# Patient Record
Sex: Female | Born: 1975
Health system: Southern US, Community
[De-identification: ages and names within clinical notes are randomized; demographics above are authoritative.]

## PROBLEM LIST (undated history)

## (undated) ENCOUNTER — Inpatient Hospital Stay: Admission: EM | Payer: Self-pay | Source: Home / Self Care

## (undated) DIAGNOSIS — E039 Hypothyroidism, unspecified: Secondary | ICD-10-CM

## (undated) DIAGNOSIS — E669 Obesity, unspecified: Secondary | ICD-10-CM

## (undated) DIAGNOSIS — M545 Low back pain, unspecified: Secondary | ICD-10-CM

## (undated) DIAGNOSIS — D649 Anemia, unspecified: Secondary | ICD-10-CM

## (undated) DIAGNOSIS — I669 Occlusion and stenosis of unspecified cerebral artery: Secondary | ICD-10-CM

## (undated) HISTORY — DX: Obesity, unspecified: E66.9

## (undated) HISTORY — PX: BREAST BIOPSY: SHX20

## (undated) HISTORY — DX: Low back pain: M54.5

## (undated) HISTORY — DX: Low back pain, unspecified: M54.50

## (undated) HISTORY — DX: Hypothyroidism, unspecified: E03.9

## (undated) HISTORY — DX: Anemia, unspecified: D64.9

## (undated) HISTORY — DX: Occlusion and stenosis of unspecified cerebral artery: I66.9

## (undated) HISTORY — PX: COLONOSCOPY: SHX174

---

## 2000-07-19 HISTORY — PX: OTHER SURGICAL HISTORY: SHX169

## 2002-07-19 DIAGNOSIS — I669 Occlusion and stenosis of unspecified cerebral artery: Secondary | ICD-10-CM

## 2002-07-19 HISTORY — PX: IVC FILTER PLACEMENT (ARMC HX): HXRAD1551

## 2002-07-19 HISTORY — DX: Occlusion and stenosis of unspecified cerebral artery: I66.9

## 2006-07-19 HISTORY — PX: OTHER SURGICAL HISTORY: SHX169

## 2016-03-15 ENCOUNTER — Encounter: Payer: Self-pay | Admitting: Hematology

## 2016-03-15 ENCOUNTER — Telehealth: Payer: Self-pay | Admitting: Hematology

## 2016-03-15 NOTE — Telephone Encounter (Signed)
Pt confirmed appt, completed in take, mailed new pt letter, faxed referring provider appt date/time

## 2016-04-06 ENCOUNTER — Telehealth: Payer: Self-pay | Admitting: Hematology

## 2016-04-06 NOTE — Telephone Encounter (Signed)
COVERING AP - NEW PT APPT MOVED FROM 9/20 TO 10/2. SPOKE WITH PATIENT SHE IS AWARE. CONFIRMED W/NEW PT NAVIGATOR OK TO MOVE.

## 2016-04-07 ENCOUNTER — Ambulatory Visit: Payer: Medicaid Other | Admitting: Hematology

## 2016-04-19 ENCOUNTER — Telehealth: Payer: Self-pay | Admitting: Hematology

## 2016-04-19 ENCOUNTER — Encounter: Payer: Self-pay | Admitting: Hematology

## 2016-04-19 ENCOUNTER — Ambulatory Visit (HOSPITAL_BASED_OUTPATIENT_CLINIC_OR_DEPARTMENT_OTHER): Payer: Medicaid Other | Admitting: Hematology

## 2016-04-19 ENCOUNTER — Ambulatory Visit (HOSPITAL_BASED_OUTPATIENT_CLINIC_OR_DEPARTMENT_OTHER): Payer: Medicaid Other

## 2016-04-19 VITALS — BP 105/61 | HR 84 | Temp 98.2°F | Resp 18 | Wt 295.0 lb

## 2016-04-19 DIAGNOSIS — D509 Iron deficiency anemia, unspecified: Secondary | ICD-10-CM

## 2016-04-19 DIAGNOSIS — F411 Generalized anxiety disorder: Secondary | ICD-10-CM

## 2016-04-19 DIAGNOSIS — D693 Immune thrombocytopenic purpura: Secondary | ICD-10-CM

## 2016-04-19 DIAGNOSIS — Z86711 Personal history of pulmonary embolism: Secondary | ICD-10-CM

## 2016-04-19 DIAGNOSIS — E538 Deficiency of other specified B group vitamins: Secondary | ICD-10-CM

## 2016-04-19 DIAGNOSIS — D696 Thrombocytopenia, unspecified: Secondary | ICD-10-CM | POA: Insufficient documentation

## 2016-04-19 DIAGNOSIS — E039 Hypothyroidism, unspecified: Secondary | ICD-10-CM | POA: Insufficient documentation

## 2016-04-19 DIAGNOSIS — M1990 Unspecified osteoarthritis, unspecified site: Secondary | ICD-10-CM | POA: Insufficient documentation

## 2016-04-19 LAB — COMPREHENSIVE METABOLIC PANEL
ALBUMIN: 3.5 g/dL (ref 3.5–5.0)
ALT: 21 U/L (ref 0–55)
AST: 18 U/L (ref 5–34)
Alkaline Phosphatase: 103 U/L (ref 40–150)
Anion Gap: 10 mEq/L (ref 3–11)
BUN: 10.6 mg/dL (ref 7.0–26.0)
CALCIUM: 9.2 mg/dL (ref 8.4–10.4)
CHLORIDE: 106 meq/L (ref 98–109)
CO2: 24 mEq/L (ref 22–29)
CREATININE: 0.7 mg/dL (ref 0.6–1.1)
EGFR: >90 mL/min/{1.73_m2} (ref >90)
GLUCOSE: 75 mg/dL (ref 70–140)
POTASSIUM: 3.9 meq/L (ref 3.5–5.1)
SODIUM: 140 meq/L (ref 136–145)
Total Bilirubin: 0.44 mg/dL (ref 0.20–1.20)
Total Protein: 7.9 g/dL (ref 6.4–8.3)

## 2016-04-19 LAB — CBC & DIFF AND RETIC
BASO%: 0.2 % (ref 0.0–2.0)
BASOS ABS: 0 10*3/uL (ref 0.0–0.1)
EOS%: 1.1 % (ref 0.0–7.0)
Eosinophils Absolute: 0.1 10*3/uL (ref 0.0–0.5)
HCT: 32.9 % — ABNORMAL LOW (ref 34.8–46.6)
HGB: 11.1 g/dL — ABNORMAL LOW (ref 11.6–15.9)
Immature Retic Fract: 7 % (ref 1.60–10.00)
LYMPH#: 1.6 10*3/uL (ref 0.9–3.3)
LYMPH%: 25 % (ref 14.0–49.7)
MCH: 24.4 pg — ABNORMAL LOW (ref 25.1–34.0)
MCHC: 33.7 g/dL (ref 31.5–36.0)
MCV: 72.3 fL — ABNORMAL LOW (ref 79.5–101.0)
MONO#: 0.5 10*3/uL (ref 0.1–0.9)
MONO%: 7.9 % (ref 0.0–14.0)
NEUT%: 65.8 % (ref 38.4–76.8)
NEUTROS ABS: 4.1 10*3/uL (ref 1.5–6.5)
Platelets: 33 10*3/uL — ABNORMAL LOW (ref 145–400)
RBC: 4.55 10*6/uL (ref 3.70–5.45)
RDW: 14 % (ref 11.2–14.5)
RETIC %: 1.35 % (ref 0.70–2.10)
RETIC CT ABS: 61.43 10*3/uL (ref 33.70–90.70)
WBC: 6.2 10*3/uL (ref 3.9–10.3)

## 2016-04-19 LAB — CHCC SMEAR

## 2016-04-19 MED ORDER — ELTROMBOPAG OLAMINE 25 MG PO TABS: 25.0000 mg | ORAL_TABLET | Freq: Every day | ORAL | 1 refills | Status: DC

## 2016-04-19 MED ORDER — LEVOTHYROXINE SODIUM 100 MCG PO TABS: 100.0000 ug | ORAL_TABLET | Freq: Every day | ORAL | 1 refills | Status: DC

## 2016-04-19 NOTE — Telephone Encounter (Signed)
GAVE PATIENT AVS REPORT AND APPOINTMENTS FOR October  °

## 2016-04-19 NOTE — Progress Notes (Signed)
HEMATOLOGY/ONCOLOGY CONSULTATION NOTE  Date of Service: 04/19/2016  PCP: [facility] COMPLAINTS/PURPOSE OF CONSULTATION:  Chronic ITP  HISTORY OF PRESENTING ILLNESS:   Kiara Barrera is a wonderful 40 y.o. 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  Patient was been managed for her chronic ITP by Dr Yvone Neu at [facility] in [place], [place] and recently moved to [place] and wants to establish care here.  As per review of outside records the patient was diagnosed with ITP since 2012 in [place] and has been treated with prednisone on off since then. She apparently had significant weight gain with prednisone and had chronically low platelets despite the prednisone. She was then started on Promacta 50 mg by mouth daily in June 2016 and had a very good response with platelet counts of more than 500k. Her Promacta was discontinued in July 2016. She had relapse of her ITP in September 2016 with platelet count was down to 49k any significant bleeding or bruising. He has been on variable doses of Promacta since then and reports that she is currently taking 25 mg on Monday Wednesday and Friday.  He has had previous iron deficiency due to heavy periods which was treated with oral iron replacement.  MEDICAL HISTORY:  #1 chronic ITP #2 Iron deficiency anemia due to heavy periods #3 hypothyroidism #4 degenerative arthritis #5 anxiety disorder #6 history of pulmonary embolism in 2004 status post IVC filter placement. 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 #7 migraine headaches  SURGICAL HISTORY: Past Surgical History:  Procedure Laterality Date  . C secton  2002  . IVC FILTER PLACEMENT ([place] HX)  2004  . Left axillary  lymph node excision biopsy  2008    SOCIAL HISTORY: Social History   Social History  . Marital status: Single    Spouse name: N/A  . Number of children: N/A  . Years of education: N/A   Occupational History  . Not on file.   Social History Main Topics  . Smoking status: Never Smoker  . Smokeless tobacco: Never Used  . Alcohol use Yes     Comment: occasional: not that often  . Drug use: No  . Sexual activity: Not on file   Other Topics Concern  . Not on file   Social History Narrative  . No narrative on file  Recently moved from [place] , [place] to [place]. Nonsmoker minimal social alcohol use no drug use Currently unemployed Has a 7 year old daughter  FAMILY HISTORY: No known history of blood disorders Mother had a history of cervical cancer  maternal grandmother diabetes type 2 Maternal uncle diabetes type 2  ALLERGIES:  has No Known Allergies.  MEDICATIONS:  Current Outpatient Prescriptions  Medication Sig Dispense Refill  . Biotin 10000 MCG TABS Take by mouth.    . Calcium Carb-Cholecalciferol (CALCIUM + D3) 600-800 MG-UNIT TABS Take by mouth.    . Cholecalciferol (VITAMIN D3) 2000 units TABS Take by mouth.    . eltrombopag (PROMACTA) 25 MG tablet Take 25 mg by mouth daily. Take on an empty stomach, 1 hour before a meal or 2 hours after.    . IRON PO Take 65 mg by mouth.    . Multiple Vitamin (MULTIVITAMIN)  tablet Take 1 tablet by mouth daily.    Marland Kitchen levothyroxine (SYNTHROID, LEVOTHROID) 100 MCG tablet TK 1 T PO QD  0   No current facility-administered medications for this visit.     REVIEW OF SYSTEMS:    10 Point review of Systems was done is negative except as noted above.  PHYSICAL EXAMINATION: ECOG PERFORMANCE STATUS: 1 - Symptomatic but completely ambulatory  . Vitals:   04/19/16 1259  BP: 105/61  Pulse: 84  Resp: 18  Temp: 98.2 F (36.8 C)   Filed Weights   04/19/16 1259  Weight: 295 lb (133.8 kg)   .There is no height or weight  on file to calculate BMI.  GENERAL:alert, in no acute distress and comfortable SKIN: skin color, texture, turgor are normal, no rashes or significant lesions EYES: normal, conjunctiva are pink and non-injected, sclera clear OROPHARYNX:no exudate, no erythema and lips, buccal mucosa, and tongue normal  NECK: supple, no JVD, thyroid normal size, non-tender, without nodularity LYMPH:  no palpable lymphadenopathy in the cervical, axillary or inguinal LUNGS: clear to auscultation with normal respiratory effort HEART: regular rate & rhythm,  no murmurs and no lower extremity edema ABDOMEN: abdomen soft, non-tender, normoactive bowel sounds  Musculoskeletal: no cyanosis of digits and no clubbing  PSYCH: alert & oriented x 3 with fluent speech NEURO: no focal motor/sensory deficits  LABORATORY DATA:  I have reviewed the data as listed  Component     Latest Ref Rng & Units 04/19/2016  WBC     3.9 - 10.3 10e3/uL 6.2  NEUT#     1.5 - 6.5 10e3/uL 4.1  Hemoglobin     11.6 - 15.9 g/dL 11.1 (L)  HCT     34.8 - 46.6 % 32.9 (L)  Platelets     145 - 400 10e3/uL 33 Large platelets present (L)  MCV     79.5 - 101.0 fL 72.3 (L)  MCH     25.1 - 34.0 pg 24.4 (L)  MCHC     31.5 - 36.0 g/dL 33.7  RBC     3.70 - 5.45 10e6/uL 4.55  RDW     11.2 - 14.5 % 14.0  lymph#     0.9 - 3.3 10e3/uL 1.6  MONO#     0.1 - 0.9 10e3/uL 0.5  Eosinophils Absolute     0.0 - 0.5 10e3/uL 0.1  Basophils Absolute     0.0 - 0.1 10e3/uL 0.0  NEUT%     38.4 - 76.8 % 65.8  LYMPH%     14.0 - 49.7 % 25.0  MONO%     0.0 - 14.0 % 7.9  EOS%     0.0 - 7.0 % 1.1  BASO%     0.0 - 2.0 % 0.2  Retic %     0.70 - 2.10 % 1.35  Retic Ct Abs     33.70 - 90.70 10e3/uL 61.43  Immature Retic Fract     1.60 - 10.00 % 7.00  Sodium     136 - 145 mEq/L 140  Potassium     3.5 - 5.1 mEq/L 3.9  Chloride     98 - 109 mEq/L 106  CO2     22 - 29 mEq/L 24  Glucose     70 - 140 mg/dl 75  BUN     7.0 - 26.0 mg/dL 10.6    Creatinine     0.6 - 1.1 mg/dL 0.7  Total Bilirubin     0.20 -  1.20 mg/dL 0.44  Alkaline Phosphatase     40 - 150 U/L 103  AST     5 - 34 U/L 18  ALT     0 - 55 U/L 21  Total Protein     6.4 - 8.3 g/dL 7.9  Albumin     3.5 - 5.0 g/dL 3.5  Calcium     8.4 - 10.4 mg/dL 9.2  Anion gap     3 - 11 mEq/L 10  EGFR     >90 ml/min/1.73 m2 >90  Iron     41 - 142 ug/dL 27 (L)  TIBC     236 - 444 ug/dL 430  UIBC     120 - 384 ug/dL 403 (H)  %SAT     21 - 57 % 6 (L)  Ferritin     9 - 269 ng/ml 30  Vitamin B12     211 - 946 pg/mL 623  Technologist Review          RADIOGRAPHIC STUDIES: I have personally reviewed the radiological images as listed and agreed with the findings in the report. No results found.  ASSESSMENT & PLAN:   40 year old African-American female with   1) Chronic ITP since 2012. Patient has been on and off steroids and has been on promacta since 2016. Her platelets today are down to 33k. Was was apparently on Promacta 98m po MWF. No issues with bleeding at this time. No petechiae B12 wnl Plan -absolutely avoid NSAIDS, ASA and similar products -given prescription for promacta 272mpo daily. - if still low might need to consider other treatments including Rituxan or splenectomy.  2) Iron deficiency Anemia - likely from heavy menstrual losses. Plan -maintain platelet count >50k -continue po Iron replacement. -might need to consider IV iron if worsening anemia.  3) Hypothyroidism - on levothyroxine -given refill on her prescription since she does not yet have a PCP.  4) . Patient Active Problem List   Diagnosis Date Noted  . Chronic ITP (idiopathic thrombocytopenia) (HCC) 05/03/2016  . Thrombocytopenia ([facility]/08/2015  . Iron deficiency anemia 04/19/2016  . Hypothyroidism 04/19/2016  . Degenerative arthritis 04/19/2016  . Generalized anxiety disorder 04/19/2016   - she has been advised to get a PCP ASAP for management of all her other  medical cares.  5) Obesity  -discussed lifestyle modifications including need for appropriate diet and exercise.  6) H/o PE in 2004-- still has IVC filter in situ. Thought to be triggered by immobility. -not on anticoagulation at this time.  RTC with Dr KaIrene Limbon 2weeks with rpt labs   All of the patients questions were answered with apparent satisfaction. The patient knows to call the clinic with any problems, questions or concerns.  I spent 50 minutes counseling the patient face to face. The total time spent in the appointment was 60 minutes and more than 50% was on counseling and direct patient cares.    GaSullivan LoneD MSBlue SkyAHIVMS SCGuadalupe County HospitalTBeacon Orthopaedics Surgery Centerematology/Oncology Physician CoPacific Northwest Urology Surgery Center(Office):       [phone] cell):  [phone]):           [phone]/08/2015 2:40 PM

## 2016-04-20 LAB — IRON AND TIBC
%SAT: 6 % — AB (ref 21–57)
Iron: 27 ug/dL — ABNORMAL LOW (ref 41–142)
TIBC: 430 ug/dL (ref 236–444)
UIBC: 403 ug/dL — AB (ref 120–384)

## 2016-04-20 LAB — VITAMIN B12: VITAMIN B 12: 623 pg/mL (ref 211–946)

## 2016-04-20 LAB — FERRITIN: Ferritin: 30 ng/ml (ref 9–269)

## 2016-05-03 ENCOUNTER — Ambulatory Visit (HOSPITAL_BASED_OUTPATIENT_CLINIC_OR_DEPARTMENT_OTHER): Payer: Medicaid Other | Admitting: Hematology

## 2016-05-03 ENCOUNTER — Other Ambulatory Visit (HOSPITAL_BASED_OUTPATIENT_CLINIC_OR_DEPARTMENT_OTHER): Payer: Medicaid Other

## 2016-05-03 ENCOUNTER — Encounter: Payer: Self-pay | Admitting: Hematology

## 2016-05-03 ENCOUNTER — Other Ambulatory Visit: Payer: Self-pay | Admitting: *Deleted

## 2016-05-03 ENCOUNTER — Ambulatory Visit: Payer: Medicaid Other

## 2016-05-03 VITALS — BP 102/49 | HR 98 | Temp 97.6°F | Resp 18 | Ht 62.0 in | Wt 301.1 lb

## 2016-05-03 DIAGNOSIS — D6959 Other secondary thrombocytopenia: Secondary | ICD-10-CM

## 2016-05-03 DIAGNOSIS — D509 Iron deficiency anemia, unspecified: Secondary | ICD-10-CM

## 2016-05-03 DIAGNOSIS — D693 Immune thrombocytopenic purpura: Secondary | ICD-10-CM

## 2016-05-03 LAB — CBC & DIFF AND RETIC
BASO%: 0.3 % (ref 0.0–2.0)
Basophils Absolute: 0 10*3/uL (ref 0.0–0.1)
EOS%: 0.7 % (ref 0.0–7.0)
Eosinophils Absolute: 0 10*3/uL (ref 0.0–0.5)
HCT: 32 % — ABNORMAL LOW (ref 34.8–46.6)
HGB: 10.1 g/dL — ABNORMAL LOW (ref 11.6–15.9)
IMMATURE RETIC FRACT: 10.2 % — AB (ref 1.60–10.00)
LYMPH#: 1.7 10*3/uL (ref 0.9–3.3)
LYMPH%: 24.8 % (ref 14.0–49.7)
MCH: 23.4 pg — ABNORMAL LOW (ref 25.1–34.0)
MCHC: 31.7 g/dL (ref 31.5–36.0)
MCV: 73.6 fL — AB (ref 79.5–101.0)
MONO#: 0.5 10*3/uL (ref 0.1–0.9)
MONO%: 7.1 % (ref 0.0–14.0)
NEUT%: 67.1 % (ref 38.4–76.8)
NEUTROS ABS: 4.7 10*3/uL (ref 1.5–6.5)
Platelets: 8 10*3/uL — CL (ref 145–400)
RBC: 4.34 10*6/uL (ref 3.70–5.45)
RDW: 15.4 % — ABNORMAL HIGH (ref 11.2–14.5)
RETIC CT ABS: 82.03 10*3/uL (ref 33.70–90.70)
Retic %: 1.89 % (ref 0.70–2.10)
WBC: 7 10*3/uL (ref 3.9–10.3)

## 2016-05-03 LAB — COMPREHENSIVE METABOLIC PANEL
ALT: 25 U/L (ref 0–55)
AST: 24 U/L (ref 5–34)
Albumin: 3.3 g/dL — ABNORMAL LOW (ref 3.5–5.0)
Alkaline Phosphatase: 93 U/L (ref 40–150)
Anion Gap: 10 mEq/L (ref 3–11)
BILIRUBIN TOTAL: 0.31 mg/dL (ref 0.20–1.20)
BUN: 13.4 mg/dL (ref 7.0–26.0)
CHLORIDE: 106 meq/L (ref 98–109)
CO2: 24 meq/L (ref 22–29)
Calcium: 8.8 mg/dL (ref 8.4–10.4)
Creatinine: 0.7 mg/dL (ref 0.6–1.1)
GLUCOSE: 81 mg/dL (ref 70–140)
Potassium: 4 mEq/L (ref 3.5–5.1)
SODIUM: 139 meq/L (ref 136–145)
TOTAL PROTEIN: 7.4 g/dL (ref 6.4–8.3)

## 2016-05-03 LAB — IRON AND TIBC
%SAT: 7 % — ABNORMAL LOW (ref 21–57)
Iron: 30 ug/dL — ABNORMAL LOW (ref 41–142)
TIBC: 433 ug/dL (ref 236–444)
UIBC: 402 ug/dL — AB (ref 120–384)

## 2016-05-03 LAB — TECHNOLOGIST REVIEW

## 2016-05-03 LAB — FERRITIN: FERRITIN: 41 ng/mL (ref 9–269)

## 2016-05-03 MED ORDER — DEXAMETHASONE 4 MG PO TABS: 40.0000 mg | ORAL_TABLET | Freq: Every day | ORAL | 0 refills | Status: AC

## 2016-05-03 MED ORDER — ROMIPLOSTIM INJECTION 500 MCG
2.0000 ug/kg | Freq: Once | SUBCUTANEOUS | Status: AC
Start: 2016-05-03 — End: 2016-05-03
  Administered 2016-05-03: 275 ug via SUBCUTANEOUS
  Filled 2016-05-03: qty 0.55

## 2016-05-03 NOTE — Progress Notes (Signed)
Spoke with Otho Perl (authorization specialist). Luann informed RN that Nplate does not need to be authorized with Medicaid.

## 2016-05-03 NOTE — Progress Notes (Signed)
HEMATOLOGY/ONCOLOGY CLINIC NOTE  Date of Service: 05/05/2016  PCP: [facility] COMPLAINTS/PURPOSE OF CONSULTATION:  Chronic ITP now with relapse  HISTORY OF PRESENTING ILLNESS:   Alliannah Moskovitz is a wonderful 40 y.o. female who has been referred to [place] by [facility] for evaluation and management of Chronic ITP.  Patient was been managed for her chronic ITP by Dr Yvone Neu at [facility] in [place], [place] and recently moved to [place] and wants to establish care here.  As per review of outside records the patient was diagnosed with ITP since 2012 in [place] and has been treated with prednisone on off since then. She apparently had significant weight gain with prednisone and had chronically low platelets despite the prednisone. She was then started on Promacta 50 mg by mouth daily in June 2016 and had a very good response with platelet counts of more than 500k. Her Promacta was discontinued in July 2016. She had relapse of her ITP in September 2016 with platelet count was down to 49k any significant bleeding or bruising. He has been on variable doses of Promacta since then and reports that she is currently taking 25 mg on Monday Wednesday and Friday.  He has had previous iron deficiency due to heavy periods which was treated with oral iron replacement.  INTERVAL HISTORY  Ms Minervini is here for followup of her ITP. She notes no issues with bleeding. She reports that due to insurance issues she was unable to get the promacta refilled yet and was using only 25mg  po MWF as per previous orders and not 25mg  po daily as we ordered. Her platelet counts are down to 8K today. No overt bleeding. No headaches. Notes that she is just starting to have her periods. We discussed treatment options for her severe relapse and she was offered hospitalization for acute rx of severe ITP  relapse with IVIG. She refused admission since her minor 27yr daughter is home alone.  MEDICAL HISTORY:  #1 chronic ITP #2 Iron deficiency anemia due to heavy periods #3 hypothyroidism #4 degenerative arthritis #5 anxiety disorder #6 history of pulmonary embolism in 2004 status post IVC filter placement. 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 #7 migraine headaches  SURGICAL HISTORY: Past Surgical History:  Procedure Laterality Date  . C secton  2002  . IVC FILTER PLACEMENT ([place] HX)  2004  . Left axillary lymph node excision biopsy  2008    SOCIAL HISTORY: Social History   Social History  . Marital status: Single    Spouse name: N/A  . Number of children: N/A  . Years of education: N/A   Occupational History  . Not on file.   Social History Main Topics  . Smoking status: Never Smoker  . Smokeless tobacco: Never Used  . Alcohol use Yes     Comment: occasional: not that often  . Drug use: No  . Sexual activity: Not on file   Other Topics Concern  . Not on file   Social History Narrative  . No narrative on file  Recently moved from [place] , [place] to [place]. Nonsmoker minimal social alcohol use no drug use Currently unemployed Has a 4 year old daughter  FAMILY HISTORY: No known history of blood disorders Mother had a history of cervical cancer  maternal grandmother diabetes type 2 Maternal uncle diabetes type 2  ALLERGIES:  has No Known Allergies.  MEDICATIONS:  Current Outpatient Prescriptions  Medication Sig Dispense Refill  . dexamethasone (DECADRON) 4 MG tablet Take 10 tablets (40 mg total) by mouth daily. Take with breakfast not on empty stomach 40 tablet 0  . eltrombopag (PROMACTA) 25 MG tablet Take 1 tablet (25 mg total) by mouth daily. Take on an empty stomach, 1 hour before a meal or 2 hours after. 30 tablet 1  . IRON PO Take 65 mg by  mouth.    . levothyroxine (SYNTHROID, LEVOTHROID) 100 MCG tablet Take 1 tablet (100 mcg total) by mouth daily before breakfast. 30 tablet 1  . Multiple Vitamin (MULTIVITAMIN) tablet Take 1 tablet by mouth daily.     No current facility-administered medications for this visit.     REVIEW OF SYSTEMS:    10 Point review of Systems was done is negative except as noted above.  PHYSICAL EXAMINATION: ECOG PERFORMANCE STATUS: 1 - Symptomatic but completely ambulatory  . Vitals:   05/03/16 0952  BP: (!) 102/49  Pulse: 98  Resp: 18  Temp: 97.6 F (36.4 C)   Filed Weights   05/03/16 0952  Weight: (!) 301 lb 1.6 oz (136.6 kg)   .Body mass index is 55.07 kg/m.  GENERAL:alert, in no acute distress and comfortable SKIN: skin color, texture, turgor are normal, no rashes or significant lesions EYES: normal, conjunctiva are pink and non-injected, sclera clear OROPHARYNX:no exudate, no erythema and lips, buccal mucosa, and tongue normal  NECK: supple, no JVD, thyroid normal size, non-tender, without nodularity LYMPH:  no palpable lymphadenopathy in the cervical, axillary or inguinal LUNGS: clear to auscultation with normal respiratory effort HEART: regular rate & rhythm,  no murmurs and no lower extremity edema ABDOMEN: abdomen soft, non-tender, normoactive bowel sounds  Musculoskeletal: no cyanosis of digits and no clubbing  PSYCH: alert & oriented x 3 with fluent speech NEURO: no focal motor/sensory deficits  LABORATORY DATA:  I have reviewed the data as listed  . CBC Latest Ref Rng & Units 05/03/2016 04/19/2016  WBC 3.9 - 10.3 10e3/uL 7.0 6.2  Hemoglobin 11.6 - 15.9 g/dL 10.1(L) 11.1(L)  Hematocrit 34.8 - 46.6 % 32.0(L) 32.9(L)  Platelets 145 - 400 10e3/uL 8 Large platelets present(LL) 33 Large platelets present(L)   . CMP Latest Ref Rng & Units 05/03/2016 04/19/2016  Glucose 70 - 140 mg/dl 81 75  BUN 7.0 - 26.0 mg/dL 13.4 10.6  Creatinine 0.6 - 1.1 mg/dL 0.7 0.7  Sodium 136 -  145 mEq/L 139 140  Potassium 3.5 - 5.1 mEq/L 4.0 3.9  CO2 22 - 29 mEq/L 24 24  Calcium 8.4 - 10.4 mg/dL 8.8 9.2  Total Protein 6.4 - 8.3 g/dL 7.4 7.9  Total Bilirubin 0.20 - 1.20 mg/dL 0.31 0.44  Alkaline Phos 40 - 150 U/L 93 103  AST 5 - 34 U/L 24 18  ALT 0 - 55 U/L 25 21    RADIOGRAPHIC STUDIES: I have personally reviewed the radiological images as listed and agreed with the findings in the report. No results found.  ASSESSMENT & PLAN:   40 year old African-American female with   1) Chronic ITP since 2012 now with acute relapse of her ITP with platelet counts down to 8k. No overt bleeding at this time. Patient has been on and off steroids and has been on promacta since 2016. Was was apparently on Promacta 25mg  po MWF. No issues with bleeding at this  time. No petechiae B12 wnl Plan -patient was recommended hospitalization for acute treatment of her severe ITP. She declined this options since her minor 39yo is home alone. -we was started on Dexamethasone 40mg  po daily x 4 days with breakfast. -we was setup and received Nplate 74mcg/kg today and setup for weekly doses. -she has been setup for outpatient IVIG on 05/11/216. Will cancel is platelets >30k. -absolutely avoid NSAIDS, ASA and similar products -hold off on promacta at this time might consider transition back to this if platelets stabilize. -patient recommended to seek immediate attention for any concerns of active bleeding since she might need platelet transfusion and other treatment. -we shall repeat cbc on Friday 05/07/2016. - if still low might need to consider other treatments including Rituxan or splenectomy. -[place] abd to check for liver pathology and spleen size.  2) Iron deficiency Anemia - likely from heavy menstrual losses. Plan -continue po Iron replacement. -might need to consider IV iron if worsening anemia.  3) Hypothyroidism - on levothyroxine  4) . Patient Active Problem List   Diagnosis Date Noted    . Chronic ITP (idiopathic thrombocytopenia) (HCC) 05/03/2016  . Thrombocytopenia ([facility]) 04/19/2016  . Iron deficiency anemia 04/19/2016  . Hypothyroidism 04/19/2016  . Degenerative arthritis 04/19/2016  . Generalized anxiety disorder 04/19/2016   - she has been advised to get a PCP ASAP for management of all her other medical cares.  5) Obesity  -discussed lifestyle modifications including need for appropriate diet and exercise.  6) H/o PE in 2004-- still has IVC filter in situ. Thought to be triggered by immobility. -not on anticoagulation at this time.  RTC with Dr Irene Limbo in 1week with rpt labs and labs only on 05/07/2016   All of the patients questions were answered with apparent satisfaction. The patient knows to call the clinic with any problems, questions or concerns.  I spent 30 minutes counseling the patient face to face. The total time spent in the appointment was 40 minutes and more than 50% was on counseling and direct patient cares.    Sullivan Lone MD Peter AAHIVMS [facility] Hematology/Oncology Physician [facility]  (Office):       [phone] (Work cell):  [phone] (Fax):           [phone]

## 2016-05-06 ENCOUNTER — Other Ambulatory Visit: Payer: Self-pay | Admitting: *Deleted

## 2016-05-06 DIAGNOSIS — D693 Immune thrombocytopenic purpura: Secondary | ICD-10-CM

## 2016-05-06 MED ORDER — ELTROMBOPAG OLAMINE 25 MG PO TABS: 25.0000 mg | ORAL_TABLET | Freq: Every day | ORAL | 1 refills | Status: DC

## 2016-05-07 ENCOUNTER — Other Ambulatory Visit (HOSPITAL_BASED_OUTPATIENT_CLINIC_OR_DEPARTMENT_OTHER): Payer: Medicaid Other

## 2016-05-07 DIAGNOSIS — D693 Immune thrombocytopenic purpura: Secondary | ICD-10-CM | POA: Diagnosis not present

## 2016-05-07 LAB — CBC & DIFF AND RETIC
BASO%: 0 % (ref 0.0–2.0)
BASOS ABS: 0 10*3/uL (ref 0.0–0.1)
EOS ABS: 0 10*3/uL (ref 0.0–0.5)
EOS%: 0 % (ref 0.0–7.0)
HCT: 30.4 % — ABNORMAL LOW (ref 34.8–46.6)
HEMOGLOBIN: 10.2 g/dL — AB (ref 11.6–15.9)
Immature Retic Fract: 14.9 % — ABNORMAL HIGH (ref 1.60–10.00)
LYMPH#: 1.6 10*3/uL (ref 0.9–3.3)
LYMPH%: 6.7 % — ABNORMAL LOW (ref 14.0–49.7)
MCH: 24.3 pg — AB (ref 25.1–34.0)
MCHC: 33.6 g/dL (ref 31.5–36.0)
MCV: 72.4 fL — ABNORMAL LOW (ref 79.5–101.0)
MONO#: 1.5 10*3/uL — AB (ref 0.1–0.9)
MONO%: 6 % (ref 0.0–14.0)
NEUT#: 21.3 10*3/uL — ABNORMAL HIGH (ref 1.5–6.5)
NEUT%: 87.3 % — ABNORMAL HIGH (ref 38.4–76.8)
NRBC: 0 % (ref 0–0)
Platelets: 285 10*3/uL (ref 145–400)
RBC: 4.2 10*6/uL (ref 3.70–5.45)
RDW: 15.9 % — AB (ref 11.2–14.5)
RETIC %: 3.42 % — AB (ref 0.70–2.10)
RETIC CT ABS: 143.64 10*3/uL — AB (ref 33.70–90.70)
WBC: 24.4 10*3/uL — ABNORMAL HIGH (ref 3.9–10.3)

## 2016-05-10 ENCOUNTER — Telehealth: Payer: Self-pay | Admitting: Hematology

## 2016-05-10 ENCOUNTER — Ambulatory Visit: Payer: Medicaid Other

## 2016-05-10 ENCOUNTER — Encounter: Payer: Self-pay | Admitting: *Deleted

## 2016-05-10 ENCOUNTER — Ambulatory Visit: Payer: Medicaid Other | Admitting: Family

## 2016-05-10 NOTE — Telephone Encounter (Signed)
Late documentation.  Patient given avs report and appointments for October and November prior to leaving 10/16.

## 2016-05-10 NOTE — Progress Notes (Signed)
RN informed patient of recent platelet count. Cancelled infusion for tomorrow. Informed patient that we will give NPlate if plt level is below 100 per MD Irene Limbo.

## 2016-05-11 ENCOUNTER — Ambulatory Visit: Payer: Medicaid Other

## 2016-05-11 ENCOUNTER — Other Ambulatory Visit: Payer: Medicaid Other

## 2016-05-17 ENCOUNTER — Ambulatory Visit: Payer: Medicaid Other | Admitting: Hematology

## 2016-05-17 ENCOUNTER — Ambulatory Visit (HOSPITAL_BASED_OUTPATIENT_CLINIC_OR_DEPARTMENT_OTHER): Payer: Medicaid Other

## 2016-05-17 ENCOUNTER — Other Ambulatory Visit (HOSPITAL_BASED_OUTPATIENT_CLINIC_OR_DEPARTMENT_OTHER): Payer: Medicaid Other

## 2016-05-17 ENCOUNTER — Other Ambulatory Visit: Payer: Self-pay | Admitting: *Deleted

## 2016-05-17 VITALS — BP 129/79 | HR 68 | Temp 97.6°F | Resp 20

## 2016-05-17 DIAGNOSIS — D693 Immune thrombocytopenic purpura: Secondary | ICD-10-CM | POA: Diagnosis present

## 2016-05-17 LAB — CBC & DIFF AND RETIC
BASO%: 0.1 % (ref 0.0–2.0)
Basophils Absolute: 0 10*3/uL (ref 0.0–0.1)
EOS ABS: 0.1 10*3/uL (ref 0.0–0.5)
EOS%: 0.4 % (ref 0.0–7.0)
HCT: 32.4 % — ABNORMAL LOW (ref 34.8–46.6)
HGB: 10.9 g/dL — ABNORMAL LOW (ref 11.6–15.9)
IMMATURE RETIC FRACT: 17.9 % — AB (ref 1.60–10.00)
LYMPH#: 2.4 10*3/uL (ref 0.9–3.3)
LYMPH%: 17.9 % (ref 14.0–49.7)
MCH: 24.2 pg — ABNORMAL LOW (ref 25.1–34.0)
MCHC: 33.6 g/dL (ref 31.5–36.0)
MCV: 72 fL — AB (ref 79.5–101.0)
MONO#: 0.9 10*3/uL (ref 0.1–0.9)
MONO%: 6.7 % (ref 0.0–14.0)
NEUT%: 74.9 % (ref 38.4–76.8)
NEUTROS ABS: 9.9 10*3/uL — AB (ref 1.5–6.5)
NRBC: 0 % (ref 0–0)
Platelets: 13 10*3/uL — ABNORMAL LOW (ref 145–400)
RBC: 4.5 10*6/uL (ref 3.70–5.45)
RDW: 15.8 % — AB (ref 11.2–14.5)
RETIC %: 1.88 % (ref 0.70–2.10)
RETIC CT ABS: 84.6 10*3/uL (ref 33.70–90.70)
WBC: 13.2 10*3/uL — AB (ref 3.9–10.3)

## 2016-05-17 MED ORDER — ROMIPLOSTIM INJECTION 500 MCG
410.0000 ug | Freq: Once | SUBCUTANEOUS | Status: AC
  Administered 2016-05-17: 410 ug via SUBCUTANEOUS
  Filled 2016-05-17: qty 0.82

## 2016-05-17 NOTE — Patient Instructions (Signed)
Romiplostim injection What is this medicine? ROMIPLOSTIM (roe mi PLOE stim) helps your body make more platelets. This medicine is used to treat low platelets caused by chronic idiopathic thrombocytopenic purpura (ITP). This medicine may be used for other purposes; ask your health care provider or pharmacist if you have questions. What should I tell my health care provider before I take this medicine? They need to know if you have any of these conditions: -cancer or myelodysplastic syndrome -low blood counts, like low white cell, platelet, or red cell counts -take medicines that treat or prevent blood clots -an unusual or allergic reaction to romiplostim, mannitol, other medicines, foods, dyes, or preservatives -pregnant or trying to get pregnant -breast-feeding How should I use this medicine? This medicine is for injection under the skin. It is given by a health care professional in a hospital or clinic setting. A special MedGuide will be given to you before your injection. Read this information carefully each time. Talk to your pediatrician regarding the use of this medicine in children. Special care may be needed. Overdosage: If you think you have taken too much of this medicine contact a poison control center or emergency room at once. NOTE: This medicine is only for you. Do not share this medicine with others. What if I miss a dose? It is important not to miss your dose. Call your doctor or health care professional if you are unable to keep an appointment. What may interact with this medicine? Interactions are not expected. This list may not describe all possible interactions. Give your health care provider a list of all the medicines, herbs, non-prescription drugs, or dietary supplements you use. Also tell them if you smoke, drink alcohol, or use illegal drugs. Some items may interact with your medicine. What should I watch for while using this medicine? Your condition will be monitored  carefully while you are receiving this medicine. Visit your prescriber or health care professional for regular checks on your progress and for the needed blood tests. It is important to keep all appointments. What side effects may I notice from receiving this medicine? Side effects that you should report to your doctor or health care professional as soon as possible: -allergic reactions like skin rash, itching or hives, swelling of the face, lips, or tongue -shortness of breath, chest pain, swelling in a leg -unusual bleeding or bruising Side effects that usually do not require medical attention (report to your doctor or health care professional if they continue or are bothersome): -dizziness -headache -muscle aches -pain in arms and legs -stomach pain -trouble sleeping This list may not describe all possible side effects. Call your doctor for medical advice about side effects. You may report side effects to FDA at 1-800-FDA-1088. Where should I keep my medicine? This drug is given in a hospital or clinic and will not be stored at home. NOTE: This sheet is a summary. It may not cover all possible information. If you have questions about this medicine, talk to your doctor, pharmacist, or health care provider.    2016, Elsevier/Gold Standard. (2008-03-04 15:13:04)  

## 2016-05-18 ENCOUNTER — Ambulatory Visit: Payer: Medicaid Other | Admitting: Hematology

## 2016-05-18 ENCOUNTER — Other Ambulatory Visit: Payer: Medicaid Other

## 2016-05-18 ENCOUNTER — Ambulatory Visit: Payer: Medicaid Other

## 2016-05-21 ENCOUNTER — Other Ambulatory Visit: Payer: Self-pay | Admitting: *Deleted

## 2016-05-21 DIAGNOSIS — D509 Iron deficiency anemia, unspecified: Secondary | ICD-10-CM

## 2016-05-24 ENCOUNTER — Ambulatory Visit (HOSPITAL_BASED_OUTPATIENT_CLINIC_OR_DEPARTMENT_OTHER): Payer: Medicaid Other | Admitting: Hematology

## 2016-05-24 ENCOUNTER — Other Ambulatory Visit: Payer: Self-pay | Admitting: *Deleted

## 2016-05-24 ENCOUNTER — Other Ambulatory Visit (HOSPITAL_BASED_OUTPATIENT_CLINIC_OR_DEPARTMENT_OTHER): Payer: Medicaid Other

## 2016-05-24 ENCOUNTER — Telehealth: Payer: Self-pay | Admitting: Hematology

## 2016-05-24 ENCOUNTER — Ambulatory Visit: Payer: Medicaid Other

## 2016-05-24 VITALS — BP 106/55 | HR 79 | Temp 97.7°F | Resp 18 | Ht 62.0 in | Wt 303.3 lb

## 2016-05-24 DIAGNOSIS — Z86711 Personal history of pulmonary embolism: Secondary | ICD-10-CM

## 2016-05-24 DIAGNOSIS — D693 Immune thrombocytopenic purpura: Secondary | ICD-10-CM

## 2016-05-24 DIAGNOSIS — D509 Iron deficiency anemia, unspecified: Secondary | ICD-10-CM

## 2016-05-24 LAB — COMPREHENSIVE METABOLIC PANEL
ALBUMIN: 3.1 g/dL — AB (ref 3.5–5.0)
ALK PHOS: 95 U/L (ref 40–150)
ALT: 18 U/L (ref 0–55)
AST: 18 U/L (ref 5–34)
Anion Gap: 9 mEq/L (ref 3–11)
BUN: 10.5 mg/dL (ref 7.0–26.0)
CALCIUM: 9 mg/dL (ref 8.4–10.4)
CHLORIDE: 109 meq/L (ref 98–109)
CO2: 22 mEq/L (ref 22–29)
CREATININE: 0.7 mg/dL (ref 0.6–1.1)
EGFR: >90 mL/min/{1.73_m2} (ref >90)
GLUCOSE: 86 mg/dL (ref 70–140)
Potassium: 4.1 mEq/L (ref 3.5–5.1)
Sodium: 140 mEq/L (ref 136–145)
Total Bilirubin: 0.29 mg/dL (ref 0.20–1.20)
Total Protein: 7.6 g/dL (ref 6.4–8.3)

## 2016-05-24 LAB — CBC WITH DIFFERENTIAL/PLATELET
BASO%: 0.4 % (ref 0.0–2.0)
BASOS ABS: 0 10*3/uL (ref 0.0–0.1)
EOS ABS: 0.1 10*3/uL (ref 0.0–0.5)
EOS%: 0.8 % (ref 0.0–7.0)
HCT: 33.8 % — ABNORMAL LOW (ref 34.8–46.6)
HEMOGLOBIN: 10.8 g/dL — AB (ref 11.6–15.9)
LYMPH%: 22.4 % (ref 14.0–49.7)
MCH: 23.4 pg — AB (ref 25.1–34.0)
MCHC: 31.9 g/dL (ref 31.5–36.0)
MCV: 73.4 fL — AB (ref 79.5–101.0)
MONO#: 0.4 10*3/uL (ref 0.1–0.9)
MONO%: 5.7 % (ref 0.0–14.0)
NEUT%: 70.7 % (ref 38.4–76.8)
NEUTROS ABS: 4.9 10*3/uL (ref 1.5–6.5)
PLATELETS: 510 10*3/uL — AB (ref 145–400)
RBC: 4.6 10*6/uL (ref 3.70–5.45)
RDW: 16.2 % — ABNORMAL HIGH (ref 11.2–14.5)
WBC: 7 10*3/uL (ref 3.9–10.3)
lymph#: 1.6 10*3/uL (ref 0.9–3.3)

## 2016-05-24 LAB — IRON AND TIBC
%SAT: 6 % — ABNORMAL LOW (ref 21–57)
IRON: 27 ug/dL — AB (ref 41–142)
TIBC: 453 ug/dL — ABNORMAL HIGH (ref 236–444)
UIBC: 426 ug/dL — ABNORMAL HIGH (ref 120–384)

## 2016-05-24 LAB — FERRITIN: FERRITIN: 30 ng/mL (ref 9–269)

## 2016-05-24 MED ORDER — ROMIPLOSTIM 250 MCG ~~LOC~~ SOLR: 2.0000 ug/kg | Freq: Once | SUBCUTANEOUS | Status: DC

## 2016-05-24 NOTE — Progress Notes (Signed)
HEMATOLOGY/ONCOLOGY CLINIC NOTE  Date of Service: 05/24/2016  PCP: [facility] COMPLAINTS/PURPOSE OF CONSULTATION:  Chronic ITP now with relapse  HISTORY OF PRESENTING ILLNESS:   Kiara Barrera is a wonderful 40 y.o. female who has been referred to [place] by [facility] for evaluation and management of Chronic ITP.  Patient was been managed for her chronic ITP by Dr Yvone Neu at [facility] in [place], [place] and recently moved to [place] and wants to establish care here.  As per review of outside records the patient was diagnosed with ITP since 2012 in [place] and has been treated with prednisone on off since then. She apparently had significant weight gain with prednisone and had chronically low platelets despite the prednisone. She was then started on Promacta 50 mg by mouth daily in June 2016 and had a very good response with platelet counts of more than 500k. Her Promacta was discontinued in July 2016. She had relapse of her ITP in September 2016 with platelet count was down to 49k any significant bleeding or bruising. He has been on variable doses of Promacta since then and reports that she is currently taking 25 mg on Monday Wednesday and Friday.  He has had previous iron deficiency due to heavy periods which was treated with oral iron replacement.  INTERVAL HISTORY  Kiara Barrera is here for followup of her ITP. She notes no issues with bleeding. She has been taking her Promacta at 25 mg by mouth daily.  Received Nplate last week for platelet count of 13k. Her platelet counts have improved to 510k today. Noted to have some microcytic anemia likely due to iron deficiency. No other acute new symptoms. Is in good spirits period.   MEDICAL HISTORY:  #1 chronic ITP #2 Iron deficiency anemia due to heavy periods #3 hypothyroidism #4 degenerative arthritis #5 anxiety  disorder #6 history of pulmonary embolism in 2004 status post IVC filter placement. 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 #7 migraine headaches  SURGICAL HISTORY: Past Surgical History:  Procedure Laterality Date  . C secton  2002  . IVC FILTER PLACEMENT ([place] HX)  2004  . Left axillary lymph node excision biopsy  2008    SOCIAL HISTORY: Social History   Social History  . Marital status: Single    Spouse name: N/A  . Number of children: N/A  . Years of education: N/A   Occupational History  . Not on file.   Social History Main Topics  . Smoking status: Never Smoker  . Smokeless tobacco: Never Used  . Alcohol use Yes     Comment: occasional: not that often  . Drug use: No  . Sexual activity: Not on file   Other Topics Concern  . Not on file   Social History Narrative  . No narrative on file  Recently moved from [place] , [place] to [place]. Nonsmoker minimal social alcohol use no drug use Currently unemployed Has a 34 year old daughter  FAMILY HISTORY: No known history of blood disorders Mother had a history of cervical cancer  maternal grandmother diabetes type 2 Maternal uncle diabetes type 2  ALLERGIES:  has No Known Allergies.  MEDICATIONS:  Current Outpatient Prescriptions  Medication Sig Dispense Refill  . eltrombopag (PROMACTA) 25 MG tablet Take 1 tablet (  25 mg total) by mouth daily. Take on an empty stomach, 1 hour before a meal or 2 hours after. 30 tablet 1  . IRON PO Take 65 mg by mouth.    . levothyroxine (SYNTHROID, LEVOTHROID) 100 MCG tablet Take 1 tablet (100 mcg total) by mouth daily before breakfast. 30 tablet 1  . Multiple Vitamin (MULTIVITAMIN) tablet Take 1 tablet by mouth daily.     No current facility-administered medications for this visit.     REVIEW OF SYSTEMS:    10 Point review of Systems was done is negative except  as noted above.  PHYSICAL EXAMINATION: ECOG PERFORMANCE STATUS: 1 - Symptomatic but completely ambulatory  . Vitals:   05/24/16 0953  BP: (!) 106/55  Pulse: 79  Resp: 18  Temp: 97.7 F (36.5 C)   Filed Weights   05/24/16 0953  Weight: (!) 303 lb 4.8 oz (137.6 kg)   .Body mass index is 55.47 kg/m.  GENERAL:alert, in no acute distress and comfortable SKIN: skin color, texture, turgor are normal, no rashes or significant lesions EYES: normal, conjunctiva are pink and non-injected, sclera clear OROPHARYNX:no exudate, no erythema and lips, buccal mucosa, and tongue normal  NECK: supple, no JVD, thyroid normal size, non-tender, without nodularity LYMPH:  no palpable lymphadenopathy in the cervical, axillary or inguinal LUNGS: clear to auscultation with normal respiratory effort HEART: regular rate & rhythm,  no murmurs and no lower extremity edema ABDOMEN: abdomen soft, non-tender, normoactive bowel sounds  Musculoskeletal: no cyanosis of digits and no clubbing  PSYCH: alert & oriented x 3 with fluent speech NEURO: no focal motor/sensory deficits  LABORATORY DATA:  I have reviewed the data as listed  . CBC Latest Ref Rng & Units 05/24/2016 05/17/2016 05/07/2016  WBC 3.9 - 10.3 10e3/uL 7.0 13.2(H) 24.4(H)  Hemoglobin 11.6 - 15.9 g/dL 10.8(L) 10.9(L) 10.2(L)  Hematocrit 34.8 - 46.6 % 33.8(L) 32.4(L) 30.4(L)  Platelets 145 - 400 10e3/uL 510(H) 13 Large platelets present(L) 285   . CMP Latest Ref Rng & Units 05/24/2016 05/03/2016 04/19/2016  Glucose 70 - 140 mg/dl 86 81 75  BUN 7.0 - 26.0 mg/dL 10.5 13.4 10.6  Creatinine 0.6 - 1.1 mg/dL 0.7 0.7 0.7  Sodium 136 - 145 mEq/L 140 139 140  Potassium 3.5 - 5.1 mEq/L 4.1 4.0 3.9  CO2 22 - 29 mEq/L 22 24 24   Calcium 8.4 - 10.4 mg/dL 9.0 8.8 9.2  Total Protein 6.4 - 8.3 g/dL 7.6 7.4 7.9  Total Bilirubin 0.20 - 1.20 mg/dL 0.29 0.31 0.44  Alkaline Phos 40 - 150 U/L 95 93 103  AST 5 - 34 U/L 18 24 18   ALT 0 - 55 U/L 18 25 21      RADIOGRAPHIC STUDIES: I have personally reviewed the radiological images as listed and agreed with the findings in the report. No results found.  ASSESSMENT & PLAN:   40 year old African-American female with   1) Chronic ITP since 2012 now with acute relapse of her ITP with very labile platelet counts No overt bleeding at this time. Patient has been on and off steroids and has been on promacta since 2016. Was apparently on Promacta 25mg  po MWF. No issues with bleeding at this time. No petechiae B12 wnl Plan -No indication for Nplate today given bump in platelet counts to 510k -Continue Promacta 25 mg by mouth daily with weekly CBC and every 2 weekly CMP. -Will give Nplate 31mcg/kg in addition only if platelet counts less than 30,000. -Alternatively if the platelet counts  drop will adjust the dose of Promacta. -absolutely avoid NSAIDS, ASA and similar products - if still remains very labile with promacta despite appropriate dose adjustment will need to consider other treatments including Rituxan or splenectomy. -[place] abd to check for liver pathology and spleen size-pending  2) Iron deficiency Anemia - likely from heavy menstrual losses. Plan -will increase oral iron replacement to iron polysaccharide 150 mg by mouth twice a day  3) Hypothyroidism - on levothyroxine  4) . Patient Active Problem List   Diagnosis Date Noted  . Chronic ITP (idiopathic thrombocytopenia) (HCC) 05/03/2016  . Thrombocytopenia ([place]) 04/19/2016  . Iron deficiency anemia 04/19/2016  . Hypothyroidism 04/19/2016  . Degenerative arthritis 04/19/2016  . Generalized anxiety disorder 04/19/2016   -continue f/u with PCP  5) Obesity  -discussed lifestyle modifications including need for appropriate diet and exercise.  6) H/o PE in 2004-- still has IVC filter in situ. Thought to be triggered by immobility. -not on anticoagulation at this time.  RTC with Dr Irene Limbo in 2week with rpt labs. Continue weekly CBC  and when necessary Nplate for platelets [id number]-GRM   All of the patients questions were answered with apparent satisfaction. The patient knows to call the clinic with any problems, questions or concerns.  I spent 25 minutes counseling the patient face to face. The total time spent in the appointment was 30 minutes and more than 50% was on counseling and direct patient cares.    Sullivan Lone MD [place] AAHIVMS [facility] Hematology/Oncology Physician [facility]  (Office):       [phone] (Work cell):  [phone] (Fax):           [phone]

## 2016-05-24 NOTE — Patient Instructions (Signed)
Switch Iron replacement to -Iron polysaccharide 150 mg by mouth twice a day for iron deficiency anemia. -We shall continue weekly labs at this time due to labile platelet counts, -Nplate only if platelet counts less than 30,000 -We shall adjust your Promacta dose to maintain platelet counts in the 50-100k range. -No over-the-counter NSAIDs except acetaminophen. -No other blood thinners -Marker for excessive bleeding

## 2016-05-24 NOTE — Progress Notes (Signed)
Hold N-Plate today, platelets at 510, per pharmacy.

## 2016-05-24 NOTE — Telephone Encounter (Signed)
Appointments scheduled per 05/24/16 los. AVS report and appointment schedule given to patient, per 05/24/16 los. °

## 2016-05-24 NOTE — Progress Notes (Signed)
[place] order placed 05/06/16.  Pt in office today for MD visit and stated she had not been contacted to set up apt yet.  This RN contacted central scheduling, scheduler Felicia  advised she could see the order and she would make sure pt would be contacted to set up apt.

## 2016-05-25 ENCOUNTER — Ambulatory Visit: Payer: Medicaid Other

## 2016-05-25 ENCOUNTER — Other Ambulatory Visit: Payer: Medicaid Other

## 2016-05-27 ENCOUNTER — Ambulatory Visit: Payer: Medicaid Other | Admitting: Obstetrics & Gynecology

## 2016-05-31 ENCOUNTER — Other Ambulatory Visit (HOSPITAL_BASED_OUTPATIENT_CLINIC_OR_DEPARTMENT_OTHER): Payer: Medicaid Other

## 2016-05-31 ENCOUNTER — Ambulatory Visit: Payer: Medicaid Other

## 2016-05-31 DIAGNOSIS — D693 Immune thrombocytopenic purpura: Secondary | ICD-10-CM

## 2016-05-31 LAB — CBC & DIFF AND RETIC
BASO%: 0.2 % (ref 0.0–2.0)
BASOS ABS: 0 10*3/uL (ref 0.0–0.1)
EOS ABS: 0 10*3/uL (ref 0.0–0.5)
EOS%: 0.6 % (ref 0.0–7.0)
HEMATOCRIT: 32.5 % — AB (ref 34.8–46.6)
HEMOGLOBIN: 10.5 g/dL — AB (ref 11.6–15.9)
Immature Retic Fract: 8.9 % (ref 1.60–10.00)
LYMPH%: 33.6 % (ref 14.0–49.7)
MCH: 23.4 pg — AB (ref 25.1–34.0)
MCHC: 32.3 g/dL (ref 31.5–36.0)
MCV: 72.4 fL — AB (ref 79.5–101.0)
MONO#: 0.5 10*3/uL (ref 0.1–0.9)
MONO%: 10.7 % (ref 0.0–14.0)
NEUT%: 54.9 % (ref 38.4–76.8)
NEUTROS ABS: 2.7 10*3/uL (ref 1.5–6.5)
Platelets: 572 10*3/uL — ABNORMAL HIGH (ref 145–400)
RBC: 4.49 10*6/uL (ref 3.70–5.45)
RDW: 15.2 % — ABNORMAL HIGH (ref 11.2–14.5)
Retic %: 1.62 % (ref 0.70–2.10)
Retic Ct Abs: 72.74 10*3/uL (ref 33.70–90.70)
WBC: 4.9 10*3/uL (ref 3.9–10.3)
lymph#: 1.6 10*3/uL (ref 0.9–3.3)

## 2016-05-31 NOTE — Patient Instructions (Signed)
Romiplostim injection What is this medicine? ROMIPLOSTIM (roe mi PLOE stim) helps your body make more platelets. This medicine is used to treat low platelets caused by chronic idiopathic thrombocytopenic purpura (ITP). This medicine may be used for other purposes; ask your health care provider or pharmacist if you have questions. What should I tell my health care provider before I take this medicine? They need to know if you have any of these conditions: -cancer or myelodysplastic syndrome -low blood counts, like low white cell, platelet, or red cell counts -take medicines that treat or prevent blood clots -an unusual or allergic reaction to romiplostim, mannitol, other medicines, foods, dyes, or preservatives -pregnant or trying to get pregnant -breast-feeding How should I use this medicine? This medicine is for injection under the skin. It is given by a health care professional in a hospital or clinic setting. A special MedGuide will be given to you before your injection. Read this information carefully each time. Talk to your pediatrician regarding the use of this medicine in children. Special care may be needed. Overdosage: If you think you have taken too much of this medicine contact a poison control center or emergency room at once. NOTE: This medicine is only for you. Do not share this medicine with others. What if I miss a dose? It is important not to miss your dose. Call your doctor or health care professional if you are unable to keep an appointment. What may interact with this medicine? Interactions are not expected. This list may not describe all possible interactions. Give your health care provider a list of all the medicines, herbs, non-prescription drugs, or dietary supplements you use. Also tell them if you smoke, drink alcohol, or use illegal drugs. Some items may interact with your medicine. What should I watch for while using this medicine? Your condition will be monitored  carefully while you are receiving this medicine. Visit your prescriber or health care professional for regular checks on your progress and for the needed blood tests. It is important to keep all appointments. What side effects may I notice from receiving this medicine? Side effects that you should report to your doctor or health care professional as soon as possible: -allergic reactions like skin rash, itching or hives, swelling of the face, lips, or tongue -shortness of breath, chest pain, swelling in a leg -unusual bleeding or bruising Side effects that usually do not require medical attention (report to your doctor or health care professional if they continue or are bothersome): -dizziness -headache -muscle aches -pain in arms and legs -stomach pain -trouble sleeping This list may not describe all possible side effects. Call your doctor for medical advice about side effects. You may report side effects to FDA at 1-800-FDA-1088. Where should I keep my medicine? This drug is given in a hospital or clinic and will not be stored at home. NOTE: This sheet is a summary. It may not cover all possible information. If you have questions about this medicine, talk to your doctor, pharmacist, or health care provider.    2016, Elsevier/Gold Standard. (2008-03-04 15:13:04)  

## 2016-05-31 NOTE — Progress Notes (Signed)
Pt platelets noted at 572, no injection of n-plate needed at this time. Pt given copy of labs and current schedule. Instructed to keep upcoming appointments and call office if issues occur. Pt verbalized understanding of instructions.

## 2016-06-01 ENCOUNTER — Ambulatory Visit: Payer: Medicaid Other

## 2016-06-01 ENCOUNTER — Other Ambulatory Visit: Payer: Medicaid Other

## 2016-06-02 ENCOUNTER — Ambulatory Visit (HOSPITAL_COMMUNITY)
Admission: RE | Admit: 2016-06-02 | Discharge: 2016-06-02 | Disposition: A | Discharge status: Home / Self Care | Payer: Medicaid Other | Source: Ambulatory Visit | Attending: Hematology | Admitting: Hematology

## 2016-06-02 DIAGNOSIS — Z452 Encounter for adjustment and management of vascular access device: Secondary | ICD-10-CM | POA: Diagnosis not present

## 2016-06-02 DIAGNOSIS — R161 Splenomegaly, not elsewhere classified: Secondary | ICD-10-CM | POA: Insufficient documentation

## 2016-06-02 DIAGNOSIS — D693 Immune thrombocytopenic purpura: Secondary | ICD-10-CM | POA: Insufficient documentation

## 2016-06-07 ENCOUNTER — Ambulatory Visit (HOSPITAL_BASED_OUTPATIENT_CLINIC_OR_DEPARTMENT_OTHER): Payer: Medicaid Other | Admitting: Hematology

## 2016-06-07 ENCOUNTER — Other Ambulatory Visit (HOSPITAL_BASED_OUTPATIENT_CLINIC_OR_DEPARTMENT_OTHER): Payer: Medicaid Other

## 2016-06-07 ENCOUNTER — Other Ambulatory Visit: Payer: Medicaid Other

## 2016-06-07 ENCOUNTER — Encounter: Payer: Self-pay | Admitting: Hematology

## 2016-06-07 ENCOUNTER — Ambulatory Visit: Payer: Medicaid Other

## 2016-06-07 ENCOUNTER — Telehealth: Payer: Self-pay | Admitting: Hematology

## 2016-06-07 VITALS — BP 155/87 | HR 71 | Temp 97.9°F | Resp 18 | Ht 62.0 in | Wt 301.5 lb

## 2016-06-07 DIAGNOSIS — D693 Immune thrombocytopenic purpura: Secondary | ICD-10-CM

## 2016-06-07 LAB — COMPREHENSIVE METABOLIC PANEL
ALT: 16 U/L (ref 0–55)
ANION GAP: 10 meq/L (ref 3–11)
AST: 20 U/L (ref 5–34)
Albumin: 3.5 g/dL (ref 3.5–5.0)
Alkaline Phosphatase: 109 U/L (ref 40–150)
BILIRUBIN TOTAL: 0.36 mg/dL (ref 0.20–1.20)
BUN: 11.9 mg/dL (ref 7.0–26.0)
CHLORIDE: 107 meq/L (ref 98–109)
CO2: 23 meq/L (ref 22–29)
Calcium: 9.4 mg/dL (ref 8.4–10.4)
Creatinine: 0.8 mg/dL (ref 0.6–1.1)
Glucose: 91 mg/dl (ref 70–140)
Potassium: 4.4 mEq/L (ref 3.5–5.1)
Sodium: 140 mEq/L (ref 136–145)
TOTAL PROTEIN: 8 g/dL (ref 6.4–8.3)

## 2016-06-07 LAB — CBC & DIFF AND RETIC
BASO%: 0.2 % (ref 0.0–2.0)
Basophils Absolute: 0 10*3/uL (ref 0.0–0.1)
EOS%: 0.7 % (ref 0.0–7.0)
Eosinophils Absolute: 0 10*3/uL (ref 0.0–0.5)
HCT: 35.2 % (ref 34.8–46.6)
HGB: 11.6 g/dL (ref 11.6–15.9)
Immature Retic Fract: 12.5 % — ABNORMAL HIGH (ref 1.60–10.00)
LYMPH%: 26.9 % (ref 14.0–49.7)
MCH: 23.3 pg — AB (ref 25.1–34.0)
MCHC: 33 g/dL (ref 31.5–36.0)
MCV: 70.8 fL — AB (ref 79.5–101.0)
MONO#: 0.6 10*3/uL (ref 0.1–0.9)
MONO%: 10.6 % (ref 0.0–14.0)
NEUT%: 61.6 % (ref 38.4–76.8)
NEUTROS ABS: 3.7 10*3/uL (ref 1.5–6.5)
PLATELETS: 229 10*3/uL (ref 145–400)
RBC: 4.97 10*6/uL (ref 3.70–5.45)
RDW: 15 % — ABNORMAL HIGH (ref 11.2–14.5)
Retic %: 1.28 % (ref 0.70–2.10)
Retic Ct Abs: 63.62 10*3/uL (ref 33.70–90.70)
WBC: 6.1 10*3/uL (ref 3.9–10.3)
lymph#: 1.6 10*3/uL (ref 0.9–3.3)
nRBC: 0 % (ref 0–0)

## 2016-06-07 MED ORDER — POLYSACCHARIDE IRON COMPLEX 150 MG PO CAPS: 150.0000 mg | ORAL_CAPSULE | Freq: Two times a day (BID) | ORAL | Status: DC

## 2016-06-07 NOTE — Progress Notes (Signed)
HEMATOLOGY/ONCOLOGY CLINIC NOTE  Date of Service: 06/07/2016  PCP: [facility] COMPLAINTS/PURPOSE OF CONSULTATION:  Chronic ITP now with relapse  HISTORY OF PRESENTING ILLNESS:   Kiara Barrera is a wonderful 40 y.o. female who has been referred to [place] by [facility] for evaluation and management of Chronic ITP.  Patient was been managed for her chronic ITP by Dr Yvone Neu at [facility] in [place], [place] and recently moved to [place] and wants to establish care here.  As per review of outside records the patient was diagnosed with ITP since 2012 in [place] and has been treated with prednisone on off since then. She apparently had significant weight gain with prednisone and had chronically low platelets despite the prednisone. She was then started on Promacta 50 mg by mouth daily in June 2016 and had a very good response with platelet counts of more than 500k. Her Promacta was discontinued in July 2016. She had relapse of her ITP in September 2016 with platelet count was down to 49k any significant bleeding or bruising. He has been on variable doses of Promacta since then and reports that she is currently taking 25 mg on Monday Wednesday and Friday.  He has had previous iron deficiency due to heavy periods which was treated with oral iron replacement.  INTERVAL HISTORY  [name] is here for followup of her ITP. She notes no issues with bleeding. She has been taking her Promacta at 25 mg by mouth daily. Notes that she is doing well. No issues with taking her Promacta. Platelet counts are within normal limits at 229k. Hemoglobin has improved from 10.5 to 11.6 with oral iron supplementation. No other acute new concerns.   MEDICAL HISTORY:  #1 chronic ITP #2 Iron deficiency anemia due to heavy periods #3 hypothyroidism #4 degenerative arthritis #5 anxiety  disorder #6 history of pulmonary embolism in 2004 status post IVC filter placement. 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 #7 migraine headaches  SURGICAL HISTORY: Past Surgical History:  Procedure Laterality Date  . C secton  2002  . IVC FILTER PLACEMENT ([facility] HX)  2004  . Left axillary lymph node excision biopsy  2008    SOCIAL HISTORY: Social History   Social History  . Marital status: Single    Spouse name: N/A  . Number of children: N/A  . Years of education: N/A   Occupational History  . Not on file.   Social History Main Topics  . Smoking status: Never Smoker  . Smokeless tobacco: Never Used  . Alcohol use Yes     Comment: occasional: not that often  . Drug use: No  . Sexual activity: Not on file   Other Topics Concern  . Not on file   Social History Narrative  . No narrative on file  Recently moved from [place] , [place] to [place]. Nonsmoker minimal social alcohol use no drug use Currently unemployed Has a 65 year old daughter  FAMILY HISTORY: No known history of blood disorders Mother had a history of cervical cancer  maternal grandmother diabetes type 2 Maternal uncle diabetes type 2  ALLERGIES:  has No Known Allergies.  MEDICATIONS:  Current Outpatient Prescriptions  Medication Sig Dispense Refill  . eltrombopag (PROMACTA) 25 MG tablet Take 1 tablet (25 mg total)  by mouth daily. Take on an empty stomach, 1 hour before a meal or 2 hours after. 30 tablet 1  . levothyroxine (SYNTHROID, LEVOTHROID) 100 MCG tablet Take 1 tablet (100 mcg total) by mouth daily before breakfast. 30 tablet 1  . Multiple Vitamin (MULTIVITAMIN) tablet Take 1 tablet by mouth daily.    . iron polysaccharides (NIFEREX) 150 MG capsule Take 1 capsule (150 mg total) by mouth 2 (two) times daily.     No current facility-administered medications for this visit.      REVIEW OF SYSTEMS:    10 Point review of Systems was done is negative except as noted above.  PHYSICAL EXAMINATION: ECOG PERFORMANCE STATUS: 1 - Symptomatic but completely ambulatory  . Vitals:   06/07/16 0844  BP: (!) 155/87  Pulse: 71  Resp: 18  Temp: 97.9 F (36.6 C)   Filed Weights   06/07/16 0844  Weight: (!) 301 lb 8 oz (136.8 kg)   .Body mass index is 55.15 kg/m.  GENERAL:alert, in no acute distress and comfortable SKIN: skin color, texture, turgor are normal, no rashes or significant lesions EYES: normal, conjunctiva are pink and non-injected, sclera clear OROPHARYNX:no exudate, no erythema and lips, buccal mucosa, and tongue normal  NECK: supple, no JVD, thyroid normal size, non-tender, without nodularity LYMPH:  no palpable lymphadenopathy in the cervical, axillary or inguinal LUNGS: clear to auscultation with normal respiratory effort HEART: regular rate & rhythm,  no murmurs and no lower extremity edema ABDOMEN: abdomen soft, non-tender, normoactive bowel sounds  Musculoskeletal: no cyanosis of digits and no clubbing  PSYCH: alert & oriented x 3 with fluent speech NEURO: no focal motor/sensory deficits  LABORATORY DATA:  I have reviewed the data as listed  . CBC Latest Ref Rng & Units 06/07/2016 05/31/2016 05/24/2016  WBC 3.9 - 10.3 10e3/uL 6.1 4.9 7.0  Hemoglobin 11.6 - 15.9 g/dL 11.6 10.5(L) 10.8(L)  Hematocrit 34.8 - 46.6 % 35.2 32.5(L) 33.8(L)  Platelets 145 - 400 10e3/uL 229 572(H) 510(H)   . CMP Latest Ref Rng & Units 06/07/2016 05/24/2016 05/03/2016  Glucose 70 - 140 mg/dl 91 86 81  BUN 7.0 - 26.0 mg/dL 11.9 10.5 13.4  Creatinine 0.6 - 1.1 mg/dL 0.8 0.7 0.7  Sodium 136 - 145 mEq/L 140 140 139  Potassium 3.5 - 5.1 mEq/L 4.4 4.1 4.0  CO2 22 - 29 mEq/L 23 22 24   Calcium 8.4 - 10.4 mg/dL 9.4 9.0 8.8  Total Protein 6.4 - 8.3 g/dL 8.0 7.6 7.4  Total Bilirubin 0.20 - 1.20 mg/dL 0.36 0.29 0.31  Alkaline Phos 40 - 150 U/L 109 95 93  AST 5 - 34  U/L 20 18 24   ALT 0 - 55 U/L 16 18 25     RADIOGRAPHIC STUDIES: I have personally reviewed the radiological images as listed and agreed with the findings in the report. US Abdomen Complete  Result Date: 06/02/2016 CLINICAL DATA:  Severe thrombocytopenia. Patient has undergone IVC filter placement. EXAM: ABDOMEN ULTRASOUND COMPLETE COMPARISON:  None in PACs FINDINGS: Gallbladder: No gallstones or wall thickening visualized. No sonographic Murphy sign noted by sonographer. Common bile duct: Diameter: 3.1 mm Liver: The hepatic echotexture is normal. There is no focal mass or ductal dilation. IVC: An IVC filter is visible. Pancreas: Visualized portion unremarkable. Spleen: There is mild splenomegaly. The spleen measures 12.9 x 13.9 x 6.7 cm. The calculated volume is 628 cc Right Kidney: Length: 11.0. Echogenicity within normal limits. No mass or hydronephrosis visualized. Left Kidney: Length: 14.1. Echogenicity within  normal limits. No mass or hydronephrosis visualized. Abdominal aorta: No aneurysm visualized. Other findings: There is no ascites. IMPRESSION: Splenomegaly.  Normal appearance of the liver. No gallstones or sonographic evidence of acute cholecystitis. Normal appearance of the kidneys and abdominal aorta. An IVC filter is present. Electronically Signed   By: David  Martinique M.D.   On: 06/02/2016 09:31    ASSESSMENT & PLAN:   40 year old African-American female with   1) Chronic ITP since 2012 now with acute relapse of her ITP with very labile platelet counts No overt bleeding at this time. Patient has been on and off steroids and has been on promacta since 2016. Was apparently on Promacta 25mg  po MWF. No issues with bleeding at this time. No petechiae B12 wnl Ultrasound abdomen did show some mild splenomegaly which could be an additional factor in her thrombocytopenia. The spleen measures 12.9 x 13.9 x 6.7 cm. The calculated volume is 628 cc. Unclear etiology of splenomegaly. Liver  appeared normal.  Plan -No indication for Nplate today given bump in platelet counts remain stable at 229k -Continue Promacta 25 mg by mouth daily  -Repeat CBC in 2 weeks -Return to clinic with Dr. Irene Limbo in 4 weeks with repeat CBC again. -Will give Nplate 85mcg/kg in addition only if platelet counts less than 30,000. -absolutely avoid NSAIDS, ASA and similar products - if still remains very labile with promacta despite appropriate dose adjustment will need to consider other treatments including Rituxan or splenectomy. -Patient counseled on importance of weight loss and remaining physically active.  2) Iron deficiency Anemia - likely from heavy menstrual losses. Hemoglobin has improved from 10.5 to11.6 Plan -Continue oral iron replacement to iron polysaccharide 150 mg by mouth twice a day  3) Hypothyroidism - on levothyroxine  4) . Patient Active Problem List   Diagnosis Date Noted  . Chronic ITP (idiopathic thrombocytopenia) (HCC) 05/03/2016  . Thrombocytopenia ([place]) 04/19/2016  . Iron deficiency anemia 04/19/2016  . Hypothyroidism 04/19/2016  . Degenerative arthritis 04/19/2016  . Generalized anxiety disorder 04/19/2016   -continue f/u with PCP  5) Obesity  -discussed lifestyle modifications including need for appropriate diet and exercise.  6) H/o PE in 2004-- still has IVC filter in situ. Thought to be triggered by immobility. -not on anticoagulation at this time.  RTC with Dr Irene Limbo in 4 week with rpt labs. Continue q2weekly CBC and when necessary Nplate for platelets [id number]-GRM   All of the patients questions were answered with apparent satisfaction. The patient knows to call the clinic with any problems, questions or concerns.  I spent 25 minutes counseling the patient face to face. The total time spent in the appointment was 30 minutes and more than 50% was on counseling and direct patient cares.    Sullivan Lone MD [place] AAHIVMS [facility] Hematology/Oncology Physician [facility]  (Office):       [phone] (Work cell):  [phone] (Fax):           [phone]

## 2016-06-07 NOTE — Telephone Encounter (Signed)
Appointments scheduled per 06/07/16 los. A copy of the AVS report & appointment schedule was given to patient,per 06/07/16 los. °

## 2016-06-08 ENCOUNTER — Ambulatory Visit: Payer: Medicaid Other

## 2016-06-08 ENCOUNTER — Other Ambulatory Visit: Payer: Medicaid Other

## 2016-06-13 ENCOUNTER — Other Ambulatory Visit: Payer: Self-pay | Admitting: Hematology

## 2016-06-14 ENCOUNTER — Other Ambulatory Visit: Payer: Medicaid Other

## 2016-06-14 ENCOUNTER — Ambulatory Visit: Payer: Medicaid Other

## 2016-06-14 ENCOUNTER — Other Ambulatory Visit: Payer: Self-pay | Admitting: *Deleted

## 2016-06-14 MED ORDER — LEVOTHYROXINE SODIUM 100 MCG PO TABS: 100.0000 ug | ORAL_TABLET | Freq: Every day | ORAL | 1 refills | Status: DC

## 2016-06-15 ENCOUNTER — Ambulatory Visit: Payer: Medicaid Other

## 2016-06-15 ENCOUNTER — Other Ambulatory Visit: Payer: Medicaid Other

## 2016-06-21 ENCOUNTER — Other Ambulatory Visit (HOSPITAL_BASED_OUTPATIENT_CLINIC_OR_DEPARTMENT_OTHER): Payer: Medicaid Other

## 2016-06-21 ENCOUNTER — Ambulatory Visit: Payer: Medicaid Other

## 2016-06-21 DIAGNOSIS — D693 Immune thrombocytopenic purpura: Secondary | ICD-10-CM | POA: Diagnosis present

## 2016-06-21 LAB — CBC & DIFF AND RETIC
BASO%: 0 % (ref 0.0–2.0)
Basophils Absolute: 0 10*3/uL (ref 0.0–0.1)
EOS%: 1.3 % (ref 0.0–7.0)
Eosinophils Absolute: 0.1 10*3/uL (ref 0.0–0.5)
HEMATOCRIT: 34.2 % — AB (ref 34.8–46.6)
HGB: 11.3 g/dL — ABNORMAL LOW (ref 11.6–15.9)
Immature Retic Fract: 13.2 % — ABNORMAL HIGH (ref 1.60–10.00)
LYMPH%: 29.8 % (ref 14.0–49.7)
MCH: 23.4 pg — AB (ref 25.1–34.0)
MCHC: 33 g/dL (ref 31.5–36.0)
MCV: 71 fL — AB (ref 79.5–101.0)
MONO#: 0.4 10*3/uL (ref 0.1–0.9)
MONO%: 8.4 % (ref 0.0–14.0)
NEUT%: 60.5 % (ref 38.4–76.8)
NEUTROS ABS: 3.2 10*3/uL (ref 1.5–6.5)
PLATELETS: 61 10*3/uL — AB (ref 145–400)
RBC: 4.82 10*6/uL (ref 3.70–5.45)
RDW: 15.8 % — ABNORMAL HIGH (ref 11.2–14.5)
Retic %: 1.42 % (ref 0.70–2.10)
Retic Ct Abs: 68.44 10*3/uL (ref 33.70–90.70)
WBC: 5.2 10*3/uL (ref 3.9–10.3)
lymph#: 1.6 10*3/uL (ref 0.9–3.3)
nRBC: 0 % (ref 0–0)

## 2016-06-21 NOTE — Progress Notes (Signed)
Platelets noted at 61 today. No injection needed per Dr. Pollie Meyer noted that stated "Give N-plate if platelets are less than 30.' Current labs and schedule given to patient. Instructed to follow current schedule and call office at once if bleeding noted or sudden bruising noted. Pt verbalized understanding of instructions.

## 2016-07-05 ENCOUNTER — Other Ambulatory Visit (HOSPITAL_BASED_OUTPATIENT_CLINIC_OR_DEPARTMENT_OTHER): Payer: Medicaid Other

## 2016-07-05 ENCOUNTER — Ambulatory Visit (HOSPITAL_BASED_OUTPATIENT_CLINIC_OR_DEPARTMENT_OTHER): Payer: Medicaid Other | Admitting: Hematology

## 2016-07-05 ENCOUNTER — Telehealth: Payer: Self-pay | Admitting: Hematology

## 2016-07-05 VITALS — BP 137/60 | HR 70 | Temp 97.6°F | Resp 20 | Ht 62.0 in | Wt 301.2 lb

## 2016-07-05 DIAGNOSIS — D509 Iron deficiency anemia, unspecified: Secondary | ICD-10-CM | POA: Diagnosis not present

## 2016-07-05 DIAGNOSIS — D693 Immune thrombocytopenic purpura: Secondary | ICD-10-CM | POA: Diagnosis present

## 2016-07-05 DIAGNOSIS — E039 Hypothyroidism, unspecified: Secondary | ICD-10-CM

## 2016-07-05 DIAGNOSIS — Z86711 Personal history of pulmonary embolism: Secondary | ICD-10-CM | POA: Diagnosis not present

## 2016-07-05 LAB — CBC & DIFF AND RETIC
BASO%: 0 % (ref 0.0–2.0)
BASOS ABS: 0 10*3/uL (ref 0.0–0.1)
EOS ABS: 0 10*3/uL (ref 0.0–0.5)
EOS%: 0.9 % (ref 0.0–7.0)
HEMATOCRIT: 34.6 % — AB (ref 34.8–46.6)
HEMOGLOBIN: 11.4 g/dL — AB (ref 11.6–15.9)
IMMATURE RETIC FRACT: 8.5 % (ref 1.60–10.00)
LYMPH#: 1.5 10*3/uL (ref 0.9–3.3)
LYMPH%: 31.7 % (ref 14.0–49.7)
MCH: 23.5 pg — ABNORMAL LOW (ref 25.1–34.0)
MCHC: 32.9 g/dL (ref 31.5–36.0)
MCV: 71.2 fL — AB (ref 79.5–101.0)
MONO#: 0.5 10*3/uL (ref 0.1–0.9)
MONO%: 11.3 % (ref 0.0–14.0)
NEUT#: 2.6 10*3/uL (ref 1.5–6.5)
NEUT%: 56.1 % (ref 38.4–76.8)
PLATELETS: 83 10*3/uL — AB (ref 145–400)
RBC: 4.86 10*6/uL (ref 3.70–5.45)
RDW: 16.2 % — ABNORMAL HIGH (ref 11.2–14.5)
RETIC CT ABS: 56.86 10*3/uL (ref 33.70–90.70)
Retic %: 1.17 % (ref 0.70–2.10)
WBC: 4.7 10*3/uL (ref 3.9–10.3)

## 2016-07-05 LAB — COMPREHENSIVE METABOLIC PANEL
ALBUMIN: 3.5 g/dL (ref 3.5–5.0)
ALK PHOS: 100 U/L (ref 40–150)
ALT: 23 U/L (ref 0–55)
ANION GAP: 9 meq/L (ref 3–11)
AST: 23 U/L (ref 5–34)
BILIRUBIN TOTAL: 0.39 mg/dL (ref 0.20–1.20)
BUN: 5.1 mg/dL — ABNORMAL LOW (ref 7.0–26.0)
CALCIUM: 9.3 mg/dL (ref 8.4–10.4)
CO2: 27 meq/L (ref 22–29)
CREATININE: 0.7 mg/dL (ref 0.6–1.1)
Chloride: 106 mEq/L (ref 98–109)
Glucose: 93 mg/dl (ref 70–140)
Potassium: 4.1 mEq/L (ref 3.5–5.1)
Sodium: 142 mEq/L (ref 136–145)
TOTAL PROTEIN: 7.7 g/dL (ref 6.4–8.3)

## 2016-07-05 NOTE — Telephone Encounter (Signed)
Appointments scheduled per 12/18 LOS. Patient given AVS report and calendars with future scheduled appointments.  °

## 2016-07-05 NOTE — Progress Notes (Signed)
HEMATOLOGY/ONCOLOGY CLINIC NOTE  Date of Service: 07/05/2016  PCP: [facility] COMPLAINTS/PURPOSE OF CONSULTATION:  Chronic ITP now with relapse  HISTORY OF PRESENTING ILLNESS:   Kiara Barrera is a wonderful 40 y.o. female who has been referred to [place] by [facility] for evaluation and management of Chronic ITP.  Patient was been managed for her chronic ITP by Dr Yvone Neu at [facility] center in [place], [place] and recently moved to [place] and wants to establish care here.  As per review of outside records the patient was diagnosed with ITP since 2012 in [place] and has been treated with prednisone on off since then. She apparently had significant weight gain with prednisone and had chronically low platelets despite the prednisone. She was then started on Promacta 50 mg by mouth daily in June 2016 and had a very good response with platelet counts of more than 500k. Her Promacta was discontinued in July 2016. She had relapse of her ITP in September 2016 with platelet count was down to 49k any significant bleeding or bruising. He has been on variable doses of Promacta since then and reports that she is currently taking 25 mg on Monday Wednesday and Friday.  He has had previous iron deficiency due to heavy periods which was treated with oral iron replacement.  INTERVAL HISTORY  Kiara Barrera is here for followup of her ITP. She notes no issues with bleeding. She has been taking her Promacta at 25 mg by mouth daily. Notes that she is doing well. No issues with taking her Promacta. Platelet counts are 83k today up from 61k 2 weeks ago. No other acute new symptoms. She is taking her iron for iron deficiency anemia without any concerns. She reports that she and her partner contemplating having another child. We discussed at length the several different considerations with regards to  ITP treatment during pregnancy, risk of neonatal thrombocytopenia, and limitations of medication use during pregnancy due to lack of data on potential fetal outcomes. I offered her a referral to [facility] for additional consultation regarding their input. She notes that she is not sure that she going to try for a child at this time, but will let us know.  MEDICAL HISTORY:  #1 chronic ITP #2 Iron deficiency anemia due to heavy periods #3 hypothyroidism #4 degenerative arthritis #5 anxiety disorder #6 history of pulmonary embolism in 2004 status post IVC filter placement. 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 #7 migraine headaches  SURGICAL HISTORY: Past Surgical History:  Procedure Laterality Date  . C secton  2002  . IVC FILTER PLACEMENT ([place] HX)  2004  . Left axillary lymph node excision biopsy  2008    SOCIAL HISTORY: Social History   Social History  . Marital status: Single    Spouse name: N/A  . Number of children: N/A  . Years of education: N/A   Occupational History  . Not on file.   Social History Main Topics  . Smoking status: Never Smoker  . Smokeless tobacco: Never Used  . Alcohol use Yes     Comment: occasional: not that often  . Drug use: No  . Sexual activity: Not on file   Other Topics Concern  . Not on file   Social History Narrative  .  No narrative on file  Recently moved from [place] , [place] to [place]. Nonsmoker minimal social alcohol use no drug use Currently unemployed Has a 9 year old daughter  FAMILY HISTORY: No known history of blood disorders Mother had a history of cervical cancer  maternal grandmother diabetes type 2 Maternal uncle diabetes type 2  ALLERGIES:  has No Known Allergies.  MEDICATIONS:  Current Outpatient Prescriptions  Medication Sig Dispense Refill  . Calcium Carbonate-Vitamin D (CALCIUM  600/VITAMIN D PO) Take by mouth.    . eltrombopag (PROMACTA) 25 MG tablet Take 1 tablet (25 mg total) by mouth daily. Take on an empty stomach, 1 hour before a meal or 2 hours after. 30 tablet 1  . iron polysaccharides (NIFEREX) 150 MG capsule Take 1 capsule (150 mg total) by mouth 2 (two) times daily. (Patient taking differently: Take 150 mg by mouth daily. )    . levothyroxine (SYNTHROID, LEVOTHROID) 100 MCG tablet Take 1 tablet (100 mcg total) by mouth daily before breakfast. 30 tablet 1  . levothyroxine (SYNTHROID, LEVOTHROID) 100 MCG tablet Take 1 tablet (100 mcg total) by mouth daily before breakfast. 30 tablet 1  . Multiple Vitamin (MULTIVITAMIN) tablet Take 1 tablet by mouth daily.     No current facility-administered medications for this visit.     REVIEW OF SYSTEMS:    10 Point review of Systems was done is negative except as noted above.  PHYSICAL EXAMINATION: ECOG PERFORMANCE STATUS: 1 - Symptomatic but completely ambulatory  . Vitals:   07/05/16 1026  BP: 137/60  Pulse: 70  Resp: 20  Temp: 97.6 F (36.4 C)   Filed Weights   07/05/16 1026  Weight: (!) 301 lb 3.2 oz (136.6 kg)   .Body mass index is 55.09 kg/m.  GENERAL:alert, in no acute distress and comfortable SKIN: skin color, texture, turgor are normal, no rashes or significant lesions EYES: normal, conjunctiva are pink and non-injected, sclera clear OROPHARYNX:no exudate, no erythema and lips, buccal mucosa, and tongue normal  NECK: supple, no JVD, thyroid normal size, non-tender, without nodularity LYMPH:  no palpable lymphadenopathy in the cervical, axillary or inguinal LUNGS: clear to auscultation with normal respiratory effort HEART: regular rate & rhythm,  no murmurs and no lower extremity edema ABDOMEN: abdomen soft, non-tender, normoactive bowel sounds  Musculoskeletal: no cyanosis of digits and no clubbing  PSYCH: alert & oriented x 3 with fluent speech NEURO: no focal motor/sensory  deficits  LABORATORY DATA:  I have reviewed the data as listed  . CBC Latest Ref Rng & Units 07/05/2016 06/21/2016 06/07/2016  WBC 3.9 - 10.3 10e3/uL 4.7 5.2 6.1  Hemoglobin 11.6 - 15.9 g/dL 11.4(L) 11.3(L) 11.6  Hematocrit 34.8 - 46.6 % 34.6(L) 34.2(L) 35.2  Platelets 145 - 400 10e3/uL 83(L) 61(L) 229   . CMP Latest Ref Rng & Units 07/05/2016 06/07/2016 05/24/2016  Glucose 70 - 140 mg/dl 93 91 86  BUN 7.0 - 26.0 mg/dL 5.1(L) 11.9 10.5  Creatinine 0.6 - 1.1 mg/dL 0.7 0.8 0.7  Sodium 136 - 145 mEq/L 142 140 140  Potassium 3.5 - 5.1 mEq/L 4.1 4.4 4.1  CO2 22 - 29 mEq/L 27 23 22   Calcium 8.4 - 10.4 mg/dL 9.3 9.4 9.0  Total Protein 6.4 - 8.3 g/dL 7.7 8.0 7.6  Total Bilirubin 0.20 - 1.20 mg/dL 0.39 0.36 0.29  Alkaline Phos 40 - 150 U/L 100 109 95  AST 5 - 34 U/L 23 20 18   ALT 0 - 55 U/L 23 16 18  RADIOGRAPHIC STUDIES: I have personally reviewed the radiological images as listed and agreed with the findings in the report. No results found.  ASSESSMENT & PLAN:   40 year old African-American female with   1) Chronic ITP since 2012 now with acute relapse of her ITP with very labile platelet counts No overt bleeding at this time. Patient has been on and off steroids and has been on promacta since 2016. Was apparently on Promacta 25mg  po MWF. No issues with bleeding at this time. No petechiae B12 wnl Ultrasound abdomen did show some mild splenomegaly which could be an additional factor in her thrombocytopenia. The spleen measures 12.9 x 13.9 x 6.7 cm. The calculated volume is 628 cc. Unclear etiology of splenomegaly. Liver appeared normal.  Platelets stable and adequate at 83k  Plan --Continue Promacta 25 mg by mouth daily . No indication for dose increase at this time. --Patient reports she and her partner contemplating having another child. Discussed in great length several different considerations with regards to ITP treatment during pregnancy, risk of neonatal  thrombocytopenia, and limitations of medication use during pregnancy due to lack of data on potential fetal outcomes. I offered her a referral to [facility] for consultation regarding their input. She notes that she is not sure that she is going to try for a child but will let us know. Her previous history of PE would be an additional factor consideration as well. -Repeat CBC in 2 weeks -Return to clinic with Dr. Irene Limbo in 4 weeks with repeat CBC again. -absolutely avoid NSAIDS, ASA and similar products - if still remains very labile with promacta despite appropriate dose adjustment will need to consider other treatments including Rituxan or splenectomy. -Patient counseled on importance of weight loss and remaining physically active.  2) Iron deficiency Anemia - likely from heavy menstrual losses. Hemoglobin has improved from 10.5 to11.6 Plan -Continue oral iron replacement to iron polysaccharide 150 mg by mouth twice a day  3) Hypothyroidism - on levothyroxine  4) . Patient Active Problem List   Diagnosis Date Noted  . Chronic ITP (idiopathic thrombocytopenia) (HCC) 05/03/2016  . Thrombocytopenia ([place]) 04/19/2016  . Iron deficiency anemia 04/19/2016  . Hypothyroidism 04/19/2016  . Degenerative arthritis 04/19/2016  . Generalized anxiety disorder 04/19/2016   -continue f/u with PCP  5) Obesity  -discussed lifestyle modifications including need for appropriate diet and exercise.  6) H/o PE in 2004-- still has IVC filter in situ. Thought to be triggered by immobility. -not on anticoagulation at this time.  RTC with Dr Irene Limbo in 4 week with rpt labs. Continue q2weekly CBC and when necessary Nplate for platelets [id number]   All of the patients questions were answered with apparent satisfaction. The patient knows to call the clinic with any problems, questions or concerns.  I spent 25 minutes counseling the patient face to face. The total time spent in the appointment was 25 minutes and  more than 50% was on counseling and direct patient cares.    Sullivan Lone MD [place] AAHIVMS [facility] Hematology/Oncology Physician [facility]  (Office):       [phone] (Work cell):  [phone] (Fax):           [phone]

## 2016-07-06 ENCOUNTER — Other Ambulatory Visit: Payer: Self-pay | Admitting: Hematology

## 2016-07-06 DIAGNOSIS — D693 Immune thrombocytopenic purpura: Secondary | ICD-10-CM

## 2016-07-08 ENCOUNTER — Other Ambulatory Visit: Payer: Self-pay | Admitting: *Deleted

## 2016-07-08 DIAGNOSIS — D693 Immune thrombocytopenic purpura: Secondary | ICD-10-CM

## 2016-07-08 MED ORDER — ELTROMBOPAG OLAMINE 25 MG PO TABS: 25.0000 mg | ORAL_TABLET | Freq: Every day | ORAL | 1 refills | Status: DC

## 2016-07-20 ENCOUNTER — Other Ambulatory Visit: Payer: Medicaid Other

## 2016-07-24 ENCOUNTER — Other Ambulatory Visit: Payer: Self-pay | Admitting: Hematology

## 2016-07-26 ENCOUNTER — Other Ambulatory Visit: Payer: Self-pay | Admitting: *Deleted

## 2016-07-26 MED ORDER — LEVOTHYROXINE SODIUM 100 MCG PO TABS: 100.0000 ug | ORAL_TABLET | Freq: Every day | ORAL | 1 refills | Status: DC

## 2016-08-05 ENCOUNTER — Encounter: Payer: Self-pay | Admitting: *Deleted

## 2016-08-05 NOTE — Progress Notes (Signed)
Inbasket sent to reschedule patient appointment due to the weather

## 2016-08-06 ENCOUNTER — Other Ambulatory Visit: Payer: Medicaid Other

## 2016-08-06 ENCOUNTER — Telehealth: Payer: Self-pay | Admitting: *Deleted

## 2016-08-06 ENCOUNTER — Ambulatory Visit: Payer: Medicaid Other | Admitting: Hematology

## 2016-08-06 NOTE — Telephone Encounter (Signed)
Per 1/18 staff message I have attempted to schedule appts. I called and left message on the machine to the office back

## 2016-08-11 ENCOUNTER — Telehealth: Payer: Self-pay | Admitting: Hematology

## 2016-08-11 NOTE — Telephone Encounter (Signed)
Spoke with patient re 1/29 lab/fu.  Rescheduled due to inclement weather per staff message from desk nurse

## 2016-08-16 ENCOUNTER — Telehealth: Payer: Self-pay | Admitting: Hematology

## 2016-08-16 ENCOUNTER — Encounter: Payer: Self-pay | Admitting: Hematology

## 2016-08-16 ENCOUNTER — Other Ambulatory Visit (HOSPITAL_BASED_OUTPATIENT_CLINIC_OR_DEPARTMENT_OTHER): Payer: Medicaid Other

## 2016-08-16 ENCOUNTER — Ambulatory Visit (HOSPITAL_BASED_OUTPATIENT_CLINIC_OR_DEPARTMENT_OTHER): Payer: Medicaid Other | Admitting: Hematology

## 2016-08-16 VITALS — BP 138/76 | HR 86 | Temp 98.1°F | Resp 18 | Ht 62.0 in | Wt 301.8 lb

## 2016-08-16 DIAGNOSIS — D693 Immune thrombocytopenic purpura: Secondary | ICD-10-CM

## 2016-08-16 DIAGNOSIS — D509 Iron deficiency anemia, unspecified: Secondary | ICD-10-CM

## 2016-08-16 DIAGNOSIS — E039 Hypothyroidism, unspecified: Secondary | ICD-10-CM | POA: Diagnosis not present

## 2016-08-16 LAB — COMPREHENSIVE METABOLIC PANEL
ALBUMIN: 3.8 g/dL (ref 3.5–5.0)
ALK PHOS: 97 U/L (ref 40–150)
ALT: 20 U/L (ref 0–55)
AST: 17 U/L (ref 5–34)
Anion Gap: 10 mEq/L (ref 3–11)
BUN: 9.8 mg/dL (ref 7.0–26.0)
CALCIUM: 9.5 mg/dL (ref 8.4–10.4)
CO2: 26 mEq/L (ref 22–29)
Chloride: 105 mEq/L (ref 98–109)
Creatinine: 0.8 mg/dL (ref 0.6–1.1)
Glucose: 88 mg/dl (ref 70–140)
Potassium: 4.2 mEq/L (ref 3.5–5.1)
Sodium: 140 mEq/L (ref 136–145)
Total Bilirubin: 0.47 mg/dL (ref 0.20–1.20)
Total Protein: 7.6 g/dL (ref 6.4–8.3)

## 2016-08-16 LAB — CBC & DIFF AND RETIC
BASO%: 0.2 % (ref 0.0–2.0)
Basophils Absolute: 0 10*3/uL (ref 0.0–0.1)
EOS ABS: 0.1 10*3/uL (ref 0.0–0.5)
EOS%: 0.9 % (ref 0.0–7.0)
HCT: 35.9 % (ref 34.8–46.6)
HGB: 11.9 g/dL (ref 11.6–15.9)
Immature Retic Fract: 12.9 % — ABNORMAL HIGH (ref 1.60–10.00)
LYMPH%: 26.1 % (ref 14.0–49.7)
MCH: 23.8 pg — AB (ref 25.1–34.0)
MCHC: 33.1 g/dL (ref 31.5–36.0)
MCV: 71.9 fL — AB (ref 79.5–101.0)
MONO#: 0.4 10*3/uL (ref 0.1–0.9)
MONO%: 7.1 % (ref 0.0–14.0)
NEUT%: 65.7 % (ref 38.4–76.8)
NEUTROS ABS: 3.6 10*3/uL (ref 1.5–6.5)
Platelets: 91 10*3/uL — ABNORMAL LOW (ref 145–400)
RBC: 4.99 10*6/uL (ref 3.70–5.45)
RDW: 16.3 % — ABNORMAL HIGH (ref 11.2–14.5)
Retic %: 1.07 % (ref 0.70–2.10)
Retic Ct Abs: 53.39 10*3/uL (ref 33.70–90.70)
WBC: 5.5 10*3/uL (ref 3.9–10.3)
lymph#: 1.4 10*3/uL (ref 0.9–3.3)

## 2016-08-16 NOTE — Telephone Encounter (Signed)
Appointments scheduled per 1/29 LOS. Patient given AVS report and calendars with future scheduled appointments. °

## 2016-08-16 NOTE — Progress Notes (Signed)
HEMATOLOGY/ONCOLOGY CLINIC NOTE  Date of Service: 08/16/2016  PCP: [place] [facility] COMPLAINTS/PURPOSE OF CONSULTATION:  Chronic ITP now with relapse  HISTORY OF PRESENTING ILLNESS:   Kiara Barrera is a wonderful 41 y.o. female who has been referred to [place] by [facility] for evaluation and management of Chronic ITP.  Patient was been managed for her chronic ITP by Dr Yvone Neu at [facility] center in [place], [place] and recently moved to [place] and wants to establish care here.  As per review of outside records the patient was diagnosed with ITP since 2012 in [place] and has been treated with prednisone on off since then. She apparently had significant weight gain with prednisone and had chronically low platelets despite the prednisone. She was then started on Promacta 50 mg by mouth daily in June 2016 and had a very good response with platelet counts of more than 500k. Her Promacta was discontinued in July 2016. She had relapse of her ITP in September 2016 with platelet count was down to 49k any significant bleeding or bruising. He has been on variable doses of Promacta since then and reports that she is currently taking 25 mg on Monday Wednesday and Friday.  He has had previous iron deficiency due to heavy periods which was treated with oral iron replacement.  INTERVAL HISTORY  Ms Kiara Barrera is here for followup of her ITP. She notes no issues with bleeding. She has been compliant taking her Promacta at 25 mg by mouth daily And has had no acute issues with this. Platelet counts are 91k up from 83k  4 weeks ago and from 61k 6 weeks ago. No other acute new symptoms. She is taking her iron polysaccharide BID for iron deficiency anemia without any concerns.  MEDICAL HISTORY:  #1 chronic ITP #2 Iron deficiency anemia due to heavy periods #3 hypothyroidism #4 degenerative  arthritis #5 anxiety disorder #6 history of pulmonary embolism in 2004 status post IVC filter placement. 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 #7 migraine headaches  SURGICAL HISTORY: Past Surgical History:  Procedure Laterality Date  . C secton  2002  . IVC FILTER PLACEMENT ([place] [facility] [facility] HX)  2004  . Left axillary lymph node excision biopsy  2008    SOCIAL HISTORY: Social History   Social History  . Marital status: Single    Spouse name: N/A  . Number of children: N/A  . Years of education: N/A   Occupational History  . Not on file.   Social History Main Topics  . Smoking status: Never Smoker  . Smokeless tobacco: Never Used  . Alcohol use Yes     Comment: occasional: not that often  . Drug use: No  . Sexual activity: Not on file   Other Topics Concern  . Not on file   Social History Narrative  . No narrative on file  Recently moved from [place] , [place] to [place]. Nonsmoker minimal social alcohol use no drug use Currently unemployed Has a 20 year old daughter  FAMILY HISTORY: No known history of blood disorders Mother had a history of cervical cancer  maternal grandmother diabetes type 2 Maternal uncle diabetes type 2  ALLERGIES:  has No Known Allergies.  MEDICATIONS:  Current Outpatient Prescriptions  Medication Sig Dispense Refill  . Calcium Carbonate-Vitamin D (  CALCIUM 600/VITAMIN D PO) Take by mouth.    . eltrombopag (PROMACTA) 25 MG tablet Take 1 tablet (25 mg total) by mouth daily. Take on an empty stomach, 1 hour before a meal or 2 hours after. 30 tablet 1  . iron polysaccharides (NIFEREX) 150 MG capsule Take 1 capsule (150 mg total) by mouth 2 (two) times daily. (Patient taking differently: Take 150 mg by mouth daily. )    . levothyroxine (SYNTHROID, LEVOTHROID) 100 MCG tablet Take 1 tablet (100 mcg total) by mouth daily before breakfast. 30  tablet 1  . levothyroxine (SYNTHROID, LEVOTHROID) 100 MCG tablet Take 1 tablet (100 mcg total) by mouth daily before breakfast. 30 tablet 1  . Multiple Vitamin (MULTIVITAMIN) tablet Take 1 tablet by mouth daily.     No current facility-administered medications for this visit.     REVIEW OF SYSTEMS:    10 Point review of Systems was done is negative except as noted above.  PHYSICAL EXAMINATION: ECOG PERFORMANCE STATUS: 1 - Symptomatic but completely ambulatory  . Vitals:   08/16/16 1037  BP: 138/76  Pulse: 86  Resp: 18  Temp: 98.1 F (36.7 C)   Filed Weights   08/16/16 1037  Weight: (!) 301 lb 12.8 oz (136.9 kg)   .Body mass index is 55.2 kg/m.  GENERAL:alert, in no acute distress and comfortable SKIN: skin color, texture, turgor are normal, no rashes or significant lesions EYES: normal, conjunctiva are pink and non-injected, sclera clear OROPHARYNX:no exudate, no erythema and lips, buccal mucosa, and tongue normal  NECK: supple, no JVD, thyroid normal size, non-tender, without nodularity LYMPH:  no palpable lymphadenopathy in the cervical, axillary or inguinal LUNGS: clear to auscultation with normal respiratory effort HEART: regular rate & rhythm,  no murmurs and no lower extremity edema ABDOMEN: abdomen soft, non-tender, normoactive bowel sounds  Musculoskeletal: no cyanosis of digits and no clubbing  PSYCH: alert & oriented x 3 with fluent speech NEURO: no focal motor/sensory deficits  LABORATORY DATA:  I have reviewed the data as listed  . CBC Latest Ref Rng & Units 08/16/2016 07/05/2016 06/21/2016  WBC 3.9 - 10.3 10e3/uL 5.5 4.7 5.2  Hemoglobin 11.6 - 15.9 g/dL 11.9 11.4(L) 11.3(L)  Hematocrit 34.8 - 46.6 % 35.9 34.6(L) 34.2(L)  Platelets 145 - 400 10e3/uL 91(L) 83(L) 61(L)   . CMP Latest Ref Rng & Units 08/16/2016 07/05/2016 06/07/2016  Glucose 70 - 140 mg/dl 88 93 91  BUN 7.0 - 26.0 mg/dL 9.8 5.1(L) 11.9  Creatinine 0.6 - 1.1 mg/dL 0.8 0.7 0.8  Sodium 136  - 145 mEq/L 140 142 140  Potassium 3.5 - 5.1 mEq/L 4.2 4.1 4.4  CO2 22 - 29 mEq/L 26 27 23   Calcium 8.4 - 10.4 mg/dL 9.5 9.3 9.4  Total Protein 6.4 - 8.3 g/dL 7.6 7.7 8.0  Total Bilirubin 0.20 - 1.20 mg/dL 0.47 0.39 0.36  Alkaline Phos 40 - 150 U/L 97 100 109  AST 5 - 34 U/L 17 23 20   ALT 0 - 55 U/L 20 23 16     RADIOGRAPHIC STUDIES: I have personally reviewed the radiological images as listed and agreed with the findings in the report. No results found.  ASSESSMENT & PLAN:   41 year old African-American female with   1) Chronic ITP since 2012 now with acute relapse of her ITP with very labile platelet counts No overt bleeding at this time. Patient has been on and off steroids and has been on promacta since 2016. Was apparently on Promacta 25mg  po  MWF. No issues with bleeding at this time. No petechiae B12 wnl Ultrasound abdomen did show some mild splenomegaly which could be an additional factor in her thrombocytopenia. The spleen measures 12.9 x 13.9 x 6.7 cm. The calculated volume is 628 cc. Unclear etiology of splenomegaly. Liver appeared normal.  Platelets stable and adequate at 91k  Plan --Continue Promacta 25 mg by mouth daily . No indication for dose increase at this time. --Repeat CBC in 4 weeks -Return to clinic with Dr. Irene Limbo in 8 weeks with repeat CBC again. -absolutely avoid NSAIDS, ASA and similar products - if still remains very labile with promacta despite appropriate dose adjustment will need to consider other treatments including Rituxan or splenectomy. -Patient counseled on importance of weight loss and remaining physically active.  2) Iron deficiency Anemia - likely from heavy menstrual losses. Hemoglobin has improved from 10.5 to11.6 to 11.9 but still with significant microcytosis. Tolerating the po iron without any significant issues Plan -Continue oral iron replacement to iron polysaccharide 150 mg by mouth twice a day -will rpt ferritin/Iron profile on  next clinic appointment in 8 weeks  3) Hypothyroidism - on levothyroxine  4) . Patient Active Problem List   Diagnosis Date Noted  . Chronic ITP (idiopathic thrombocytopenia) (HCC) 05/03/2016  . Thrombocytopenia ([place]) 04/19/2016  . Iron deficiency anemia 04/19/2016  . Hypothyroidism 04/19/2016  . Degenerative arthritis 04/19/2016  . Generalized anxiety disorder 04/19/2016   -continue f/u with PCP  5) Obesity  -discussed lifestyle modifications including need for appropriate diet and exercise.  6) H/o PE in 2004-- still has IVC filter in situ. Thought to be triggered by immobility. -not on anticoagulation at this time.  Rpt labs in 4 weeks RTC with Dr Irene Limbo in 8 week with rpt labs.   All of the patients questions were answered with apparent satisfaction. The patient knows to call the clinic with any problems, questions or concerns.  I spent 25 minutes counseling the patient face to face. The total time spent in the appointment was 25 minutes and more than 50% was on counseling and direct patient cares.    Sullivan Lone MD [place] AAHIVMS [facility] Hematology/Oncology Physician [facility]  (Office):       [phone] (Work cell):  [phone] (Fax):           [phone]

## 2016-08-25 ENCOUNTER — Telehealth: Payer: Self-pay | Admitting: *Deleted

## 2016-08-25 NOTE — Telephone Encounter (Signed)
Left voicemail for patient

## 2016-08-25 NOTE — Telephone Encounter (Signed)
-----   Message from Brunetta Genera, MD sent at 08/22/2016  8:46 PM EST ----- Plz let Ms [place] know her platelets are stable at 91k -- continue current dose of promacta -- will see her per clinic appointment. thx GK

## 2016-09-13 ENCOUNTER — Other Ambulatory Visit (HOSPITAL_BASED_OUTPATIENT_CLINIC_OR_DEPARTMENT_OTHER): Payer: Medicaid Other

## 2016-09-13 DIAGNOSIS — D693 Immune thrombocytopenic purpura: Secondary | ICD-10-CM | POA: Diagnosis not present

## 2016-09-13 LAB — CBC & DIFF AND RETIC
BASO%: 0.2 % (ref 0.0–2.0)
BASOS ABS: 0 10*3/uL (ref 0.0–0.1)
EOS%: 0.9 % (ref 0.0–7.0)
Eosinophils Absolute: 0.1 10*3/uL (ref 0.0–0.5)
HEMATOCRIT: 35.5 % (ref 34.8–46.6)
HEMOGLOBIN: 11.8 g/dL (ref 11.6–15.9)
Immature Retic Fract: 11.3 % — ABNORMAL HIGH (ref 1.60–10.00)
LYMPH%: 21.8 % (ref 14.0–49.7)
MCH: 24 pg — AB (ref 25.1–34.0)
MCHC: 33.2 g/dL (ref 31.5–36.0)
MCV: 72.3 fL — ABNORMAL LOW (ref 79.5–101.0)
MONO#: 0.5 10*3/uL (ref 0.1–0.9)
MONO%: 7.6 % (ref 0.0–14.0)
NEUT#: 4.5 10*3/uL (ref 1.5–6.5)
NEUT%: 69.5 % (ref 38.4–76.8)
Platelets: 55 10*3/uL — ABNORMAL LOW (ref 145–400)
RBC: 4.91 10*6/uL (ref 3.70–5.45)
RDW: 15.6 % — AB (ref 11.2–14.5)
RETIC %: 1.2 % (ref 0.70–2.10)
Retic Ct Abs: 58.92 10*3/uL (ref 33.70–90.70)
WBC: 6.4 10*3/uL (ref 3.9–10.3)
lymph#: 1.4 10*3/uL (ref 0.9–3.3)

## 2016-09-16 ENCOUNTER — Other Ambulatory Visit: Payer: Self-pay | Admitting: *Deleted

## 2016-09-16 DIAGNOSIS — D693 Immune thrombocytopenic purpura: Secondary | ICD-10-CM

## 2016-09-16 MED ORDER — ELTROMBOPAG OLAMINE 25 MG PO TABS: 25.0000 mg | ORAL_TABLET | Freq: Every day | ORAL | 1 refills | Status: DC

## 2016-09-23 ENCOUNTER — Other Ambulatory Visit: Payer: Self-pay | Admitting: Hematology

## 2016-10-01 ENCOUNTER — Other Ambulatory Visit: Payer: Self-pay | Admitting: *Deleted

## 2016-10-01 MED ORDER — LEVOTHYROXINE SODIUM 100 MCG PO TABS: 100.0000 ug | ORAL_TABLET | Freq: Every day | ORAL | 1 refills | Status: DC

## 2016-10-11 ENCOUNTER — Ambulatory Visit (HOSPITAL_BASED_OUTPATIENT_CLINIC_OR_DEPARTMENT_OTHER): Payer: Medicaid Other

## 2016-10-11 ENCOUNTER — Other Ambulatory Visit: Payer: Medicaid Other

## 2016-10-11 ENCOUNTER — Telehealth: Payer: Self-pay | Admitting: Hematology

## 2016-10-11 ENCOUNTER — Ambulatory Visit (HOSPITAL_BASED_OUTPATIENT_CLINIC_OR_DEPARTMENT_OTHER): Payer: Medicaid Other | Admitting: Hematology

## 2016-10-11 ENCOUNTER — Encounter (INDEPENDENT_AMBULATORY_CARE_PROVIDER_SITE_OTHER): Payer: Self-pay

## 2016-10-11 VITALS — BP 123/70 | HR 75 | Temp 98.0°F | Resp 20 | Ht 62.0 in | Wt 314.5 lb

## 2016-10-11 DIAGNOSIS — E039 Hypothyroidism, unspecified: Secondary | ICD-10-CM | POA: Diagnosis not present

## 2016-10-11 DIAGNOSIS — D509 Iron deficiency anemia, unspecified: Secondary | ICD-10-CM

## 2016-10-11 DIAGNOSIS — D693 Immune thrombocytopenic purpura: Secondary | ICD-10-CM

## 2016-10-11 LAB — CBC & DIFF AND RETIC
BASO%: 0.2 % (ref 0.0–2.0)
BASOS ABS: 0 10*3/uL (ref 0.0–0.1)
EOS%: 1.1 % (ref 0.0–7.0)
Eosinophils Absolute: 0.1 10*3/uL (ref 0.0–0.5)
HEMATOCRIT: 35.3 % (ref 34.8–46.6)
HEMOGLOBIN: 12 g/dL (ref 11.6–15.9)
IMMATURE RETIC FRACT: 10.7 % — AB (ref 1.60–10.00)
LYMPH#: 1.8 10*3/uL (ref 0.9–3.3)
LYMPH%: 28.1 % (ref 14.0–49.7)
MCH: 24.6 pg — ABNORMAL LOW (ref 25.1–34.0)
MCHC: 34 g/dL (ref 31.5–36.0)
MCV: 72.5 fL — ABNORMAL LOW (ref 79.5–101.0)
MONO#: 0.5 10*3/uL (ref 0.1–0.9)
MONO%: 8.4 % (ref 0.0–14.0)
NEUT#: 4 10*3/uL (ref 1.5–6.5)
NEUT%: 62.2 % (ref 38.4–76.8)
PLATELETS: 51 10*3/uL — AB (ref 145–400)
RBC: 4.87 10*6/uL (ref 3.70–5.45)
RDW: 15.4 % — ABNORMAL HIGH (ref 11.2–14.5)
RETIC %: 1.59 % (ref 0.70–2.10)
RETIC CT ABS: 77.43 10*3/uL (ref 33.70–90.70)
WBC: 6.5 10*3/uL (ref 3.9–10.3)
nRBC: 0 % (ref 0–0)

## 2016-10-11 LAB — COMPREHENSIVE METABOLIC PANEL
ALBUMIN: 3.6 g/dL (ref 3.5–5.0)
ALK PHOS: 88 U/L (ref 40–150)
ALT: 24 U/L (ref 0–55)
ANION GAP: 9 meq/L (ref 3–11)
AST: 19 U/L (ref 5–34)
BILIRUBIN TOTAL: 0.35 mg/dL (ref 0.20–1.20)
BUN: 11.6 mg/dL (ref 7.0–26.0)
CALCIUM: 9.2 mg/dL (ref 8.4–10.4)
CHLORIDE: 106 meq/L (ref 98–109)
CO2: 25 mEq/L (ref 22–29)
Creatinine: 0.7 mg/dL (ref 0.6–1.1)
EGFR: >90 mL/min/{1.73_m2} (ref >90)
Glucose: 75 mg/dl (ref 70–140)
Potassium: 4 mEq/L (ref 3.5–5.1)
Sodium: 139 mEq/L (ref 136–145)
TOTAL PROTEIN: 7.6 g/dL (ref 6.4–8.3)

## 2016-10-11 LAB — IRON AND TIBC
%SAT: 11 % — AB (ref 21–57)
Iron: 44 ug/dL (ref 41–142)
TIBC: 411 ug/dL (ref 236–444)
UIBC: 367 ug/dL (ref 120–384)

## 2016-10-11 LAB — FERRITIN: FERRITIN: 39 ng/mL (ref 9–269)

## 2016-10-11 MED ORDER — B-12 1000 MCG SL SUBL: 1000.0000 ug | SUBLINGUAL_TABLET | Freq: Every day | SUBLINGUAL | 3 refills | Status: DC

## 2016-10-11 NOTE — Telephone Encounter (Signed)
Gave patient AVS and calender  Per 10/11/2016  [place]

## 2016-10-12 NOTE — Progress Notes (Signed)
HEMATOLOGY/ONCOLOGY CLINIC NOTE  Date of Service: 10/12/2016  PCP: [place] [facility] COMPLAINTS/PURPOSE OF CONSULTATION:  Chronic ITP now with relapse  HISTORY OF PRESENTING ILLNESS:   Kiara Barrera is a wonderful 41 y.o. female who has been referred to [place] by [facility] for evaluation and management of Chronic ITP.  Patient was been managed for her chronic ITP by Dr Yvone Neu at [facility] in [place], [place] and recently moved to [place] and wants to establish care here.  As per review of outside records the patient was diagnosed with ITP since 2012 in [place] and has been treated with prednisone on off since then. She apparently had significant weight gain with prednisone and had chronically low platelets despite the prednisone. She was then started on Promacta 50 mg by mouth daily in June 2016 and had a very good response with platelet counts of more than 500k. Her Promacta was discontinued in July 2016. She had relapse of her ITP in September 2016 with platelet count was down to 49k any significant bleeding or bruising. He has been on variable doses of Promacta since then and reports that she is currently taking 25 mg on Monday Wednesday and Friday.  He has had previous iron deficiency due to heavy periods which was treated with oral iron replacement.  INTERVAL HISTORY  Kiara Barrera is here for followup of her ITP. She notes no issues with bleeding. She has been compliant taking her Promacta at 25 mg by mouth daily And has had no acute issues with this. Platelet counts are trending downwards at 51k today from 55k and 91k previously. No new medications. No other acute new symptoms. She is taking her iron polysaccharide BID for iron deficiency anemia without any concerns.  MEDICAL HISTORY:  #1 chronic ITP #2 Iron deficiency anemia due to heavy periods #3  hypothyroidism #4 degenerative arthritis #5 anxiety disorder #6 history of pulmonary embolism in 2004 status post IVC filter placement. 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 #7 migraine headaches  SURGICAL HISTORY: Past Surgical History:  Procedure Laterality Date  . C secton  2002  . IVC FILTER PLACEMENT ([facility])  2004  . Left axillary lymph node excision biopsy  2008    SOCIAL HISTORY: Social History   Social History  . Marital status: Single    Spouse name: N/A  . Number of children: N/A  . Years of education: N/A   Occupational History  . Not on file.   Social History Main Topics  . Smoking status: Never Smoker  . Smokeless tobacco: Never Used  . Alcohol use Yes     Comment: occasional: not that often  . Drug use: No  . Sexual activity: Not on file   Other Topics Concern  . Not on file   Social History Narrative  . No narrative on file  Recently moved from [place] , [place] to [place]. Nonsmoker minimal social alcohol use no drug use Currently unemployed Has a 97 year old daughter  FAMILY HISTORY: No known history of blood disorders Mother had a history of cervical cancer  maternal grandmother diabetes type 2 Maternal uncle diabetes type 2  ALLERGIES:  has No Known Allergies.  MEDICATIONS:  Current Outpatient Prescriptions  Medication Sig Dispense Refill  . Calcium Carbonate-Vitamin D (CALCIUM  600/VITAMIN D PO) Take by mouth.    . Cyanocobalamin (B-12) 1000 MCG SUBL Place 1,000 mcg under the tongue daily. 30 each 3  . eltrombopag (PROMACTA) 25 MG tablet Take 1 tablet (25 mg total) by mouth daily. Take on an empty stomach, 1 hour before a meal or 2 hours after. 30 tablet 1  . iron polysaccharides (NIFEREX) 150 MG capsule Take 1 capsule (150 mg total) by mouth 2 (two) times daily. (Patient taking differently: Take 150 mg by mouth daily. )    .  levothyroxine (SYNTHROID, LEVOTHROID) 100 MCG tablet Take 1 tablet (100 mcg total) by mouth daily before breakfast. 30 tablet 1  . levothyroxine (SYNTHROID, LEVOTHROID) 100 MCG tablet Take 1 tablet (100 mcg total) by mouth daily before breakfast. 30 tablet 1  . Multiple Vitamin (MULTIVITAMIN) tablet Take 1 tablet by mouth daily.     No current facility-administered medications for this visit.     REVIEW OF SYSTEMS:    10 Point review of Systems was done is negative except as noted above.  PHYSICAL EXAMINATION: ECOG PERFORMANCE STATUS: 1 - Symptomatic but completely ambulatory  . Vitals:   10/11/16 1021  BP: 123/70  Pulse: 75  Resp: 20  Temp: 98 F (36.7 C)   Filed Weights   10/11/16 1021  Weight: (!) 314 lb 8 oz (142.7 kg)   .Body mass index is 57.52 kg/m.  GENERAL:alert, in no acute distress and comfortable SKIN: skin color, texture, turgor are normal, no rashes or significant lesions EYES: normal, conjunctiva are pink and non-injected, sclera clear OROPHARYNX:no exudate, no erythema and lips, buccal mucosa, and tongue normal  NECK: supple, no JVD, thyroid normal size, non-tender, without nodularity LYMPH:  no palpable lymphadenopathy in the cervical, axillary or inguinal LUNGS: clear to auscultation with normal respiratory effort HEART: regular rate & rhythm,  no murmurs and no lower extremity edema ABDOMEN: abdomen soft, non-tender, normoactive bowel sounds  Musculoskeletal: no cyanosis of digits and no clubbing  PSYCH: alert & oriented x 3 with fluent speech NEURO: no focal motor/sensory deficits  LABORATORY DATA:  I have reviewed the data as listed  . CBC Latest Ref Rng & Units 10/11/2016 09/13/2016 08/16/2016  WBC 3.9 - 10.3 10e3/uL 6.5 6.4 5.5  Hemoglobin 11.6 - 15.9 g/dL 12.0 11.8 11.9  Hematocrit 34.8 - 46.6 % 35.3 35.5 35.9  Platelets 145 - 400 10e3/uL 51(L) 55(L) 91(L)   .Marland Kitchen CBC    Component Value Date/Time   WBC 6.5 10/11/2016 1123   RBC 4.87  10/11/2016 1123   HGB 12.0 10/11/2016 1123   HCT 35.3 10/11/2016 1123   PLT 51 (L) 10/11/2016 1123   MCV 72.5 (L) 10/11/2016 1123   MCH 24.6 (L) 10/11/2016 1123   MCHC 34.0 10/11/2016 1123   RDW 15.4 (H) 10/11/2016 1123   LYMPHSABS 1.8 10/11/2016 1123   MONOABS 0.5 10/11/2016 1123   EOSABS 0.1 10/11/2016 1123   BASOSABS 0.0 10/11/2016 1123    CMP Latest Ref Rng & Units 10/11/2016 08/16/2016 07/05/2016  Glucose 70 - 140 mg/dl 75 88 93  BUN 7.0 - 26.0 mg/dL 11.6 9.8 5.1(L)  Creatinine 0.6 - 1.1 mg/dL 0.7 0.8 0.7  Sodium 136 - 145 mEq/L 139 140 142  Potassium 3.5 - 5.1 mEq/L 4.0 4.2 4.1  CO2 22 - 29 mEq/L 25 26 27   Calcium 8.4 - 10.4 mg/dL 9.2 9.5 9.3  Total Protein 6.4 - 8.3 g/dL 7.6 7.6 7.7  Total Bilirubin 0.20 - 1.20 mg/dL 0.35 0.47 0.39  Alkaline Phos 40 - 150 U/L 88 97 100  AST 5 - 34 U/L 19 17 23   ALT 0 - 55 U/L 24 20 23     RADIOGRAPHIC STUDIES: I have personally reviewed the radiological images as listed and agreed with the findings in the report. No results found.  ASSESSMENT & PLAN:   41 year old African-American female with   1) Chronic ITP since 2012 now with acute relapse of her ITP with very labile platelet counts No overt bleeding at this time. Patient has been on and off steroids and has been on promacta since 2016. Was apparently on Promacta 25mg  po MWF. No issues with bleeding at this time. No petechiae B12 wnl Ultrasound abdomen 06/02/2016- did show some mild splenomegaly which could be an additional factor in her thrombocytopenia. The spleen measures 12.9 x 13.9 x 6.7 cm. The calculated volume is 628 cc. Unclear etiology of splenomegaly. Liver appeared normal.  Platelets trending downwards down to 51k.  Plan --rpt cbc in 4 weeks if continues to trend downwards with increase Promacta to 50mg  po daily  -Return to clinic with Dr. Irene Limbo in 8 weeks with repeat CBC again. -absolutely avoid NSAIDS, ASA and similar products -- if still remains very labile with  promacta despite appropriate dose adjustment will need to consider other treatments including Rituxan or splenectomy. -Patient counseled on importance of weight loss and remaining physically active. -patient was broadly Thinking about the idea of getting pregnant. We discussed that ITP can worsen during pregnancy and that other than steroids and IVIG most medications were not studied or approved during pregnancy. -We discussed that if this is truly a decision she is making we would recommend consideration of splenectomy with appropriate pre-splenectomy vaccinations.  2) Iron deficiency Anemia - likely from heavy menstrual losses. Hemoglobin has improved from 10.5 to11.6 to 12 but still with significant microcytosis. Tolerating the po iron without any significant issues . Lab Results  Component Value Date   IRON 44 10/11/2016   TIBC 411 10/11/2016   IRONPCTSAT 11 (L) 10/11/2016   (Iron and TIBC)  Lab Results  Component Value Date   FERRITIN 39 10/11/2016  Plan -Continue oral iron replacement to iron polysaccharide 150 mg by mouth twice a day  3)  Hypothyroidism - on levothyroxine  4) . Patient Active Problem List   Diagnosis Date Noted  . Chronic ITP (idiopathic thrombocytopenia) (HCC) 05/03/2016  . Thrombocytopenia ([facility]) 04/19/2016  . Iron deficiency anemia 04/19/2016  . Hypothyroidism 04/19/2016  . Degenerative arthritis 04/19/2016  . Generalized anxiety disorder 04/19/2016   -continue f/u with PCP  5) Obesity  -discussed lifestyle modifications including need for appropriate diet and exercise.  6) H/o PE in 2004-- still has IVC filter in situ. Thought to be triggered by immobility. -not on anticoagulation at this time.  Rpt labs in 4 weeks RTC with Dr Irene Limbo in 8 week with rpt labs.   All of the patients questions were answered with apparent satisfaction. The patient knows to call the clinic with any problems, questions or concerns.  I spent 20 minutes counseling the  patient face to face. The total time spent in the appointment was 25 minutes and more than 50% was on counseling and direct patient cares.    Sullivan Lone MD [place] AAHIVMS [facility] Hematology/Oncology Physician [facility]  (Office):       [phone] (Work cell):  [phone] (Fax):           [phone]

## 2016-10-13 ENCOUNTER — Ambulatory Visit (INDEPENDENT_AMBULATORY_CARE_PROVIDER_SITE_OTHER): Payer: Medicaid Other | Admitting: Obstetrics and Gynecology

## 2016-10-13 ENCOUNTER — Encounter: Payer: Self-pay | Admitting: Obstetrics and Gynecology

## 2016-10-13 ENCOUNTER — Other Ambulatory Visit (HOSPITAL_COMMUNITY)
Admission: RE | Admit: 2016-10-13 | Discharge: 2016-10-13 | Disposition: A | Discharge status: Home / Self Care | Payer: Medicaid Other | Source: Ambulatory Visit | Attending: Obstetrics and Gynecology | Admitting: Obstetrics and Gynecology

## 2016-10-13 DIAGNOSIS — Z01419 Encounter for gynecological examination (general) (routine) without abnormal findings: Secondary | ICD-10-CM | POA: Diagnosis present

## 2016-10-13 DIAGNOSIS — A5901 Trichomonal vulvovaginitis: Secondary | ICD-10-CM | POA: Insufficient documentation

## 2016-10-13 DIAGNOSIS — Z202 Contact with and (suspected) exposure to infections with a predominantly sexual mode of transmission: Secondary | ICD-10-CM

## 2016-10-13 NOTE — Patient Instructions (Signed)
Health Maintenance, Female Adopting a healthy lifestyle and getting preventive care can go a long way to promote health and wellness. Talk with your health care provider about what schedule of regular examinations is right for you. This is a good chance for you to check in with your provider about disease prevention and staying healthy. In between checkups, there are plenty of things you can do on your own. Experts have done a lot of research about which lifestyle changes and preventive measures are most likely to keep you healthy. Ask your health care provider for more information. Weight and diet Eat a healthy diet  Be sure to include plenty of vegetables, fruits, low-fat dairy products, and lean protein.  Do not eat a lot of foods high in solid fats, added sugars, or salt.  Get regular exercise. This is one of the most important things you can do for your health.  Most adults should exercise for at least 150 minutes each week. The exercise should increase your heart rate and make you sweat (moderate-intensity exercise).  Most adults should also do strengthening exercises at least twice a week. This is in addition to the moderate-intensity exercise. Maintain a healthy weight  Body mass index (BMI) is a measurement that can be used to identify possible weight problems. It estimates body fat based on height and weight. Your health care provider can help determine your BMI and help you achieve or maintain a healthy weight.  For females 76 years of age and older:  A BMI below 18.5 is considered underweight.  A BMI of 18.5 to 24.9 is normal.  A BMI of 25 to 29.9 is considered overweight.  A BMI of 30 and above is considered obese. Watch levels of cholesterol and blood lipids  You should start having your blood tested for lipids and cholesterol at 40 years of age, then have this test every 5 years.  You may need to have your cholesterol levels checked more often if:  Your lipid or  cholesterol levels are high.  You are older than 40 years of age.  You are at high risk for heart disease. Cancer screening Lung Cancer  Lung cancer screening is recommended for adults 64-42 years old who are at high risk for lung cancer because of a history of smoking.  A yearly low-dose CT scan of the lungs is recommended for people who:  Currently smoke.  Have quit within the past 15 years.  Have at least a 30-pack-year history of smoking. A pack year is smoking an average of one pack of cigarettes a day for 1 year.  Yearly screening should continue until it has been 15 years since you quit.  Yearly screening should stop if you develop a health problem that would prevent you from having lung cancer treatment. Breast Cancer  Practice breast self-awareness. This means understanding how your breasts normally appear and feel.  It also means doing regular breast self-exams. Let your health care provider know about any changes, no matter how small.  If you are in your 20s or 30s, you should have a clinical breast exam (CBE) by a health care provider every 1-3 years as part of a regular health exam.  If you are 34 or older, have a CBE every year. Also consider having a breast X-ray (mammogram) every year.  If you have a family history of breast cancer, talk to your health care provider about genetic screening.  If you are at high risk for breast cancer, talk  to your health care provider about having an MRI and a mammogram every year.  Breast cancer gene (BRCA) assessment is recommended for women who have family members with BRCA-related cancers. BRCA-related cancers include:  Breast.  Ovarian.  Tubal.  Peritoneal cancers.  Results of the assessment will determine the need for genetic counseling and BRCA1 and BRCA2 testing. Cervical Cancer  Your health care provider may recommend that you be screened regularly for cancer of the pelvic organs (ovaries, uterus, and vagina).  This screening involves a pelvic examination, including checking for microscopic changes to the surface of your cervix (Pap test). You may be encouraged to have this screening done every 3 years, beginning at age 24.  For women ages 66-65, health care providers may recommend pelvic exams and Pap testing every 3 years, or they may recommend the Pap and pelvic exam, combined with testing for human papilloma virus (HPV), every 5 years. Some types of HPV increase your risk of cervical cancer. Testing for HPV may also be done on women of any age with unclear Pap test results.  Other health care providers may not recommend any screening for nonpregnant women who are considered low risk for pelvic cancer and who do not have symptoms. Ask your health care provider if a screening pelvic exam is right for you.  If you have had past treatment for cervical cancer or a condition that could lead to cancer, you need Pap tests and screening for cancer for at least 20 years after your treatment. If Pap tests have been discontinued, your risk factors (such as having a new sexual partner) need to be reassessed to determine if screening should resume. Some women have medical problems that increase the chance of getting cervical cancer. In these cases, your health care provider may recommend more frequent screening and Pap tests. Colorectal Cancer  This type of cancer can be detected and often prevented.  Routine colorectal cancer screening usually begins at 40 years of age and continues through 40 years of age.  Your health care provider may recommend screening at an earlier age if you have risk factors for colon cancer.  Your health care provider may also recommend using home test kits to check for hidden blood in the stool.  A small camera at the end of a tube can be used to examine your colon directly (sigmoidoscopy or colonoscopy). This is done to check for the earliest forms of colorectal cancer.  Routine  screening usually begins at age 41.  Direct examination of the colon should be repeated every 5-10 years through 40 years of age. However, you may need to be screened more often if early forms of precancerous polyps or small growths are found. Skin Cancer  Check your skin from head to toe regularly.  Tell your health care provider about any new moles or changes in moles, especially if there is a change in a mole's shape or color.  Also tell your health care provider if you have a mole that is larger than the size of a pencil eraser.  Always use sunscreen. Apply sunscreen liberally and repeatedly throughout the day.  Protect yourself by wearing long sleeves, pants, a wide-brimmed hat, and sunglasses whenever you are outside. Heart disease, diabetes, and high blood pressure  High blood pressure causes heart disease and increases the risk of stroke. High blood pressure is more likely to develop in:  People who have blood pressure in the high end of the normal range (130-139/85-89 mm Hg).  People who are overweight or obese.  People who are African American.  If you are 59-24 years of age, have your blood pressure checked every 3-5 years. If you are 34 years of age or older, have your blood pressure checked every year. You should have your blood pressure measured twice-once when you are at a hospital or clinic, and once when you are not at a hospital or clinic. Record the average of the two measurements. To check your blood pressure when you are not at a hospital or clinic, you can use:  An automated blood pressure machine at a pharmacy.  A home blood pressure monitor.  If you are between 29 years and 60 years old, ask your health care provider if you should take aspirin to prevent strokes.  Have regular diabetes screenings. This involves taking a blood sample to check your fasting blood sugar level.  If you are at a normal weight and have a low risk for diabetes, have this test once  every three years after 40 years of age.  If you are overweight and have a high risk for diabetes, consider being tested at a younger age or more often. Preventing infection Hepatitis B  If you have a higher risk for hepatitis B, you should be screened for this virus. You are considered at high risk for hepatitis B if:  You were born in a country where hepatitis B is common. Ask your health care provider which countries are considered high risk.  Your parents were born in a high-risk country, and you have not been immunized against hepatitis B (hepatitis B vaccine).  You have HIV or AIDS.  You use needles to inject street drugs.  You live with someone who has hepatitis B.  You have had sex with someone who has hepatitis B.  You get hemodialysis treatment.  You take certain medicines for conditions, including cancer, organ transplantation, and autoimmune conditions. Hepatitis C  Blood testing is recommended for:  Everyone born from [birth year] through 1964.  Anyone with known risk factors for hepatitis C. Sexually transmitted infections (STIs)  You should be screened for sexually transmitted infections (STIs) including gonorrhea and chlamydia if:  You are sexually active and are younger than 40 years of age.  You are older than 40 years of age and your health care provider tells you that you are at risk for this type of infection.  Your sexual activity has changed since you were last screened and you are at an increased risk for chlamydia or gonorrhea. Ask your health care provider if you are at risk.  If you do not have HIV, but are at risk, it may be recommended that you take a prescription medicine daily to prevent HIV infection. This is called pre-exposure prophylaxis (PrEP). You are considered at risk if:  You are sexually active and do not regularly use condoms or know the HIV status of your partner(s).  You take drugs by injection.  You are sexually active with a partner  who has HIV. Talk with your health care provider about whether you are at high risk of being infected with HIV. If you choose to begin PrEP, you should first be tested for HIV. You should then be tested every 3 months for as long as you are taking PrEP. Pregnancy  If you are premenopausal and you may become pregnant, ask your health care provider about preconception counseling.  If you may become pregnant, take 400 to 800 micrograms (mcg) of folic acid  every day.  If you want to prevent pregnancy, talk to your health care provider about birth control (contraception). Osteoporosis and menopause  Osteoporosis is a disease in which the bones lose minerals and strength with aging. This can result in serious bone fractures. Your risk for osteoporosis can be identified using a bone density scan.  If you are 4 years of age or older, or if you are at risk for osteoporosis and fractures, ask your health care provider if you should be screened.  Ask your health care provider whether you should take a calcium or vitamin D supplement to lower your risk for osteoporosis.  Menopause may have certain physical symptoms and risks.  Hormone replacement therapy may reduce some of these symptoms and risks. Talk to your health care provider about whether hormone replacement therapy is right for you. Follow these instructions at home:  Schedule regular health, dental, and eye exams.  Stay current with your immunizations.  Do not use any tobacco products including cigarettes, chewing tobacco, or electronic cigarettes.  If you are pregnant, do not drink alcohol.  If you are breastfeeding, limit how much and how often you drink alcohol.  Limit alcohol intake to no more than 1 drink per day for nonpregnant women. One drink equals 12 ounces of beer, 5 ounces of wine, or 1 ounces of hard liquor.  Do not use street drugs.  Do not share needles.  Ask your health care provider for help if you need support  or information about quitting drugs.  Tell your health care provider if you often feel depressed.  Tell your health care provider if you have ever been abused or do not feel safe at home. This information is not intended to replace advice given to you by your health care provider. Make sure you discuss any questions you have with your health care provider. Document Released: 01/18/2011 Document Revised: 12/11/2015 Document Reviewed: 04/08/2015 Elsevier Interactive Patient Education  2017 [employer].

## 2016-10-13 NOTE — Progress Notes (Signed)
Subjective:     Kiara Barrera is a 41 y.o. G68P1001 female here for a routine exam.  She has no GYN complaints. Reports monthly cycles. Sexual active without problems. No contraception.  H/O term c section in the past. 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     Gynecologic History Patient's last menstrual period was 09/23/2016 (exact date). Contraception: none Last Pap: 2016. Results were: normal Last mammogram: NA. Results were: NA  Obstetric History OB History  No data available     The following portions of the patient's history were reviewed and updated as appropriate: allergies, current medications, past family history, past medical history, past social history and past surgical history.  Review of Systems Pertinent items are noted in HPI.    Objective:    BP (!) 169/73   Pulse 99   Temp 97.6 F (36.4 C)   Ht 5\' 2"  (1.575 m)   Wt (!) 310 lb (140.6 kg)   LMP 09/23/2016 (Exact Date)   BMI 56.70 kg/m   General Appearance:    Alert, cooperative, no distress, appears stated age  Head:    Normocephalic, without obvious abnormality, atraumatic  Eyes:    PERRL, conjunctiva/corneas clear, EOM's intact, fundi    benign, both eyes  Ears:    Normal TM's and external ear canals, both ears  Nose:   Nares normal, septum midline, mucosa normal, no drainage    or sinus tenderness  Throat:   Lips, mucosa, and tongue normal; teeth and gums normal  Neck:   Supple, symmetrical, trachea midline, no adenopathy;    thyroid:  no enlargement/tenderness/nodules; no carotid   bruit or JVD  Back:     Symmetric, no curvature, ROM normal, no CVA tenderness  Lungs:     Clear to auscultation bilaterally, respirations unlabored  Chest Wall:    No tenderness or deformity   Heart:    Regular rate and rhythm, S1 and S2 normal, no murmur, rub   or gallop  Breast Exam:    No tenderness, masses, or nipple abnormality  Abdomen:     Soft, non-tender,  bowel sounds active all four quadrants,    no masses, no organomegaly  Genitalia:    Normal female without lesion, discharge or tenderness  Rectal:    Normal tone, normal prostate, no masses or tenderness;   guaiac negative stool  Extremities:   Extremities normal, atraumatic, no cyanosis or edema  Pulses:   2+ and symmetric all extremities  Skin:   Skin color, texture, turgor normal, no rashes or lesions  Lymph nodes:   Cervical, supraclavicular, and axillary nodes normal  Neurologic:   CNII-XII intact, normal strength, sensation and reflexes    throughout      Assessment:    Healthy female exam.   STD exposure  Chronic ITP  H/O PE Plan:    STD testing compelted as per pt request. Pilar Plate conversation regarding desire for pregnancy. Pt informed that would advise against pregnancy d/t medical hisotry and high risk nature. Pt to discuss with husband. As well with Heme/Onc in regards too meds for chronic ITP. Pt advised to seek OB care immmediately with + UPT. She will follow up O/W as per test results, in 1 yr or PRN

## 2016-10-14 ENCOUNTER — Other Ambulatory Visit: Payer: Self-pay | Admitting: Hematology

## 2016-10-14 DIAGNOSIS — D693 Immune thrombocytopenic purpura: Secondary | ICD-10-CM

## 2016-10-15 LAB — HIV ANTIBODY (ROUTINE TESTING W REFLEX): HIV Screen 4th Generation wRfx: NONREACTIVE

## 2016-10-15 LAB — HEPATITIS B SURFACE ANTIGEN: Hepatitis B Surface Ag: NEGATIVE

## 2016-10-15 LAB — RPR, QUANT+TP ABS (REFLEX): TREPONEMA PALLIDUM AB: POSITIVE — AB

## 2016-10-15 LAB — HEPATITIS C ANTIBODY

## 2016-10-15 LAB — RPR: RPR: REACTIVE — AB

## 2016-10-18 LAB — CYTOLOGY - PAP
Bacterial vaginitis: NEGATIVE
Candida vaginitis: NEGATIVE
Chlamydia: NEGATIVE
DIAGNOSIS: NEGATIVE
HPV: DETECTED — AB
Neisseria Gonorrhea: NEGATIVE
TRICH (WINDOWPATH): POSITIVE — AB

## 2016-10-29 ENCOUNTER — Other Ambulatory Visit: Payer: Self-pay

## 2016-10-29 MED ORDER — METRONIDAZOLE 500 MG PO TABS: ORAL_TABLET | ORAL | 0 refills | Status: DC

## 2016-11-08 ENCOUNTER — Other Ambulatory Visit (HOSPITAL_BASED_OUTPATIENT_CLINIC_OR_DEPARTMENT_OTHER): Payer: Medicaid Other

## 2016-11-08 DIAGNOSIS — D693 Immune thrombocytopenic purpura: Secondary | ICD-10-CM

## 2016-11-08 LAB — CBC & DIFF AND RETIC
BASO%: 0.4 % (ref 0.0–2.0)
BASOS ABS: 0 10*3/uL (ref 0.0–0.1)
EOS ABS: 0 10*3/uL (ref 0.0–0.5)
EOS%: 0.7 % (ref 0.0–7.0)
HCT: 35.1 % (ref 34.8–46.6)
HEMOGLOBIN: 11.9 g/dL (ref 11.6–15.9)
IMMATURE RETIC FRACT: 11.1 % — AB (ref 1.60–10.00)
LYMPH#: 1.6 10*3/uL (ref 0.9–3.3)
LYMPH%: 27.7 % (ref 14.0–49.7)
MCH: 25 pg — AB (ref 25.1–34.0)
MCHC: 33.9 g/dL (ref 31.5–36.0)
MCV: 73.7 fL — ABNORMAL LOW (ref 79.5–101.0)
MONO#: 0.4 10*3/uL (ref 0.1–0.9)
MONO%: 7.8 % (ref 0.0–14.0)
NEUT%: 63.4 % (ref 38.4–76.8)
NEUTROS ABS: 3.6 10*3/uL (ref 1.5–6.5)
NRBC: 0 % (ref 0–0)
Platelets: 16 10*3/uL — ABNORMAL LOW (ref 145–400)
RBC: 4.76 10*6/uL (ref 3.70–5.45)
RDW: 15.2 % — AB (ref 11.2–14.5)
RETIC %: 1.64 % (ref 0.70–2.10)
Retic Ct Abs: 78.06 10*3/uL (ref 33.70–90.70)
WBC: 5.6 10*3/uL (ref 3.9–10.3)

## 2016-11-26 ENCOUNTER — Other Ambulatory Visit (HOSPITAL_COMMUNITY)
Admission: RE | Admit: 2016-11-26 | Discharge: 2016-11-26 | Disposition: A | Discharge status: Home / Self Care | Payer: Medicaid Other | Source: Ambulatory Visit | Attending: Obstetrics | Admitting: Obstetrics

## 2016-11-26 ENCOUNTER — Encounter: Payer: Self-pay | Admitting: Obstetrics

## 2016-11-26 ENCOUNTER — Ambulatory Visit (INDEPENDENT_AMBULATORY_CARE_PROVIDER_SITE_OTHER): Payer: Medicaid Other | Admitting: Obstetrics

## 2016-11-26 VITALS — BP 127/76 | HR 87 | Wt 313.7 lb

## 2016-11-26 DIAGNOSIS — Z113 Encounter for screening for infections with a predominantly sexual mode of transmission: Secondary | ICD-10-CM | POA: Diagnosis not present

## 2016-11-26 NOTE — Progress Notes (Signed)
Patient ID: Kiara Barrera, female   DOB: Aug 13, 1975, 41 y.o.   MRN: [id number]  No chief complaint on file.   HPI Kem [place] is a 41 y.o. female.  History of positive Chlamydia culture in March 2018, treated.  Presents for follow up TOC.  No complaints. HPI  History reviewed. No pertinent past medical history.  Past Surgical History:  Procedure Laterality Date  . C secton  2002  . IVC FILTER PLACEMENT ([facility] HX)  2004  . Left axillary lymph node excision biopsy  2008    History reviewed. No pertinent family history.  Social History Social History  Substance Use Topics  . Smoking status: Never Smoker  . Smokeless tobacco: Never Used  . Alcohol use Yes     Comment: occasional: not that often    No Known Allergies  Current Outpatient Prescriptions  Medication Sig Dispense Refill  . Biotin 1000 MCG tablet Take 1,000 mcg by mouth daily.    . Calcium Carbonate-Vitamin D (CALCIUM 600/VITAMIN D PO) Take by mouth.    . Cyanocobalamin (B-12) 1000 MCG SUBL Place 1,000 mcg under the tongue daily. 30 each 3  . iron polysaccharides (NIFEREX) 150 MG capsule Take 1 capsule (150 mg total) by mouth 2 (two) times daily. (Patient taking differently: Take 150 mg by mouth daily. )    . levothyroxine (SYNTHROID, LEVOTHROID) 100 MCG tablet Take 1 tablet (100 mcg total) by mouth daily before breakfast. 30 tablet 1  . levothyroxine (SYNTHROID, LEVOTHROID) 100 MCG tablet Take 1 tablet (100 mcg total) by mouth daily before breakfast. 30 tablet 1  . metroNIDAZOLE (FLAGYL) 500 MG tablet Take 4 tabs po with food STAT 4 tablet 0  . Multiple Vitamin (MULTIVITAMIN) tablet Take 1 tablet by mouth daily.    Marland Kitchen PROMACTA 25 MG tablet TAKE ONE TABLET BY MOUTH ON AN EMPTY STOMACH, 1 HOUR BEFORE A MEAL OR 2 HOURS AFTER. 30 tablet PRN   No current facility-administered medications for this visit.     Review of Systems Review of Systems Constitutional: negative for fatigue and weight loss Respiratory:  negative for cough and wheezing Cardiovascular: negative for chest pain, fatigue and palpitations Gastrointestinal: negative for abdominal pain and change in bowel habits Genitourinary:negative Integument/breast: negative for nipple discharge Musculoskeletal:negative for myalgias Neurological: negative for gait problems and tremors Behavioral/Psych: negative for abusive relationship, depression Endocrine: negative for temperature intolerance      Blood pressure 127/76, pulse 87, weight (!) 313 lb 11.2 oz (142.3 kg), last menstrual period 11/11/2016.  Physical Exam Physical Exam           General:  Alert and no distress Abdomen:  normal findings: no organomegaly, soft, non-tender and no hernia  Pelvis:  External genitalia: normal general appearance Urinary system: urethral meatus normal and bladder without fullness, nontender Vaginal: normal without tenderness, induration or masses Cervix: normal appearance Adnexa: normal bimanual exam Uterus: anteverted and non-tender, normal size    50% of 15 min visit spent on counseling and coordination of care.    Data Reviewed Labs  Assessment     Chlamydia vaginitis, treated Follow up cultures    Plan    Wet prep and cultures done F/U prn   Orders Placed This Encounter  Procedures  . RPR   No orders of the defined types were placed in this encounter.

## 2016-11-26 NOTE — Progress Notes (Signed)
Patient presents for TOC-repeat titer RPR

## 2016-11-27 LAB — RPR: RPR: NONREACTIVE

## 2016-11-29 ENCOUNTER — Telehealth: Payer: Self-pay | Admitting: Hematology

## 2016-11-29 ENCOUNTER — Other Ambulatory Visit (HOSPITAL_BASED_OUTPATIENT_CLINIC_OR_DEPARTMENT_OTHER): Payer: Medicaid Other

## 2016-11-29 ENCOUNTER — Other Ambulatory Visit: Payer: Self-pay | Admitting: *Deleted

## 2016-11-29 ENCOUNTER — Ambulatory Visit (HOSPITAL_BASED_OUTPATIENT_CLINIC_OR_DEPARTMENT_OTHER): Payer: Medicaid Other | Admitting: Hematology

## 2016-11-29 ENCOUNTER — Encounter: Payer: Self-pay | Admitting: Hematology

## 2016-11-29 VITALS — BP 126/74 | HR 78 | Temp 97.8°F | Resp 18 | Ht 62.0 in | Wt 312.7 lb

## 2016-11-29 DIAGNOSIS — Z86711 Personal history of pulmonary embolism: Secondary | ICD-10-CM

## 2016-11-29 DIAGNOSIS — E039 Hypothyroidism, unspecified: Secondary | ICD-10-CM | POA: Diagnosis not present

## 2016-11-29 DIAGNOSIS — D509 Iron deficiency anemia, unspecified: Secondary | ICD-10-CM

## 2016-11-29 DIAGNOSIS — D693 Immune thrombocytopenic purpura: Secondary | ICD-10-CM

## 2016-11-29 DIAGNOSIS — E538 Deficiency of other specified B group vitamins: Secondary | ICD-10-CM

## 2016-11-29 LAB — CBC & DIFF AND RETIC
BASO%: 0.2 % (ref 0.0–2.0)
BASOS ABS: 0 10*3/uL (ref 0.0–0.1)
EOS ABS: 0 10*3/uL (ref 0.0–0.5)
EOS%: 0.8 % (ref 0.0–7.0)
HCT: 35.5 % (ref 34.8–46.6)
HGB: 11.6 g/dL (ref 11.6–15.9)
Immature Retic Fract: 11.5 % — ABNORMAL HIGH (ref 1.60–10.00)
LYMPH%: 30.3 % (ref 14.0–49.7)
MCH: 25.1 pg (ref 25.1–34.0)
MCHC: 32.7 g/dL (ref 31.5–36.0)
MCV: 76.7 fL — ABNORMAL LOW (ref 79.5–101.0)
MONO#: 0.5 10*3/uL (ref 0.1–0.9)
MONO%: 9.7 % (ref 0.0–14.0)
NEUT%: 59 % (ref 38.4–76.8)
NEUTROS ABS: 2.8 10*3/uL (ref 1.5–6.5)
Platelets: 20 10*3/uL — ABNORMAL LOW (ref 145–400)
RBC: 4.63 10*6/uL (ref 3.70–5.45)
RDW: 15.3 % — ABNORMAL HIGH (ref 11.2–14.5)
RETIC CT ABS: 78.25 10*3/uL (ref 33.70–90.70)
Retic %: 1.69 % (ref 0.70–2.10)
WBC: 4.8 10*3/uL (ref 3.9–10.3)
lymph#: 1.4 10*3/uL (ref 0.9–3.3)

## 2016-11-29 LAB — COMPREHENSIVE METABOLIC PANEL
ALT: 17 U/L (ref 0–55)
AST: 18 U/L (ref 5–34)
Albumin: 3.5 g/dL (ref 3.5–5.0)
Alkaline Phosphatase: 86 U/L (ref 40–150)
Anion Gap: 10 mEq/L (ref 3–11)
BUN: 13.9 mg/dL (ref 7.0–26.0)
CO2: 24 meq/L (ref 22–29)
CREATININE: 0.8 mg/dL (ref 0.6–1.1)
Calcium: 9.7 mg/dL (ref 8.4–10.4)
Chloride: 106 mEq/L (ref 98–109)
GLUCOSE: 94 mg/dL (ref 70–140)
Potassium: 4.1 mEq/L (ref 3.5–5.1)
SODIUM: 141 meq/L (ref 136–145)
TOTAL PROTEIN: 7.6 g/dL (ref 6.4–8.3)
Total Bilirubin: 0.33 mg/dL (ref 0.20–1.20)

## 2016-11-29 LAB — CERVICOVAGINAL ANCILLARY ONLY
Bacterial vaginitis: NEGATIVE
Candida vaginitis: NEGATIVE
Chlamydia: NEGATIVE
Neisseria Gonorrhea: NEGATIVE
Trichomonas: NEGATIVE

## 2016-11-29 MED ORDER — ELTROMBOPAG OLAMINE 50 MG PO TABS: 50.0000 mg | ORAL_TABLET | Freq: Every day | ORAL | 2 refills | Status: DC

## 2016-11-29 MED ORDER — LEVOTHYROXINE SODIUM 100 MCG PO TABS: 100.0000 ug | ORAL_TABLET | Freq: Every day | ORAL | 1 refills | Status: DC

## 2016-11-29 NOTE — Telephone Encounter (Signed)
Scheduled appt per 5/14 los . Gave patient AVS and calender per 5/14 los.

## 2016-11-29 NOTE — Progress Notes (Signed)
HEMATOLOGY/ONCOLOGY CLINIC NOTE  Date of Service: 11/29/2016  PCP: [facility] COMPLAINTS/PURPOSE OF CONSULTATION:  Chronic ITP now with relapse  HISTORY OF PRESENTING ILLNESS:   Kiara Barrera is a wonderful 41 y.o. female who has been referred to [place] by [facility] for evaluation and management of Chronic ITP.  Patient was been managed for her chronic ITP by Dr Yvone Neu at [facility] in [place], [place] and recently moved to [place] and wants to establish care here.  As per review of outside records the patient was diagnosed with ITP since 2012 in [place] and has been treated with prednisone on off since then. She apparently had significant weight gain with prednisone and had chronically low platelets despite the prednisone. She was then started on Promacta 50 mg by mouth daily in June 2016 and had a very good response with platelet counts of more than 500k. Her Promacta was discontinued in July 2016. She had relapse of her ITP in September 2016 with platelet count was down to 49k any significant bleeding or bruising. He has been on variable doses of Promacta since then and reports that she is currently taking 25 mg on Monday Wednesday and Friday.  He has had previous iron deficiency due to heavy periods which was treated with oral iron replacement.  INTERVAL HISTORY  Kiara Barrera is here for followup of her chronic ITP. She notes no issues with bleeding. She has been compliant taking her Promacta at 25 mg by mouth daily And has had no acute issues with this. Platelet counts have trended down to 20 k. No new medications. No other acute new symptoms.  We discussed treatment options including increasing Promacta to 50 mg by mouth daily. In addition we discussed the use of Rituxan versus splenectomy given that her chronic ITP appears to be getting somewhat more resistant  especially in the setting of some splenomegaly. After discussing all the pros and cons the patient is agreeable to proceed with Rituxan. Orders placed for this and chemotherapy counseling requested as well.  Patient has been counseled to work on losing weight but hasn't been able to do much towards this goal at this time.  MEDICAL HISTORY:  #1 chronic ITP #2 Iron deficiency anemia due to heavy periods #3 hypothyroidism #4 degenerative arthritis #5 anxiety disorder #6 history of pulmonary embolism in 2004 status post IVC filter placement. 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 #7 migraine headaches  SURGICAL HISTORY: Past Surgical History:  Procedure Laterality Date  . C secton  2002  . IVC FILTER PLACEMENT ([facility] HX)  2004  . Left axillary lymph node excision biopsy  2008    SOCIAL HISTORY: Social History   Social History  . Marital status: Single    Spouse name: N/A  . Number of children: N/A  . Years of education: N/A   Occupational History  . Not on file.   Social History Main Topics  . Smoking status: Never Smoker  . Smokeless tobacco: Never Used  . Alcohol use Yes     Comment: occasional: not that often  . Drug use: No  . Sexual activity: Not on file   Other Topics Concern  . Not on file   Social History Narrative  . No narrative on file  Recently moved from [place] , [place] to [place]. Nonsmoker minimal social alcohol use no drug use Currently unemployed Has a 30 year old daughter  FAMILY HISTORY: No known history of blood disorders Mother had a history of cervical cancer  maternal grandmother diabetes type 2 Maternal uncle diabetes type 2  ALLERGIES:  has No Known Allergies.  MEDICATIONS:  Current Outpatient Prescriptions  Medication Sig Dispense Refill  . Biotin 1000 MCG tablet Take 1,000 mcg by mouth daily.    . Calcium  Carbonate-Vitamin D (CALCIUM 600/VITAMIN D PO) Take by mouth.    . Cyanocobalamin (B-12) 1000 MCG SUBL Place 1,000 mcg under the tongue daily. 30 each 3  . eltrombopag (PROMACTA) 50 MG tablet Take 1 tablet (50 mg total) by mouth daily. Take on an empty stomach 1 hour before a meal or 2 hours after 30 tablet 2  . iron polysaccharides (NIFEREX) 150 MG capsule Take 1 capsule (150 mg total) by mouth 2 (two) times daily. (Patient taking differently: Take 150 mg by mouth daily. )    . levothyroxine (SYNTHROID, LEVOTHROID) 100 MCG tablet Take 1 tablet (100 mcg total) by mouth daily before breakfast. 30 tablet 1  . levothyroxine (SYNTHROID, LEVOTHROID) 100 MCG tablet Take 1 tablet (100 mcg total) by mouth daily before breakfast. 30 tablet 1  . metroNIDAZOLE (FLAGYL) 500 MG tablet Take 4 tabs po with food STAT 4 tablet 0  . Multiple Vitamin (MULTIVITAMIN) tablet Take 1 tablet by mouth daily.     No current facility-administered medications for this visit.     REVIEW OF SYSTEMS:    10 Point review of Systems was done is negative except as noted above.  PHYSICAL EXAMINATION: ECOG PERFORMANCE STATUS: 1 - Symptomatic but completely ambulatory  . Vitals:   11/29/16 0830  BP: 126/74  Pulse: 78  Resp: 18  Temp: 97.8 F (36.6 C)   Filed Weights   11/29/16 0830  Weight: (!) 312 lb 11.2 oz (141.8 kg)   .Body mass index is 57.19 kg/m.  GENERAL:alert, in no acute distress and comfortable SKIN: skin color, texture, turgor are normal, no rashes or significant lesions EYES: normal, conjunctiva are pink and non-injected, sclera clear OROPHARYNX:no exudate, no erythema and lips, buccal mucosa, and tongue normal  NECK: supple, no JVD, thyroid normal size, non-tender, without nodularity LYMPH:  no palpable lymphadenopathy in the cervical, axillary or inguinal LUNGS: clear to auscultation with normal respiratory effort HEART: regular rate & rhythm,  no murmurs and no lower extremity edema ABDOMEN:  abdomen soft, non-tender, normoactive bowel sounds  Musculoskeletal: no cyanosis of digits and no clubbing  PSYCH: alert & oriented x 3 with fluent speech NEURO: no focal motor/sensory deficits  LABORATORY DATA:  I have reviewed the data as listed  . CBC Latest Ref Rng & Units 11/29/2016 11/08/2016 10/11/2016  WBC 3.9 - 10.3 10e3/uL 4.8 5.6 6.5  Hemoglobin 11.6 - 15.9 g/dL 11.6 11.9 12.0  Hematocrit 34.8 - 46.6 % 35.5 35.1 35.3  Platelets 145 - 400 10e3/uL 20(L) 16(L) 51(L)   .Marland Kitchen CBC    Component Value Date/Time   WBC 4.8 11/29/2016 0751   RBC 4.63 11/29/2016 0751   HGB 11.6 11/29/2016 0751   HCT 35.5 11/29/2016 0751   PLT 20 (L) 11/29/2016 0751   MCV 76.7 (L) 11/29/2016 0751   MCH 25.1 11/29/2016 0751   MCHC 32.7 11/29/2016 0751   RDW 15.3 (H) 11/29/2016 0751   LYMPHSABS 1.4 11/29/2016 0751   MONOABS 0.5 11/29/2016 0751   EOSABS 0.0  11/29/2016 0751   BASOSABS 0.0 11/29/2016 0751    CMP Latest Ref Rng & Units 11/29/2016 10/11/2016 08/16/2016  Glucose 70 - 140 mg/dl 94 75 88  BUN 7.0 - 26.0 mg/dL 13.9 11.6 9.8  Creatinine 0.6 - 1.1 mg/dL 0.8 0.7 0.8  Sodium 136 - 145 mEq/L 141 139 140  Potassium 3.5 - 5.1 mEq/L 4.1 4.0 4.2  CO2 22 - 29 mEq/L 24 25 26   Calcium 8.4 - 10.4 mg/dL 9.7 9.2 9.5  Total Protein 6.4 - 8.3 g/dL 7.6 7.6 7.6  Total Bilirubin 0.20 - 1.20 mg/dL 0.33 0.35 0.47  Alkaline Phos 40 - 150 U/L 86 88 97  AST 5 - 34 U/L 18 19 17   ALT 0 - 55 U/L 17 24 20     RADIOGRAPHIC STUDIES: I have personally reviewed the radiological images as listed and agreed with the findings in the report. No results found.  ASSESSMENT & PLAN:   41 year old African-American female with   1) Chronic ITP since 2012 now with acute relapse of her ITP with very labile platelet counts No overt bleeding at this time. Patient has been on and off steroids and has been on promacta since 2016. Was apparently on Promacta 25mg  po MWF. No issues with bleeding at this time. No petechiae B12  wnl Ultrasound abdomen 06/02/2016- did show some mild splenomegaly which could be an additional factor in her thrombocytopenia. The spleen measures 12.9 x 13.9 x 6.7 cm. The calculated volume is 628 cc. Unclear etiology of splenomegaly. Liver appeared normal.  Platelets trending downwards down to 20k --no bleeding  Plan --Discussed lab findings and treatment options in detail with the patient -Will increase Promacta to 50mg  po daily -given new prescription -Received informed consent to start Rituxan ASAP weekly 4 doses. -We'll get weekly CBC while on Rituxan. -I shall see her back in one week after her first dose of Rituxan with labs for toxicity check. --absolutely avoid NSAIDS, ASA and similar products -Patient counseled on importance of weight loss and remaining physically active. -Recommend that she not get pregnant. -Will need hepatitis B testing and pregnancy urine test prior to starting Rituxan  2) Iron deficiency Anemia - likely from heavy menstrual losses. Hemoglobin has improved from 10.5 to11.6ut still with significant microcytosis. Tolerating the po iron without any significant issues . Lab Results  Component Value Date   IRON 44 10/11/2016   TIBC 411 10/11/2016   IRONPCTSAT 11 (L) 10/11/2016   (Iron and TIBC)  Lab Results  Component Value Date   FERRITIN 39 10/11/2016  Plan -Continue oral iron replacement to iron polysaccharide 150 mg by mouth twice a day  3)  Hypothyroidism - on levothyroxineAs per primary care physician  4) . Patient Active Problem List   Diagnosis Date Noted  . Encntr for gyn exam (general) (routine) w/o abn findings 10/13/2016  . Exposure to STD 10/13/2016  . Chronic ITP (idiopathic thrombocytopenia) (HCC) 05/03/2016  . Thrombocytopenia ([facility]) 04/19/2016  . Iron deficiency anemia 04/19/2016  . Hypothyroidism 04/19/2016  . Degenerative arthritis 04/19/2016  . Generalized anxiety disorder 04/19/2016   -continue f/u with PCP  5) Obesity   -discussed lifestyle modifications including need for appropriate diet and exercise.  6) H/o PE in 2004-- still has IVC filter in situ. Thought to be triggered by immobility. -not on anticoagulation at this time.  Rituxan weekly x 4 doses for ITP (to start ASAP) Weekly labs x 4 with Rituxan Chemo-counseling for Rituxan ASAP RTC with Dr Irene Limbo 1week after  1st dose of Rituxan prior to 2nd dose  All of the patients questions were answered with apparent satisfaction. The patient knows to call the clinic with any problems, questions or concerns.  I spent 20 minutes counseling the patient face to face. The total time spent in the appointment was 25 minutes and more than 50% was on counseling and direct patient cares.    Sullivan Lone MD [place] AAHIVMS [facility] Hematology/Oncology Physician [facility]  (Office):       [phone] (Work cell):  [phone] (Fax):           [phone]

## 2016-11-29 NOTE — Progress Notes (Signed)
Patient on plan of care prior to pathways. 

## 2016-11-29 NOTE — Patient Instructions (Signed)
Thank you for choosing [facility] to provide your oncology and hematology care.  To afford each patient quality time with our providers, please arrive 30 minutes before your scheduled appointment time.  If you arrive late for your appointment, you may be asked to reschedule.  We strive to give you quality time with our providers, and arriving late affects you and other patients whose appointments are after yours.  If you are a no show for multiple scheduled visits, you may be dismissed from the clinic at the providers discretion.   Again, thank you for choosing [facility], our hope is that these requests will decrease the amount of time that you wait before being seen by our physicians.  ______________________________________________________________________ Should you have questions after your visit to the [facility], please contact our office at [phone] between the hours of 8:30 and 4:30 p.m.    Voicemails left after 4:30p.m will not be returned until the following business day.   For prescription refill requests, please have your pharmacy contact us directly.  Please also try to allow 48 hours for prescription requests.   Please contact the scheduling department for questions regarding scheduling.  For scheduling of procedures such as PET scans, CT scans, MRI, Ultrasound, etc please contact central scheduling at [phone].   Resources For Cancer Patients and Caregivers:  American Cancer Society:  800-227-2345  Can help patients locate various types of support and financial assistance Cancer Care: 1-800-813-HOPE (4673) Provides financial assistance, online support groups, medication/co-pay assistance.   [place] DSS:  336-641-3447 Where to apply for food stamps, Medicaid, and utility assistance Medicare Rights Center: 800-333-4114 Helps people with Medicare understand their rights and benefits, navigate the Medicare system, and secure the  quality healthcare they deserve SCAT: 336-333-6589 [place] [employer]'s shared-ride transportation service for eligible riders who have a disability that prevents them from riding the fixed route bus.   For additional information on assistance programs please contact our social worker:   Grier Hock/Abigail Elmore:  [phone] 

## 2016-11-30 ENCOUNTER — Telehealth: Payer: Self-pay

## 2016-11-30 ENCOUNTER — Encounter: Payer: Self-pay | Admitting: *Deleted

## 2016-11-30 NOTE — Telephone Encounter (Signed)
Pharmacy called to verify that promacta dose is increased. Per Dr Irene Limbo note dated 11/29/16 the promacta is increased. Called the pharmacy with this information.

## 2016-12-02 ENCOUNTER — Other Ambulatory Visit: Payer: Medicaid Other

## 2016-12-03 ENCOUNTER — Encounter: Payer: Self-pay | Admitting: Hematology

## 2016-12-03 ENCOUNTER — Telehealth: Payer: Self-pay

## 2016-12-03 NOTE — Progress Notes (Signed)
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  Because of the type of ins she has she will not need copay assistance.  Requested she return my call.

## 2016-12-03 NOTE — Progress Notes (Signed)
Pt returned my call so we discussed the Kiara Barrera.  She would like to apply so she will bring her proof of income on 12/16/16 to see if she qualifies.

## 2016-12-03 NOTE — Telephone Encounter (Signed)
Pt was asking if she had a diagnosis of cancer. Reassured her that her dx is ITP. Reassured her she can go out in public. Discussed her approaching her HR dept at work about possible FMLA.

## 2016-12-06 ENCOUNTER — Telehealth: Payer: Self-pay | Admitting: Hematology

## 2016-12-06 NOTE — Telephone Encounter (Signed)
R/s and cancelled appt per Dr. Irene Limbo request for soonest time avilable for treatment - patient aware of changes and will pick up new schedule next visit.

## 2016-12-10 ENCOUNTER — Other Ambulatory Visit (HOSPITAL_COMMUNITY)
Admission: AD | Admit: 2016-12-10 | Discharge: 2016-12-10 | Disposition: A | Discharge status: Home / Self Care | Payer: Medicaid Other | Source: Ambulatory Visit | Attending: Hematology | Admitting: Hematology

## 2016-12-10 ENCOUNTER — Other Ambulatory Visit (HOSPITAL_BASED_OUTPATIENT_CLINIC_OR_DEPARTMENT_OTHER): Payer: Medicaid Other

## 2016-12-10 ENCOUNTER — Ambulatory Visit (HOSPITAL_BASED_OUTPATIENT_CLINIC_OR_DEPARTMENT_OTHER): Payer: Medicaid Other

## 2016-12-10 ENCOUNTER — Encounter: Payer: Self-pay | Admitting: Hematology

## 2016-12-10 VITALS — BP 130/73 | HR 84 | Temp 98.0°F | Resp 16

## 2016-12-10 DIAGNOSIS — Z5112 Encounter for antineoplastic immunotherapy: Secondary | ICD-10-CM

## 2016-12-10 DIAGNOSIS — D693 Immune thrombocytopenic purpura: Secondary | ICD-10-CM

## 2016-12-10 LAB — CBC & DIFF AND RETIC
BASO%: 0 % (ref 0.0–2.0)
Basophils Absolute: 0 10*3/uL (ref 0.0–0.1)
EOS%: 1.2 % (ref 0.0–7.0)
Eosinophils Absolute: 0.1 10*3/uL (ref 0.0–0.5)
HEMATOCRIT: 32.2 % — AB (ref 34.8–46.6)
HGB: 10.6 g/dL — ABNORMAL LOW (ref 11.6–15.9)
Immature Retic Fract: 9.6 % (ref 1.60–10.00)
LYMPH#: 1.4 10*3/uL (ref 0.9–3.3)
LYMPH%: 29.1 % (ref 14.0–49.7)
MCH: 25.1 pg (ref 25.1–34.0)
MCHC: 32.9 g/dL (ref 31.5–36.0)
MCV: 76.3 fL — ABNORMAL LOW (ref 79.5–101.0)
MONO#: 0.4 10*3/uL (ref 0.1–0.9)
MONO%: 9.1 % (ref 0.0–14.0)
NEUT#: 2.9 10*3/uL (ref 1.5–6.5)
NEUT%: 60.6 % (ref 38.4–76.8)
PLATELETS: 70 10*3/uL — AB (ref 145–400)
RBC: 4.22 10*6/uL (ref 3.70–5.45)
RDW: 14.9 % — ABNORMAL HIGH (ref 11.2–14.5)
RETIC CT ABS: 73.01 10*3/uL (ref 33.70–90.70)
Retic %: 1.73 % (ref 0.70–2.10)
WBC: 4.9 10*3/uL (ref 3.9–10.3)

## 2016-12-10 LAB — COMPREHENSIVE METABOLIC PANEL
ALT: 21 U/L (ref 0–55)
ANION GAP: 10 meq/L (ref 3–11)
AST: 24 U/L (ref 5–34)
Albumin: 3.5 g/dL (ref 3.5–5.0)
Alkaline Phosphatase: 89 U/L (ref 40–150)
BILIRUBIN TOTAL: 0.32 mg/dL (ref 0.20–1.20)
BUN: 9.2 mg/dL (ref 7.0–26.0)
CALCIUM: 8.9 mg/dL (ref 8.4–10.4)
CHLORIDE: 107 meq/L (ref 98–109)
CO2: 24 mEq/L (ref 22–29)
CREATININE: 0.7 mg/dL (ref 0.6–1.1)
EGFR: >90 mL/min/{1.73_m2} (ref >90)
Glucose: 90 mg/dl (ref 70–140)
Potassium: 3.9 mEq/L (ref 3.5–5.1)
Sodium: 141 mEq/L (ref 136–145)
Total Protein: 7.3 g/dL (ref 6.4–8.3)

## 2016-12-10 LAB — PREGNANCY, URINE: PREG TEST UR: NEGATIVE

## 2016-12-10 MED ORDER — DEXAMETHASONE SODIUM PHOSPHATE 10 MG/ML IJ SOLN
INTRAMUSCULAR | Status: AC
  Filled 2016-12-10: qty 1

## 2016-12-10 MED ORDER — ACETAMINOPHEN 325 MG PO TABS
ORAL_TABLET | ORAL | Status: AC
  Filled 2016-12-10: qty 2

## 2016-12-10 MED ORDER — ACETAMINOPHEN 325 MG PO TABS
650.0000 mg | ORAL_TABLET | Freq: Once | ORAL | Status: AC
  Administered 2016-12-10: 650 mg via ORAL

## 2016-12-10 MED ORDER — DIPHENHYDRAMINE HCL 25 MG PO CAPS
ORAL_CAPSULE | ORAL | Status: AC
  Filled 2016-12-10: qty 2

## 2016-12-10 MED ORDER — SODIUM CHLORIDE 0.9 % IV SOLN
Freq: Once | INTRAVENOUS | Status: AC
  Administered 2016-12-10: 10:00:00 via INTRAVENOUS

## 2016-12-10 MED ORDER — SODIUM CHLORIDE 0.9 % IV SOLN: 10.0000 mg | Freq: Once | INTRAVENOUS | Status: DC

## 2016-12-10 MED ORDER — DEXAMETHASONE SODIUM PHOSPHATE 10 MG/ML IJ SOLN
10.0000 mg | Freq: Once | INTRAMUSCULAR | Status: AC
  Administered 2016-12-10: 10 mg via INTRAVENOUS

## 2016-12-10 MED ORDER — DIPHENHYDRAMINE HCL 25 MG PO CAPS
50.0000 mg | ORAL_CAPSULE | Freq: Once | ORAL | Status: AC
  Administered 2016-12-10: 50 mg via ORAL

## 2016-12-10 MED ORDER — SODIUM CHLORIDE 0.9 % IV SOLN
375.0000 mg/m2 | Freq: Once | INTRAVENOUS | Status: AC
  Administered 2016-12-10: 900 mg via INTRAVENOUS
  Filled 2016-12-10: qty 50

## 2016-12-10 NOTE — Progress Notes (Signed)
Pt is approved for the $400 [place].  Gave her an expense sheet.

## 2016-12-10 NOTE — Patient Instructions (Signed)
[facility] Discharge Instructions for Patients Receiving Chemotherapy  Today you received the following chemotherapy agents Rituxan  To help prevent nausea and vomiting after your treatment, we encourage you to take your nausea medication  As prescribed.  If you develop nausea and vomiting that is not controlled by your nausea medication, call the clinic.   BELOW ARE SYMPTOMS THAT SHOULD BE REPORTED IMMEDIATELY:  *FEVER GREATER THAN 100.5 F  *CHILLS WITH OR WITHOUT FEVER  NAUSEA AND VOMITING THAT IS NOT CONTROLLED WITH YOUR NAUSEA MEDICATION  *UNUSUAL SHORTNESS OF BREATH  *UNUSUAL BRUISING OR BLEEDING  TENDERNESS IN MOUTH AND THROAT WITH OR WITHOUT PRESENCE OF ULCERS  *URINARY PROBLEMS  *BOWEL PROBLEMS  UNUSUAL RASH Items with * indicate a potential emergency and should be followed up as soon as possible.  Feel free to call the clinic you have any questions or concerns. The clinic phone number is [phone].  Please show the [facility] at check-in to the Emergency Department and triage nurse.  Rituximab injection What is this medicine? RITUXIMAB (ri TUX i mab) is a monoclonal antibody. It is used to treat certain types of cancer like non-Hodgkin lymphoma and chronic lymphocytic leukemia. It is also used to treat rheumatoid arthritis, granulomatosis with polyangiitis (or Wegener's granulomatosis), and microscopic polyangiitis. This medicine may be used for other purposes; ask your health care provider or pharmacist if you have questions. COMMON BRAND NAME(S): Rituxan What should I tell my health care provider before I take this medicine? They need to know if you have any of these conditions: -heart disease -infection (especially a virus infection such as hepatitis B, chickenpox, cold sores, or herpes) -immune system problems -irregular heartbeat -kidney disease -lung or breathing disease, like asthma -recently received or scheduled to receive a  vaccine -an unusual or allergic reaction to rituximab, mouse proteins, other medicines, foods, dyes, or preservatives -pregnant or trying to get pregnant -breast-feeding How should I use this medicine? This medicine is for infusion into a vein. It is administered in a hospital or clinic by a specially trained health care professional. A special MedGuide will be given to you by the pharmacist with each prescription and refill. Be sure to read this information carefully each time. Talk to your pediatrician regarding the use of this medicine in children. This medicine is not approved for use in children. Overdosage: If you think you have taken too much of this medicine contact a poison control center or emergency room at once. NOTE: This medicine is only for you. Do not share this medicine with others. What if I miss a dose? It is important not to miss a dose. Call your doctor or health care professional if you are unable to keep an appointment. What may interact with this medicine? -cisplatin -other medicines for arthritis like disease modifying antirheumatic drugs or tumor necrosis factor inhibitors -live virus vaccines This list may not describe all possible interactions. Give your health care provider a list of all the medicines, herbs, non-prescription drugs, or dietary supplements you use. Also tell them if you smoke, drink alcohol, or use illegal drugs. Some items may interact with your medicine. What should I watch for while using this medicine? Your condition will be monitored carefully while you are receiving this medicine. You may need blood work done while you are taking this medicine. This medicine can cause serious allergic reactions. To reduce your risk you may need to take medicine before treatment with this medicine. Take your medicine as  directed. In some patients, this medicine may cause a serious brain infection that may cause death. If you have any problems seeing, thinking,  speaking, walking, or standing, tell your doctor right away. If you cannot reach your doctor, urgently seek other source of medical care. Call your doctor or health care professional for advice if you get a fever, chills or sore throat, or other symptoms of a cold or flu. Do not treat yourself. This drug decreases your body's ability to fight infections. Try to avoid being around people who are sick. Do not become pregnant while taking this medicine or for 12 months after stopping it. Women should inform their doctor if they wish to become pregnant or think they might be pregnant. There is a potential for serious side effects to an unborn child. Talk to your health care professional or pharmacist for more information. What side effects may I notice from receiving this medicine? Side effects that you should report to your doctor or health care professional as soon as possible: -breathing problems -chest pain -dizziness or feeling faint -fast, irregular heartbeat -low blood counts - this medicine may decrease the number of white blood cells, red blood cells and platelets. You may be at increased risk for infections and bleeding. -mouth sores -redness, blistering, peeling or loosening of the skin, including inside the mouth (this can be added for any serious or exfoliative rash that could lead to hospitalization) -signs of infection - fever or chills, cough, sore throat, pain or difficulty passing urine -signs and symptoms of kidney injury like trouble passing urine or change in the amount of urine -signs and symptoms of liver injury like dark yellow or brown urine; general ill feeling or flu-like symptoms; light-colored stools; loss of appetite; nausea; right upper belly pain; unusually weak or tired; yellowing of the eyes or skin -stomach pain -vomiting Side effects that usually do not require medical attention (report to your doctor or health care professional if they continue or are  bothersome): -headache -joint pain -muscle cramps or muscle pain This list may not describe all possible side effects. Call your doctor for medical advice about side effects. You may report side effects to FDA at 1-800-FDA-1088. Where should I keep my medicine? This drug is given in a hospital or clinic and will not be stored at home. NOTE: This sheet is a summary. It may not cover all possible information. If you have questions about this medicine, talk to your doctor, pharmacist, or health care provider.  2018 Elsevier/Gold Standard (2016-02-11 15:28:09)

## 2016-12-16 ENCOUNTER — Ambulatory Visit: Payer: Medicaid Other

## 2016-12-16 ENCOUNTER — Other Ambulatory Visit: Payer: Medicaid Other

## 2016-12-17 ENCOUNTER — Other Ambulatory Visit (HOSPITAL_BASED_OUTPATIENT_CLINIC_OR_DEPARTMENT_OTHER): Payer: Medicaid Other

## 2016-12-17 ENCOUNTER — Ambulatory Visit (HOSPITAL_BASED_OUTPATIENT_CLINIC_OR_DEPARTMENT_OTHER): Payer: Medicaid Other

## 2016-12-17 VITALS — BP 118/78 | HR 96 | Temp 98.7°F | Resp 16

## 2016-12-17 DIAGNOSIS — B9689 Other specified bacterial agents as the cause of diseases classified elsewhere: Secondary | ICD-10-CM

## 2016-12-17 DIAGNOSIS — D693 Immune thrombocytopenic purpura: Secondary | ICD-10-CM | POA: Diagnosis present

## 2016-12-17 DIAGNOSIS — J019 Acute sinusitis, unspecified: Secondary | ICD-10-CM

## 2016-12-17 LAB — COMPREHENSIVE METABOLIC PANEL
ALT: 23 U/L (ref 0–55)
ANION GAP: 7 meq/L (ref 3–11)
AST: 22 U/L (ref 5–34)
Albumin: 3.6 g/dL (ref 3.5–5.0)
Alkaline Phosphatase: 104 U/L (ref 40–150)
BUN: 9.5 mg/dL (ref 7.0–26.0)
CHLORIDE: 103 meq/L (ref 98–109)
CO2: 26 meq/L (ref 22–29)
Calcium: 9.3 mg/dL (ref 8.4–10.4)
Creatinine: 0.7 mg/dL (ref 0.6–1.1)
GLUCOSE: 80 mg/dL (ref 70–140)
POTASSIUM: 3.8 meq/L (ref 3.5–5.1)
SODIUM: 136 meq/L (ref 136–145)
Total Bilirubin: 0.34 mg/dL (ref 0.20–1.20)
Total Protein: 7.7 g/dL (ref 6.4–8.3)

## 2016-12-17 LAB — CBC & DIFF AND RETIC
BASO%: 0.2 % (ref 0.0–2.0)
Basophils Absolute: 0 10*3/uL (ref 0.0–0.1)
EOS%: 3.3 % (ref 0.0–7.0)
Eosinophils Absolute: 0.3 10*3/uL (ref 0.0–0.5)
HCT: 35.7 % (ref 34.8–46.6)
HGB: 11.9 g/dL (ref 11.6–15.9)
Immature Retic Fract: 8.4 % (ref 1.60–10.00)
LYMPH%: 10.7 % — ABNORMAL LOW (ref 14.0–49.7)
MCH: 25.1 pg (ref 25.1–34.0)
MCHC: 33.3 g/dL (ref 31.5–36.0)
MCV: 75.3 fL — ABNORMAL LOW (ref 79.5–101.0)
MONO#: 1.1 10*3/uL — ABNORMAL HIGH (ref 0.1–0.9)
MONO%: 10.1 % (ref 0.0–14.0)
NEUT#: 7.9 10*3/uL — ABNORMAL HIGH (ref 1.5–6.5)
NEUT%: 75.7 % (ref 38.4–76.8)
NRBC: 0 % (ref 0–0)
PLATELETS: 78 10*3/uL — AB (ref 145–400)
RBC: 4.74 10*6/uL (ref 3.70–5.45)
RDW: 14.9 % — AB (ref 11.2–14.5)
Retic %: 2.01 % (ref 0.70–2.10)
Retic Ct Abs: 95.27 10*3/uL — ABNORMAL HIGH (ref 33.70–90.70)
WBC: 10.4 10*3/uL — AB (ref 3.9–10.3)
lymph#: 1.1 10*3/uL (ref 0.9–3.3)

## 2016-12-17 MED ORDER — AZITHROMYCIN 250 MG PO TABS: ORAL_TABLET | ORAL | 0 refills | Status: DC

## 2016-12-17 MED ORDER — DEXAMETHASONE SODIUM PHOSPHATE 10 MG/ML IJ SOLN
INTRAMUSCULAR | Status: AC
Start: 2016-12-17 — End: 2016-12-17
  Filled 2016-12-17: qty 1

## 2016-12-17 MED ORDER — ACETAMINOPHEN 325 MG PO TABS
650.0000 mg | ORAL_TABLET | Freq: Once | ORAL | Status: AC
  Administered 2016-12-17: 650 mg via ORAL

## 2016-12-17 MED ORDER — DIPHENHYDRAMINE HCL 25 MG PO CAPS
ORAL_CAPSULE | ORAL | Status: AC
  Filled 2016-12-17: qty 2

## 2016-12-17 MED ORDER — DIPHENHYDRAMINE HCL 25 MG PO CAPS
50.0000 mg | ORAL_CAPSULE | Freq: Once | ORAL | Status: AC
  Administered 2016-12-17: 50 mg via ORAL

## 2016-12-17 MED ORDER — SODIUM CHLORIDE 0.9 % IV SOLN
375.0000 mg/m2 | Freq: Once | INTRAVENOUS | Status: AC
  Administered 2016-12-17: 900 mg via INTRAVENOUS
  Filled 2016-12-17: qty 50

## 2016-12-17 MED ORDER — ACETAMINOPHEN 325 MG PO TABS
ORAL_TABLET | ORAL | Status: AC
  Filled 2016-12-17: qty 2

## 2016-12-17 MED ORDER — SODIUM CHLORIDE 0.9 % IV SOLN
Freq: Once | INTRAVENOUS | Status: AC
  Administered 2016-12-17: 14:00:00 via INTRAVENOUS

## 2016-12-17 MED ORDER — SODIUM CHLORIDE 0.9 % IV SOLN
10.0000 mg | Freq: Once | INTRAVENOUS | Status: DC
  Filled 2016-12-17: qty 1

## 2016-12-17 MED ORDER — DEXAMETHASONE SODIUM PHOSPHATE 10 MG/ML IJ SOLN
10.0000 mg | Freq: Once | INTRAMUSCULAR | Status: AC
  Administered 2016-12-17: 10 mg via INTRAVENOUS

## 2016-12-17 NOTE — Patient Instructions (Addendum)
[facility] Discharge Instructions for Patients Receiving Chemotherapy  Today you received the following chemotherapy agents: Rituxan   To help prevent nausea and vomiting after your treatment, we encourage you to take your nausea medication as directed.    If you develop nausea and vomiting that is not controlled by your nausea medication, call the clinic.   BELOW ARE SYMPTOMS THAT SHOULD BE REPORTED IMMEDIATELY:  *FEVER GREATER THAN 100.5 F  *CHILLS WITH OR WITHOUT FEVER  NAUSEA AND VOMITING THAT IS NOT CONTROLLED WITH YOUR NAUSEA MEDICATION  *UNUSUAL SHORTNESS OF BREATH  *UNUSUAL BRUISING OR BLEEDING  TENDERNESS IN MOUTH AND THROAT WITH OR WITHOUT PRESENCE OF ULCERS  *URINARY PROBLEMS  *BOWEL PROBLEMS  UNUSUAL RASH Items with * indicate a potential emergency and should be followed up as soon as possible.  Feel free to call the clinic you have any questions or concerns. The clinic phone number is [phone].  Please show the CHEMO ALERT CARD at check-in to the Emergency Department and triage nurse.   

## 2016-12-23 ENCOUNTER — Ambulatory Visit (HOSPITAL_BASED_OUTPATIENT_CLINIC_OR_DEPARTMENT_OTHER): Payer: Medicaid Other | Admitting: Hematology

## 2016-12-23 ENCOUNTER — Encounter: Payer: Self-pay | Admitting: Hematology

## 2016-12-23 ENCOUNTER — Other Ambulatory Visit (HOSPITAL_BASED_OUTPATIENT_CLINIC_OR_DEPARTMENT_OTHER): Payer: Medicaid Other

## 2016-12-23 ENCOUNTER — Ambulatory Visit: Payer: Medicaid Other

## 2016-12-23 ENCOUNTER — Telehealth: Payer: Self-pay | Admitting: Hematology

## 2016-12-23 VITALS — BP 100/72 | HR 90 | Temp 98.7°F | Resp 20 | Ht 62.0 in | Wt 321.5 lb

## 2016-12-23 DIAGNOSIS — D509 Iron deficiency anemia, unspecified: Secondary | ICD-10-CM

## 2016-12-23 DIAGNOSIS — D693 Immune thrombocytopenic purpura: Secondary | ICD-10-CM

## 2016-12-23 DIAGNOSIS — J309 Allergic rhinitis, unspecified: Secondary | ICD-10-CM

## 2016-12-23 LAB — COMPREHENSIVE METABOLIC PANEL
ALT: 19 U/L (ref 0–55)
AST: 17 U/L (ref 5–34)
Albumin: 3.3 g/dL — ABNORMAL LOW (ref 3.5–5.0)
Alkaline Phosphatase: 99 U/L (ref 40–150)
Anion Gap: 8 mEq/L (ref 3–11)
BUN: 13.9 mg/dL (ref 7.0–26.0)
CO2: 25 meq/L (ref 22–29)
Calcium: 9.4 mg/dL (ref 8.4–10.4)
Chloride: 106 mEq/L (ref 98–109)
Creatinine: 0.7 mg/dL (ref 0.6–1.1)
GLUCOSE: 123 mg/dL (ref 70–140)
POTASSIUM: 3.8 meq/L (ref 3.5–5.1)
SODIUM: 139 meq/L (ref 136–145)
Total Bilirubin: 0.39 mg/dL (ref 0.20–1.20)
Total Protein: 7.3 g/dL (ref 6.4–8.3)

## 2016-12-23 LAB — CBC & DIFF AND RETIC
BASO%: 0.2 % (ref 0.0–2.0)
BASOS ABS: 0 10*3/uL (ref 0.0–0.1)
EOS ABS: 0.2 10*3/uL (ref 0.0–0.5)
EOS%: 1.6 % (ref 0.0–7.0)
HCT: 35.4 % (ref 34.8–46.6)
HEMOGLOBIN: 11.8 g/dL (ref 11.6–15.9)
IMMATURE RETIC FRACT: 13.9 % — AB (ref 1.60–10.00)
LYMPH#: 1.3 10*3/uL (ref 0.9–3.3)
LYMPH%: 11 % — ABNORMAL LOW (ref 14.0–49.7)
MCH: 25.1 pg (ref 25.1–34.0)
MCHC: 33.3 g/dL (ref 31.5–36.0)
MCV: 75.3 fL — AB (ref 79.5–101.0)
MONO#: 0.8 10*3/uL (ref 0.1–0.9)
MONO%: 6.6 % (ref 0.0–14.0)
NEUT%: 80.6 % — ABNORMAL HIGH (ref 38.4–76.8)
NEUTROS ABS: 9.4 10*3/uL — AB (ref 1.5–6.5)
NRBC: 0 % (ref 0–0)
Platelets: 18 10*3/uL — ABNORMAL LOW (ref 145–400)
RBC: 4.7 10*6/uL (ref 3.70–5.45)
RDW: 15.1 % — AB (ref 11.2–14.5)
RETIC %: 1.72 % (ref 0.70–2.10)
Retic Ct Abs: 80.84 10*3/uL (ref 33.70–90.70)
WBC: 11.6 10*3/uL — AB (ref 3.9–10.3)

## 2016-12-23 MED ORDER — PREDNISONE 20 MG PO TABS: 60.0000 mg | ORAL_TABLET | Freq: Every day | ORAL | 0 refills | Status: DC

## 2016-12-23 NOTE — Telephone Encounter (Signed)
Scheduled appt per 6/7 los - Gave patient AVS and calender per 6/7 

## 2016-12-23 NOTE — Patient Instructions (Signed)
Thank you for choosing [facility] to provide your oncology and hematology care.  To afford each patient quality time with our providers, please arrive 30 minutes before your scheduled appointment time.  If you arrive late for your appointment, you may be asked to reschedule.  We strive to give you quality time with our providers, and arriving late affects you and other patients whose appointments are after yours.   If you are a no show for multiple scheduled visits, you may be dismissed from the clinic at the providers discretion.    Again, thank you for choosing [facility], our hope is that these requests will decrease the amount of time that you wait before being seen by our physicians.  ______________________________________________________________________  Should you have questions after your visit to the [facility], please contact our office at [phone] between the hours of 8:30 and 4:30 p.m.    Voicemails left after 4:30p.m will not be returned until the following business day.    For prescription refill requests, please have your pharmacy contact us directly.  Please also try to allow 48 hours for prescription requests.    Please contact the scheduling department for questions regarding scheduling.  For scheduling of procedures such as PET scans, CT scans, MRI, Ultrasound, etc please contact central scheduling at [phone].    Resources For Cancer Patients and Caregivers:   Oncolink.org:  A wonderful resource for patients and healthcare providers for information regarding your disease, ways to tract your treatment, what to expect, etc.     American Cancer Society:  800-227-2345  Can help patients locate various types of support and financial assistance  Cancer Care: 1-800-813-HOPE (4673) Provides financial assistance, online support groups, medication/co-pay assistance.    [place] DSS:  336-641-3447 Where to apply for food  stamps, Medicaid, and utility assistance  Medicare Rights Center: 800-333-4114 Helps people with Medicare understand their rights and benefits, navigate the Medicare system, and secure the quality healthcare they deserve  [employer]: 336-333-6589 [place] [employer]'s shared-ride transportation service for eligible riders who have a disability that prevents them from riding the fixed route bus.    For additional information on assistance programs please contact our social worker:   Grier Hock/Abigail Elmore:  [phone]            

## 2016-12-24 ENCOUNTER — Ambulatory Visit (HOSPITAL_BASED_OUTPATIENT_CLINIC_OR_DEPARTMENT_OTHER): Payer: Medicaid Other

## 2016-12-24 VITALS — BP 136/71 | HR 93 | Temp 99.1°F | Resp 16

## 2016-12-24 DIAGNOSIS — D693 Immune thrombocytopenic purpura: Secondary | ICD-10-CM

## 2016-12-24 MED ORDER — DEXAMETHASONE SODIUM PHOSPHATE 10 MG/ML IJ SOLN
INTRAMUSCULAR | Status: AC
  Filled 2016-12-24: qty 1

## 2016-12-24 MED ORDER — DIPHENHYDRAMINE HCL 25 MG PO CAPS
50.0000 mg | ORAL_CAPSULE | Freq: Once | ORAL | Status: AC
  Administered 2016-12-24: 50 mg via ORAL

## 2016-12-24 MED ORDER — DIPHENHYDRAMINE HCL 25 MG PO CAPS
ORAL_CAPSULE | ORAL | Status: AC
  Filled 2016-12-24: qty 1

## 2016-12-24 MED ORDER — DIPHENHYDRAMINE HCL 25 MG PO CAPS
ORAL_CAPSULE | ORAL | Status: AC
  Filled 2016-12-24: qty 2

## 2016-12-24 MED ORDER — ACETAMINOPHEN 325 MG PO TABS
ORAL_TABLET | ORAL | Status: AC
  Filled 2016-12-24: qty 2

## 2016-12-24 MED ORDER — SODIUM CHLORIDE 0.9 % IV SOLN: 10.0000 mg | Freq: Once | INTRAVENOUS | Status: DC

## 2016-12-24 MED ORDER — DEXAMETHASONE SODIUM PHOSPHATE 10 MG/ML IJ SOLN
10.0000 mg | Freq: Once | INTRAMUSCULAR | Status: AC
  Administered 2016-12-24: 10 mg via INTRAVENOUS

## 2016-12-24 MED ORDER — SODIUM CHLORIDE 0.9 % IV SOLN
375.0000 mg/m2 | Freq: Once | INTRAVENOUS | Status: AC
  Administered 2016-12-24: 900 mg via INTRAVENOUS
  Filled 2016-12-24: qty 40

## 2016-12-24 MED ORDER — ACETAMINOPHEN 325 MG PO TABS
650.0000 mg | ORAL_TABLET | Freq: Once | ORAL | Status: AC
  Administered 2016-12-24: 650 mg via ORAL

## 2016-12-24 MED ORDER — SODIUM CHLORIDE 0.9 % IV SOLN
Freq: Once | INTRAVENOUS | Status: AC
  Administered 2016-12-24: 14:00:00 via INTRAVENOUS

## 2016-12-24 NOTE — Patient Instructions (Signed)
[facility] Discharge Instructions for Patients Receiving Chemotherapy  Today you received the following chemotherapy agents: Rituxan   To help prevent nausea and vomiting after your treatment, we encourage you to take your nausea medication as directed.    If you develop nausea and vomiting that is not controlled by your nausea medication, call the clinic.   BELOW ARE SYMPTOMS THAT SHOULD BE REPORTED IMMEDIATELY:  *FEVER GREATER THAN 100.5 F  *CHILLS WITH OR WITHOUT FEVER  NAUSEA AND VOMITING THAT IS NOT CONTROLLED WITH YOUR NAUSEA MEDICATION  *UNUSUAL SHORTNESS OF BREATH  *UNUSUAL BRUISING OR BLEEDING  TENDERNESS IN MOUTH AND THROAT WITH OR WITHOUT PRESENCE OF ULCERS  *URINARY PROBLEMS  *BOWEL PROBLEMS  UNUSUAL RASH Items with * indicate a potential emergency and should be followed up as soon as possible.  Feel free to call the clinic you have any questions or concerns. The clinic phone number is [phone].  Please show the CHEMO ALERT CARD at check-in to the Emergency Department and triage nurse.   

## 2016-12-30 ENCOUNTER — Ambulatory Visit: Payer: Medicaid Other

## 2016-12-30 ENCOUNTER — Other Ambulatory Visit: Payer: Medicaid Other

## 2016-12-31 ENCOUNTER — Other Ambulatory Visit (HOSPITAL_BASED_OUTPATIENT_CLINIC_OR_DEPARTMENT_OTHER): Payer: Medicaid Other

## 2016-12-31 ENCOUNTER — Ambulatory Visit (HOSPITAL_BASED_OUTPATIENT_CLINIC_OR_DEPARTMENT_OTHER): Payer: Medicaid Other

## 2016-12-31 VITALS — BP 143/68 | HR 89 | Temp 98.2°F | Resp 20

## 2016-12-31 DIAGNOSIS — Z5112 Encounter for antineoplastic immunotherapy: Secondary | ICD-10-CM | POA: Diagnosis present

## 2016-12-31 DIAGNOSIS — D693 Immune thrombocytopenic purpura: Secondary | ICD-10-CM

## 2016-12-31 LAB — CBC & DIFF AND RETIC
BASO%: 0.1 % (ref 0.0–2.0)
Basophils Absolute: 0 10*3/uL (ref 0.0–0.1)
EOS ABS: 0.1 10*3/uL (ref 0.0–0.5)
EOS%: 1.7 % (ref 0.0–7.0)
HEMATOCRIT: 36.3 % (ref 34.8–46.6)
HEMOGLOBIN: 12.1 g/dL (ref 11.6–15.9)
IMMATURE RETIC FRACT: 6.8 % (ref 1.60–10.00)
LYMPH%: 14.3 % (ref 14.0–49.7)
MCH: 25.3 pg (ref 25.1–34.0)
MCHC: 33.3 g/dL (ref 31.5–36.0)
MCV: 75.8 fL — ABNORMAL LOW (ref 79.5–101.0)
MONO#: 0.4 10*3/uL (ref 0.1–0.9)
MONO%: 5.7 % (ref 0.0–14.0)
NEUT%: 78.2 % — AB (ref 38.4–76.8)
NEUTROS ABS: 5.9 10*3/uL (ref 1.5–6.5)
PLATELETS: 81 10*3/uL — AB (ref 145–400)
RBC: 4.79 10*6/uL (ref 3.70–5.45)
RDW: 15.3 % — ABNORMAL HIGH (ref 11.2–14.5)
RETIC CT ABS: 65.14 10*3/uL (ref 33.70–90.70)
Retic %: 1.36 % (ref 0.70–2.10)
WBC: 7.6 10*3/uL (ref 3.9–10.3)
lymph#: 1.1 10*3/uL (ref 0.9–3.3)
nRBC: 0 % (ref 0–0)

## 2016-12-31 LAB — COMPREHENSIVE METABOLIC PANEL
ALBUMIN: 3.4 g/dL — AB (ref 3.5–5.0)
ALK PHOS: 94 U/L (ref 40–150)
ALT: 19 U/L (ref 0–55)
ANION GAP: 11 meq/L (ref 3–11)
AST: 18 U/L (ref 5–34)
BILIRUBIN TOTAL: 0.48 mg/dL (ref 0.20–1.20)
BUN: 11.5 mg/dL (ref 7.0–26.0)
CALCIUM: 9.2 mg/dL (ref 8.4–10.4)
CO2: 24 meq/L (ref 22–29)
CREATININE: 0.7 mg/dL (ref 0.6–1.1)
Chloride: 105 mEq/L (ref 98–109)
Glucose: 103 mg/dl (ref 70–140)
Potassium: 3.7 mEq/L (ref 3.5–5.1)
Sodium: 140 mEq/L (ref 136–145)
TOTAL PROTEIN: 7.5 g/dL (ref 6.4–8.3)

## 2016-12-31 MED ORDER — SODIUM CHLORIDE 0.9 % IV SOLN
Freq: Once | INTRAVENOUS | Status: AC
Start: 2016-12-31 — End: 2016-12-31
  Administered 2016-12-31: 13:00:00 via INTRAVENOUS

## 2016-12-31 MED ORDER — DIPHENHYDRAMINE HCL 25 MG PO CAPS
ORAL_CAPSULE | ORAL | Status: AC
  Filled 2016-12-31: qty 2

## 2016-12-31 MED ORDER — ACETAMINOPHEN 325 MG PO TABS
ORAL_TABLET | ORAL | Status: AC
  Filled 2016-12-31: qty 2

## 2016-12-31 MED ORDER — SODIUM CHLORIDE 0.9 % IV SOLN
375.0000 mg/m2 | Freq: Once | INTRAVENOUS | Status: AC
  Administered 2016-12-31: 900 mg via INTRAVENOUS
  Filled 2016-12-31: qty 40

## 2016-12-31 MED ORDER — DEXAMETHASONE SODIUM PHOSPHATE 10 MG/ML IJ SOLN
10.0000 mg | Freq: Once | INTRAMUSCULAR | Status: AC
  Administered 2016-12-31: 10 mg via INTRAVENOUS

## 2016-12-31 MED ORDER — DEXAMETHASONE SODIUM PHOSPHATE 10 MG/ML IJ SOLN
INTRAMUSCULAR | Status: AC
  Filled 2016-12-31: qty 1

## 2016-12-31 MED ORDER — DIPHENHYDRAMINE HCL 25 MG PO CAPS
50.0000 mg | ORAL_CAPSULE | Freq: Once | ORAL | Status: AC
  Administered 2016-12-31: 50 mg via ORAL

## 2016-12-31 MED ORDER — ACETAMINOPHEN 325 MG PO TABS
650.0000 mg | ORAL_TABLET | Freq: Once | ORAL | Status: AC
  Administered 2016-12-31: 650 mg via ORAL

## 2016-12-31 NOTE — Patient Instructions (Signed)
[facility] Discharge Instructions for Patients Receiving Chemotherapy  Today you received the following chemotherapy agents: Rituxan   To help prevent nausea and vomiting after your treatment, we encourage you to take your nausea medication as directed.    If you develop nausea and vomiting that is not controlled by your nausea medication, call the clinic.   BELOW ARE SYMPTOMS THAT SHOULD BE REPORTED IMMEDIATELY:  *FEVER GREATER THAN 100.5 F  *CHILLS WITH OR WITHOUT FEVER  NAUSEA AND VOMITING THAT IS NOT CONTROLLED WITH YOUR NAUSEA MEDICATION  *UNUSUAL SHORTNESS OF BREATH  *UNUSUAL BRUISING OR BLEEDING  TENDERNESS IN MOUTH AND THROAT WITH OR WITHOUT PRESENCE OF ULCERS  *URINARY PROBLEMS  *BOWEL PROBLEMS  UNUSUAL RASH Items with * indicate a potential emergency and should be followed up as soon as possible.  Feel free to call the clinic you have any questions or concerns. The clinic phone number is [phone].  Please show the CHEMO ALERT CARD at check-in to the Emergency Department and triage nurse.   

## 2017-01-06 ENCOUNTER — Telehealth: Payer: Self-pay | Admitting: Hematology

## 2017-01-06 ENCOUNTER — Other Ambulatory Visit: Payer: Medicaid Other

## 2017-01-06 ENCOUNTER — Encounter: Payer: Self-pay | Admitting: Hematology

## 2017-01-06 ENCOUNTER — Ambulatory Visit (HOSPITAL_BASED_OUTPATIENT_CLINIC_OR_DEPARTMENT_OTHER): Payer: Medicaid Other | Admitting: Hematology

## 2017-01-06 ENCOUNTER — Ambulatory Visit: Payer: Medicaid Other

## 2017-01-06 ENCOUNTER — Other Ambulatory Visit (HOSPITAL_BASED_OUTPATIENT_CLINIC_OR_DEPARTMENT_OTHER): Payer: Medicaid Other

## 2017-01-06 VITALS — BP 138/87 | HR 89 | Temp 98.6°F | Resp 20 | Ht 62.0 in | Wt 320.7 lb

## 2017-01-06 DIAGNOSIS — N920 Excessive and frequent menstruation with regular cycle: Secondary | ICD-10-CM | POA: Diagnosis not present

## 2017-01-06 DIAGNOSIS — D693 Immune thrombocytopenic purpura: Secondary | ICD-10-CM

## 2017-01-06 DIAGNOSIS — D500 Iron deficiency anemia secondary to blood loss (chronic): Secondary | ICD-10-CM | POA: Diagnosis not present

## 2017-01-06 DIAGNOSIS — D509 Iron deficiency anemia, unspecified: Secondary | ICD-10-CM

## 2017-01-06 LAB — CBC & DIFF AND RETIC
BASO%: 0.1 % (ref 0.0–2.0)
BASOS ABS: 0 10*3/uL (ref 0.0–0.1)
EOS%: 1.2 % (ref 0.0–7.0)
Eosinophils Absolute: 0.1 10*3/uL (ref 0.0–0.5)
HEMATOCRIT: 34.9 % (ref 34.8–46.6)
HEMOGLOBIN: 11.8 g/dL (ref 11.6–15.9)
Immature Retic Fract: 9.6 % (ref 1.60–10.00)
LYMPH%: 12.5 % — AB (ref 14.0–49.7)
MCH: 25.4 pg (ref 25.1–34.0)
MCHC: 33.8 g/dL (ref 31.5–36.0)
MCV: 75.2 fL — AB (ref 79.5–101.0)
MONO#: 0.7 10*3/uL (ref 0.1–0.9)
MONO%: 8.4 % (ref 0.0–14.0)
NEUT#: 6.4 10*3/uL (ref 1.5–6.5)
NEUT%: 77.8 % — ABNORMAL HIGH (ref 38.4–76.8)
NRBC: 0 % (ref 0–0)
PLATELETS: 134 10*3/uL — AB (ref 145–400)
RBC: 4.64 10*6/uL (ref 3.70–5.45)
RDW: 15.3 % — AB (ref 11.2–14.5)
Retic %: 1.61 % (ref 0.70–2.10)
Retic Ct Abs: 74.7 10*3/uL (ref 33.70–90.70)
WBC: 8.3 10*3/uL (ref 3.9–10.3)
lymph#: 1 10*3/uL (ref 0.9–3.3)

## 2017-01-06 LAB — COMPREHENSIVE METABOLIC PANEL
ALK PHOS: 92 U/L (ref 40–150)
ALT: 23 U/L (ref 0–55)
AST: 21 U/L (ref 5–34)
Albumin: 3.3 g/dL — ABNORMAL LOW (ref 3.5–5.0)
Anion Gap: 9 mEq/L (ref 3–11)
BUN: 8 mg/dL (ref 7.0–26.0)
CO2: 25 meq/L (ref 22–29)
Calcium: 9.1 mg/dL (ref 8.4–10.4)
Chloride: 104 mEq/L (ref 98–109)
Creatinine: 0.7 mg/dL (ref 0.6–1.1)
GLUCOSE: 107 mg/dL (ref 70–140)
POTASSIUM: 4 meq/L (ref 3.5–5.1)
SODIUM: 139 meq/L (ref 136–145)
Total Bilirubin: 0.47 mg/dL (ref 0.20–1.20)
Total Protein: 7.1 g/dL (ref 6.4–8.3)

## 2017-01-06 LAB — IRON AND TIBC
%SAT: 19 % — ABNORMAL LOW (ref 21–57)
Iron: 78 ug/dL (ref 41–142)
TIBC: 405 ug/dL (ref 236–444)
UIBC: 327 ug/dL (ref 120–384)

## 2017-01-06 LAB — FERRITIN: Ferritin: 60 ng/ml (ref 9–269)

## 2017-01-06 NOTE — Telephone Encounter (Signed)
Scheduled appt per 6/21 los - Gave patient AVS and calender per los. Lab and 4 week f/u

## 2017-01-06 NOTE — Patient Instructions (Signed)
Thank you for choosing [facility] to provide your oncology and hematology care.  To afford each patient quality time with our providers, please arrive 30 minutes before your scheduled appointment time.  If you arrive late for your appointment, you may be asked to reschedule.  We strive to give you quality time with our providers, and arriving late affects you and other patients whose appointments are after yours.   If you are a no show for multiple scheduled visits, you may be dismissed from the clinic at the providers discretion.    Again, thank you for choosing [facility], our hope is that these requests will decrease the amount of time that you wait before being seen by our physicians.  ______________________________________________________________________  Should you have questions after your visit to the [facility], please contact our office at [phone] between the hours of 8:30 and 4:30 p.m.    Voicemails left after 4:30p.m will not be returned until the following business day.    For prescription refill requests, please have your pharmacy contact us directly.  Please also try to allow 48 hours for prescription requests.    Please contact the scheduling department for questions regarding scheduling.  For scheduling of procedures such as PET scans, CT scans, MRI, Ultrasound, etc please contact central scheduling at [phone].    Resources For Cancer Patients and Caregivers:   Oncolink.org:  A wonderful resource for patients and healthcare providers for information regarding your disease, ways to tract your treatment, what to expect, etc.     American Cancer Society:  800-227-2345  Can help patients locate various types of support and financial assistance  Cancer Care: 1-800-813-HOPE (4673) Provides financial assistance, online support groups, medication/co-pay assistance.    [place] DSS:  336-641-3447 Where to apply for food  stamps, Medicaid, and utility assistance  Medicare Rights Center: 800-333-4114 Helps people with Medicare understand their rights and benefits, navigate the Medicare system, and secure the quality healthcare they deserve  SCAT: 336-333-6589 [place] [employer]'s shared-ride transportation service for eligible riders who have a disability that prevents them from riding the fixed route bus.    For additional information on assistance programs please contact our social worker:   Grier Hock/Abigail Elmore:  [phone]            

## 2017-01-22 ENCOUNTER — Other Ambulatory Visit: Payer: Self-pay | Admitting: Hematology

## 2017-01-25 ENCOUNTER — Other Ambulatory Visit: Payer: Self-pay

## 2017-01-25 ENCOUNTER — Telehealth: Payer: Self-pay

## 2017-01-25 MED ORDER — LEVOTHYROXINE SODIUM 100 MCG PO TABS: 100.0000 ug | ORAL_TABLET | Freq: Every day | ORAL | 1 refills | Status: DC

## 2017-01-25 NOTE — Telephone Encounter (Signed)
Left message for pt letting her know that levothyroxine was reordered and sent to [facility] at [address]

## 2017-01-26 ENCOUNTER — Telehealth: Payer: Self-pay

## 2017-01-26 NOTE — Telephone Encounter (Signed)
Called pt to respond to questions. Pt experiencing intermittent shakiness, sluggishness, and 2 day cough. Wondering if it is potentially related to her illness or treatment. Advised pt to take temperature. Sluggishness and shakiness is concerning. Recommended visiting the ED in the event that pt could be experiencing side effect of low blood counts. No c/o pain or bleeding at this time. Pt verbalized that she is not extremely concerned at this time, and will go to urgent care or the ED is symptoms worsen or persist. Additionally, pt asked about disability r/t her diagnosis and potential for financial assistance.  Will discuss will appropriate parties tomorrow and call pt back.

## 2017-01-27 ENCOUNTER — Telehealth: Payer: Self-pay

## 2017-01-27 NOTE — Telephone Encounter (Signed)
Verified with Dr. Julien Nordmann that the recommended course of action is to visit PCP or urgent care d/t symptoms experienced at home: chills, low-grade fever, cough, and fatigue. Pt stated that she would schedule an appt with her PCP. Communicated to pt that she can bring utility bills to [place] for assistance. Lenise said that pt has already met with her and would be willing to work with her. Message sent to [place] regarding potential for disability. May have to wait until Dr. Irene Limbo returns for recommendation and approval. Pt verbalized understanding.

## 2017-02-03 ENCOUNTER — Encounter: Payer: Self-pay | Admitting: *Deleted

## 2017-02-04 ENCOUNTER — Other Ambulatory Visit (HOSPITAL_BASED_OUTPATIENT_CLINIC_OR_DEPARTMENT_OTHER): Payer: Medicaid Other

## 2017-02-04 ENCOUNTER — Ambulatory Visit (HOSPITAL_BASED_OUTPATIENT_CLINIC_OR_DEPARTMENT_OTHER): Payer: Medicaid Other | Admitting: Hematology

## 2017-02-04 ENCOUNTER — Encounter: Payer: Self-pay | Admitting: Hematology

## 2017-02-04 ENCOUNTER — Telehealth: Payer: Self-pay | Admitting: Hematology

## 2017-02-04 VITALS — BP 127/63 | HR 81 | Temp 97.7°F | Resp 18 | Ht 62.0 in | Wt 320.2 lb

## 2017-02-04 DIAGNOSIS — D693 Immune thrombocytopenic purpura: Secondary | ICD-10-CM

## 2017-02-04 LAB — COMPREHENSIVE METABOLIC PANEL
ALBUMIN: 3.5 g/dL (ref 3.5–5.0)
ALK PHOS: 92 U/L (ref 40–150)
ALT: 24 U/L (ref 0–55)
AST: 24 U/L (ref 5–34)
Anion Gap: 8 mEq/L (ref 3–11)
BILIRUBIN TOTAL: 0.31 mg/dL (ref 0.20–1.20)
BUN: 8.7 mg/dL (ref 7.0–26.0)
CALCIUM: 9.4 mg/dL (ref 8.4–10.4)
CO2: 26 mEq/L (ref 22–29)
Chloride: 104 mEq/L (ref 98–109)
Creatinine: 0.7 mg/dL (ref 0.6–1.1)
EGFR: >90 mL/min/{1.73_m2} (ref >90)
Glucose: 96 mg/dl (ref 70–140)
POTASSIUM: 4.2 meq/L (ref 3.5–5.1)
Sodium: 139 mEq/L (ref 136–145)
TOTAL PROTEIN: 7.3 g/dL (ref 6.4–8.3)

## 2017-02-04 LAB — CBC & DIFF AND RETIC
BASO%: 0.2 % (ref 0.0–2.0)
BASOS ABS: 0 10*3/uL (ref 0.0–0.1)
EOS%: 2.5 % (ref 0.0–7.0)
Eosinophils Absolute: 0.1 10*3/uL (ref 0.0–0.5)
HEMATOCRIT: 36.4 % (ref 34.8–46.6)
HEMOGLOBIN: 12.2 g/dL (ref 11.6–15.9)
IMMATURE RETIC FRACT: 7.5 % (ref 1.60–10.00)
LYMPH%: 20 % (ref 14.0–49.7)
MCH: 24.9 pg — AB (ref 25.1–34.0)
MCHC: 33.5 g/dL (ref 31.5–36.0)
MCV: 74.3 fL — AB (ref 79.5–101.0)
MONO#: 0.6 10*3/uL (ref 0.1–0.9)
MONO%: 12.3 % (ref 0.0–14.0)
NEUT#: 3.1 10*3/uL (ref 1.5–6.5)
NEUT%: 65 % (ref 38.4–76.8)
Platelets: 367 10*3/uL (ref 145–400)
RBC: 4.9 10*6/uL (ref 3.70–5.45)
RDW: 15 % — ABNORMAL HIGH (ref 11.2–14.5)
RETIC %: 1.31 % (ref 0.70–2.10)
Retic Ct Abs: 64.19 10*3/uL (ref 33.70–90.70)
WBC: 4.8 10*3/uL (ref 3.9–10.3)
lymph#: 1 10*3/uL (ref 0.9–3.3)
nRBC: 0 % (ref 0–0)

## 2017-02-04 LAB — FERRITIN: FERRITIN: 42 ng/mL (ref 9–269)

## 2017-02-04 MED ORDER — ELTROMBOPAG OLAMINE 25 MG PO TABS: 25.0000 mg | ORAL_TABLET | Freq: Every day | ORAL | 2 refills | Status: DC

## 2017-02-04 NOTE — Patient Instructions (Addendum)
Patient instructions -Please decrease Promacta to 50 mg every other day till you get your new prescription for 25mg  po daily. Prescriptions has been sent to you specialty pharmacy.   Thank you for choosing [facility] to provide your oncology and hematology care.  To afford each patient quality time with our providers, please arrive 30 minutes before your scheduled appointment time.  If you arrive late for your appointment, you may be asked to reschedule.  We strive to give you quality time with our providers, and arriving late affects you and other patients whose appointments are after yours.   If you are a no show for multiple scheduled visits, you may be dismissed from the clinic at the providers discretion.    Again, thank you for choosing [facility], our hope is that these requests will decrease the amount of time that you wait before being seen by our physicians.  ______________________________________________________________________  Should you have questions after your visit to the [facility], please contact our office at [phone] between the hours of 8:30 and 4:30 p.m.    Voicemails left after 4:30p.m will not be returned until the following business day.    For prescription refill requests, please have your pharmacy contact us directly.  Please also try to allow 48 hours for prescription requests.    Please contact the scheduling department for questions regarding scheduling.  For scheduling of procedures such as PET scans, CT scans, MRI, Ultrasound, etc please contact central scheduling at [phone].    Resources For Cancer Patients and Caregivers:   Oncolink.org:  A wonderful resource for patients and healthcare providers for information regarding your disease, ways to tract your treatment, what to expect, etc.     [place]:  610 233 7850  Can help patients locate various types of support and financial  assistance  Cancer Care: 1-800-813-HOPE 316-694-1116) Provides financial assistance, online support groups, medication/co-pay assistance.    [place]:  [phone] Where to apply for food stamps, Medicaid, and utility assistance  Medicare Rights Center: 567-584-8151 Helps people with Medicare understand their rights and benefits, navigate the Medicare system, and secure the quality healthcare they deserve  SCAT: [employer]'s shared-ride transportation service for eligible riders who have a disability that prevents them from riding the fixed route bus.    For additional information on assistance programs please contact our social worker:   Sharren Bridge:  [phone]

## 2017-02-04 NOTE — Telephone Encounter (Signed)
Scheduled appt per 7/20 los - Gave patient AVS and calender per los.  

## 2017-02-06 NOTE — Progress Notes (Signed)
HEMATOLOGY/ONCOLOGY CLINIC NOTE  Date of Service: .01/06/2017  PCP: [facility] COMPLAINTS/PURPOSE OF CONSULTATION:  Chronic ITP now with relapse  HISTORY OF PRESENTING ILLNESS:   Kiara Barrera is a wonderful 41 y.o. female who has been referred to [place] by [facility] for evaluation and management of Chronic ITP.  Patient was been managed for her chronic ITP by Dr Yvone Neu at [facility] in [place], [place] and recently moved to [place] and wants to establish care here.  As per review of outside records the patient was diagnosed with ITP since 2012 in [place] and has been treated with prednisone on off since then. She apparently had significant weight gain with prednisone and had chronically low platelets despite the prednisone. She was then started on Promacta 50 mg by mouth daily in June 2016 and had a very good response with platelet counts of more than 500k. Her Promacta was discontinued in July 2016. She had relapse of her ITP in September 2016 with platelet count was down to 49k any significant bleeding or bruising. He has been on variable doses of Promacta since then and reports that she is currently taking 25 mg on Monday Wednesday and Friday.  He has had previous iron deficiency due to heavy periods which was treated with oral iron replacement.  INTERVAL HISTORY  Kiara Barrera is here for followup of her chronic ITP and after uneventfully completing her 4 doses of Rituxan. Her URI symptoms of resolved. No fevers no chills no night sweats. Platelet counts today have improved to 134k from 81k from 18k in the last 2 weeks. Patient's continues to be on Promacta 50mg  po daily which she'll be continued at this time . We discussed that the peak effect of Rituxan can take 4-8 weeks to manifest .  MEDICAL HISTORY:  #1 chronic ITP #2 Iron deficiency anemia due to heavy  periods #3 hypothyroidism #4 degenerative arthritis #5 anxiety disorder #6 history of pulmonary embolism in 2004 status post IVC filter placement. 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 #7 migraine headaches  SURGICAL HISTORY: Past Surgical History:  Procedure Laterality Date  . C secton  2002  . IVC FILTER PLACEMENT ([facility])  2004  . Left axillary lymph node excision biopsy  2008    SOCIAL HISTORY: Social History   Social History  . Marital status: Single    Spouse name: N/A  . Number of children: N/A  . Years of education: N/A   Occupational History  . Not on file.   Social History Main Topics  . Smoking status: Never Smoker  . Smokeless tobacco: Never Used  . Alcohol use Yes     Comment: occasional: not that often  . Drug use: No  . Sexual activity: Not on file   Other Topics Concern  . Not on file   Social History Narrative  . No narrative on file  Recently moved from [place] , [place] to [place]. Nonsmoker minimal social alcohol use no drug use Currently unemployed Has a 33 year old daughter  FAMILY HISTORY: No known history of blood disorders Mother had a history of cervical cancer  maternal grandmother diabetes type 2 Maternal uncle diabetes type 2  ALLERGIES:  has No Known Allergies.  MEDICATIONS:  Current Outpatient Prescriptions  Medication Sig  Dispense Refill  . Biotin 1000 MCG tablet Take 1,000 mcg by mouth daily.    . Calcium Carbonate-Vitamin D (CALCIUM 600/VITAMIN D PO) Take by mouth.    . Cyanocobalamin (B-12) 1000 MCG SUBL Place 1,000 mcg under the tongue daily. 30 each 3  . iron polysaccharides (NIFEREX) 150 MG capsule Take 1 capsule (150 mg total) by mouth 2 (two) times daily. (Patient taking differently: Take 150 mg by mouth daily. )    . Multiple Vitamin (MULTIVITAMIN) tablet Take 1 tablet by mouth daily.    Marland Kitchen eltrombopag  (PROMACTA) 25 MG tablet Take 1 tablet (25 mg total) by mouth daily. Take on an empty stomach 1 hour before a meal or 2 hours after 30 tablet 2  . levothyroxine (SYNTHROID, LEVOTHROID) 100 MCG tablet Take 1 tablet (100 mcg total) by mouth daily before breakfast. 30 tablet 1   No current facility-administered medications for this visit.     REVIEW OF SYSTEMS:    10 Point review of Systems was done is negative except as noted above.  PHYSICAL EXAMINATION: ECOG PERFORMANCE STATUS: 1 - Symptomatic but completely ambulatory  . Vitals:   01/06/17 0954  BP: 138/87  Pulse: 89  Resp: 20  Temp: 98.6 F (37 C)   Filed Weights   01/06/17 0954  Weight: (!) 320 lb 11.2 oz (145.5 kg)   .Body mass index is 58.66 kg/m.  GENERAL:alert, in no acute distress and comfortable SKIN: skin color, texture, turgor are normal, no rashes or significant lesions EYES: normal, conjunctiva are pink and non-injected, sclera clear OROPHARYNX:no exudate, no erythema and lips, buccal mucosa, and tongue normal  NECK: supple, no JVD, thyroid normal size, non-tender, without nodularity LYMPH:  no palpable lymphadenopathy in the cervical, axillary or inguinal LUNGS: clear to auscultation with normal respiratory effort HEART: regular rate & rhythm,  no murmurs and no lower extremity edema ABDOMEN: abdomen soft, non-tender, normoactive bowel sounds  Musculoskeletal: no cyanosis of digits and no clubbing  PSYCH: alert & oriented x 3 with fluent speech NEURO: no focal motor/sensory deficits  LABORATORY DATA:  I have reviewed the data as listed . CBC Latest Ref Rng & Units 01/06/2017 12/31/2016  WBC 3.9 - 10.3 10e3/uL 8.3 7.6  Hemoglobin 11.6 - 15.9 g/dL 11.8 12.1  Hematocrit 34.8 - 46.6 % 34.9 36.3  Platelets 145 - 400 10e3/uL 134(L) 81(L)   . CMP Latest Ref Rng & Units 01/06/2017 12/31/2016  Glucose 70 - 140 mg/dl 107 103  BUN 7.0 - 26.0 mg/dL 8.0 11.5  Creatinine 0.6 - 1.1 mg/dL 0.7 0.7  Sodium 136 - 145  mEq/L 139 140  Potassium 3.5 - 5.1 mEq/L 4.0 3.7  CO2 22 - 29 mEq/L 25 24  Calcium 8.4 - 10.4 mg/dL 9.1 9.2  Total Protein 6.4 - 8.3 g/dL 7.1 7.5  Total Bilirubin 0.20 - 1.20 mg/dL 0.47 0.48  Alkaline Phos 40 - 150 U/L 92 94  AST 5 - 34 U/L 21 18  ALT 0 - 55 U/L 23 19      RADIOGRAPHIC STUDIES: I have personally reviewed the radiological images as listed and agreed with the findings in the report. No results found.  ASSESSMENT & PLAN:   41 year old African-American female with   1) Chronic ITP since 2012 now with acute relapse of her ITP with very labile platelet counts No overt bleeding at this time. Patient has been on and off steroids and has been on promacta since 2016. Was apparently on Promacta 25mg  po MWF.  No issues with bleeding at this time. No petechiae B12 wnl Ultrasound abdomen 06/02/2016- did show some mild splenomegaly which could be an additional factor in her thrombocytopenia. The spleen measures 12.9 x 13.9 x 6.7 cm. The calculated volume is 628 cc. Unclear etiology of splenomegaly. Liver appeared normal.  Platelets had improved from 20k to 78k with a drop back down to 18k in the setting of URI. Has since improved to 81k and then today to 134k.  Plan -Notes no prohibitive toxicity from Rituxan and has completed her 4 weekly doses. -Progressive improvement in platelet count suggest good response to Rituxan. We discussed that the peak effect is typically seen in 4-8 weeks and that she might continue to respond. -Continue Promacta to 50mg  po daily  -We discussed that we might back off on the Promacta based on her platelet counts on follow-up. -absolutely avoid NSAIDS, ASA and similar products -Patient counseled on importance of weight loss and remaining physically active.  2) Iron deficiency Anemia - likely from heavy menstrual losses. Hemoglobin has improved from 10.5 to11.8 . Tolerating the po iron without any significant issues . Lab Results  Component  Value Date   IRON 78 01/06/2017   TIBC 405 01/06/2017   IRONPCTSAT 19 (L) 01/06/2017   (Iron and TIBC)  Lab Results  Component Value Date   FERRITIN 42 02/04/2017  Plan -Continue oral iron replacement to iron polysaccharide 150 mg by mouth twice a day till ferritin close to 100  3)  Hypothyroidism - on levothyroxine As per primary care physician  4) . Patient Active Problem List   Diagnosis Date Noted  . Encntr for gyn exam (general) (routine) w/o abn findings 10/13/2016  . Exposure to STD 10/13/2016  . Chronic ITP (idiopathic thrombocytopenia) (HCC) 05/03/2016  . Thrombocytopenia ([facility] [facility]) 04/19/2016  . Iron deficiency anemia 04/19/2016  . Hypothyroidism 04/19/2016  . Degenerative arthritis 04/19/2016  . Generalized anxiety disorder 04/19/2016   -continue f/u with PCP  5) Obesity  -discussed lifestyle modifications including need for appropriate diet and exercise.  6) H/o PE in 2004-- still has IVC filter in situ. Thought to be triggered by immobility. -not on anticoagulation at this time.  RTC with Dr Irene Limbo in 4 weeks with labs    All of the patients questions were answered with apparent satisfaction. The patient knows to call the clinic with any problems, questions or concerns.  I spent 20 minutes counseling the patient face to face. The total time spent in the appointment was 20 minutes and more than 50% was on counseling and direct patient cares.    Sullivan Lone MD [place] AAHIVMS [facility] Hematology/Oncology Physician [facility]  (Office):       [phone] (Work cell):  [phone] (Fax):           [phone]

## 2017-02-06 NOTE — Progress Notes (Signed)
HEMATOLOGY/ONCOLOGY CLINIC NOTE  Date of Service: .12/23/2016  PCP: [facility] COMPLAINTS/PURPOSE OF CONSULTATION:  Chronic ITP now with relapse  HISTORY OF PRESENTING ILLNESS:   Kiara Barrera is a wonderful 41 y.o. female who has been referred to [place] by [facility] for evaluation and management of Chronic ITP.  Patient was been managed for her chronic ITP by Dr Yvone Neu at [facility] in [place], [place] and recently moved to [place] and wants to establish care here.  As per review of outside records the patient was diagnosed with ITP since 2012 in [place] and has been treated with prednisone on off since then. She apparently had significant weight gain with prednisone and had chronically low platelets despite the prednisone. She was then started on Promacta 50 mg by mouth daily in June 2016 and had a very good response with platelet counts of more than 500k. Her Promacta was discontinued in July 2016. She had relapse of her ITP in September 2016 with platelet count was down to 49k any significant bleeding or bruising. He has been on variable doses of Promacta since then and reports that she is currently taking 25 mg on Monday Wednesday and Friday.  He has had previous iron deficiency due to heavy periods which was treated with oral iron replacement.  INTERVAL HISTORY  Kiara Barrera is here for followup of her chronic ITP and toxicity check for Rituxan. No issues with tolerating the Rituxan with no toxicities. No allergic reactions. No fevers no chills no night sweats. Platelet counts today are down to 18k. Patient's on Promacta 48m po daily.  Likely drop in the setting of viral URI  MEDICAL HISTORY:  #1 chronic ITP #2 Iron deficiency anemia due to heavy periods #3 hypothyroidism #4 degenerative arthritis #5 anxiety disorder #6 history of pulmonary embolism in  2004 status post IVC filter placement. 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 #7 migraine headaches  SURGICAL HISTORY: Past Surgical History:  Procedure Laterality Date  . C secton  2002  . [place]  [place]  . Left axillary lymph node excision biopsy  2008    SOCIAL HISTORY: Social History   Social History  . Marital status: Single    Spouse name: N/A  . Number of children: N/A  . Years of education: N/A   Occupational History  . Not on file.   Social History Main Topics  . Smoking status: Never Smoker  . Smokeless tobacco: Never Used  . Alcohol use Yes     Comment: occasional: not that often  . Drug use: No  . Sexual activity: Not on file   Other Topics Concern  . Not on file   Social History Narrative  . No narrative on file  [place] Nonsmoker minimal social alcohol use no drug use Currently unemployed Has a 155year old daughter  FAMILY HISTORY: No known history of blood disorders Mother had a history of cervical cancer  maternal grandmother diabetes type 2 Maternal uncle diabetes type 2  ALLERGIES:  has No Known Allergies.  MEDICATIONS:  Current Outpatient Prescriptions  Medication Sig Dispense Refill  . Biotin 1000 MCG tablet Take 1,000 mcg by mouth daily.    . Calcium Carbonate-Vitamin D (CALCIUM 600/VITAMIN  D PO) Take by mouth.    . Cyanocobalamin (B-12) 1000 MCG SUBL Place 1,000 mcg under the tongue daily. 30 each 3  . iron polysaccharides (NIFEREX) 150 MG capsule Take 1 capsule (150 mg total) by mouth 2 (two) times daily. (Patient taking differently: Take 150 mg by mouth daily. )    . Multiple Vitamin (MULTIVITAMIN) tablet Take 1 tablet by mouth daily.    Marland Kitchen eltrombopag (PROMACTA) 25 MG tablet Take 1 tablet (25 mg total) by mouth daily. Take on an empty stomach 1 hour before a meal  or 2 hours after 30 tablet 2  . levothyroxine (SYNTHROID, LEVOTHROID) 100 MCG tablet Take 1 tablet (100 mcg total) by mouth daily before breakfast. 30 tablet 1   No current facility-administered medications for this visit.     REVIEW OF SYSTEMS:    10 Point review of Systems was done is negative except as noted above.  PHYSICAL EXAMINATION: ECOG PERFORMANCE STATUS: 1 - Symptomatic but completely ambulatory  . Vitals:   12/23/16 1207  BP: 100/72  Pulse: 90  Resp: 20  Temp: 98.7 F (37.1 C)   Filed Weights   12/23/16 1207  Weight: (!) 321 lb 8 oz (145.8 kg)   .Body mass index is 58.8 kg/m.  GENERAL:alert, in no acute distress and comfortable SKIN: skin color, texture, turgor are normal, no rashes or significant lesions EYES: normal, conjunctiva are pink and non-injected, sclera clear OROPHARYNX:no exudate, no erythema and lips, buccal mucosa, and tongue normal  NECK: supple, no JVD, thyroid normal size, non-tender, without nodularity LYMPH:  no palpable lymphadenopathy in the cervical, axillary or inguinal LUNGS: clear to auscultation with normal respiratory effort HEART: regular rate & rhythm,  no murmurs and no lower extremity edema ABDOMEN: abdomen soft, non-tender, normoactive bowel sounds  Musculoskeletal: no cyanosis of digits and no clubbing  PSYCH: alert & oriented x 3 with fluent speech NEURO: no focal motor/sensory deficits  LABORATORY DATA:  I have reviewed the data as listed  Component     Latest Ref Rng & Units 12/17/2016 12/23/2016  WBC     3.9 - 10.3 10e3/uL 10.4 (H) 11.6 (H)  NEUT#     1.5 - 6.5 10e3/uL 7.9 (H) 9.4 (H)  Hemoglobin     11.6 - 15.9 g/dL 11.9 11.8  HCT     34.8 - 46.6 % 35.7 35.4  Platelets     145 - 400 10e3/uL 78 (L) 18 (L)  MCV     79.5 - 101.0 fL 75.3 (L) 75.3 (L)  MCH     25.1 - 34.0 pg 25.1 25.1  MCHC     31.5 - 36.0 g/dL 33.3 33.3  RBC     3.70 - 5.45 10e6/uL 4.74 4.70  RDW     11.2 - 14.5 % 14.9 (H) 15.1 (H)  lymph#      0.9 - 3.3 10e3/uL 1.1 1.3  MONO#     0.1 - 0.9 10e3/uL 1.1 (H) 0.8  Eosinophils Absolute     0.0 - 0.5 10e3/uL 0.3 0.2  Basophils Absolute     0.0 - 0.1 10e3/uL 0.0 0.0  NEUT%     38.4 - 76.8 % 75.7 80.6 (H)  LYMPH%     14.0 - 49.7 % 10.7 (L) 11.0 (L)  MONO%     0.0 - 14.0 % 10.1 6.6  EOS%     0.0 - 7.0 % 3.3 1.6  BASO%     0.0 - 2.0 % 0.2 0.2  nRBC     0 - 0 % 0 0  Retic %     0.70 - 2.10 % 2.01 1.72  Retic Ct Abs     33.70 - 90.70 10e3/uL 95.27 (H) 80.84  Immature Retic Fract     1.60 - 10.00 % 8.40 13.90 (H)  Sodium     136 - 145 mEq/L 136 139  Potassium     3.5 - 5.1 mEq/L 3.8 3.8  Chloride     98 - 109 mEq/L 103 106  CO2     22 - 29 mEq/L 26 25  Glucose     70 - 140 mg/dl 80 123  BUN     7.0 - 26.0 mg/dL 9.5 13.9  Creatinine     0.6 - 1.1 mg/dL 0.7 0.7  Total Bilirubin     0.20 - 1.20 mg/dL 0.34 0.39  Alkaline Phosphatase     40 - 150 U/L 104 99  AST     5 - 34 U/L 22 17  ALT     0 - 55 U/L 23 19  Total Protein     6.4 - 8.3 g/dL 7.7 7.3  Albumin     3.5 - 5.0 g/dL 3.6 3.3 (L)  Calcium     8.4 - 10.4 mg/dL 9.3 9.4  Anion gap     3 - 11 mEq/L 7 8  EGFR     >90 ml/min/1.73 m2 >90 >90    RADIOGRAPHIC STUDIES: I have personally reviewed the radiological images as listed and agreed with the findings in the report. No results found.  ASSESSMENT & PLAN:   41 year old African-American female with   1) Chronic ITP since 2012 now with acute relapse of her ITP with very labile platelet counts No overt bleeding at this time. Patient has been on and off steroids and has been on promacta since 2016. Was apparently on Promacta 69m po MWF. No issues with bleeding at this time. No petechiae B12 wnl Ultrasound abdomen 06/02/2016- did show some mild splenomegaly which could be an additional factor in her thrombocytopenia. The spleen measures 12.9 x 13.9 x 6.7 cm. The calculated volume is 628 cc. Unclear etiology of splenomegaly. Liver appeared  normal.  Platelets had improved from 20k to 78k with a drop back down to 18k in the setting of URI  Plan -Notes no prohibitive toxicity from Rituxan. -Continue Promacta to 566mpo daily -given new prescription -Continue Rituxan to complete 4 weekly doses. -Anticipate improvement in platelets with clearance of URI and with treatment with Rituxan. -Will Monitor platelets closely weekly. Recommended to avoid any contact activities and watch for issues with bleeding. --absolutely avoid NSAIDS, ASA and similar products -Patient counseled on importance of weight loss and remaining physically active. -Recommend that she not get pregnant at this time.  2) Iron deficiency Anemia - likely from heavy menstrual losses. Hemoglobin has improved from 10.5 to11.8 . Tolerating the po iron without any significant issues . Lab Results  Component Value Date   IRON 78 01/06/2017   TIBC 405 01/06/2017   IRONPCTSAT 19 (L) 01/06/2017   (Iron and TIBC)  Lab Results  Component Value Date   FERRITIN 42 02/04/2017  Plan -Continue oral iron replacement to iron polysaccharide 150 mg by mouth twice a day  3)  Hypothyroidism - on levothyroxine As per primary care physician  4) . Patient Active Problem List   Diagnosis Date Noted  . Encntr for gyn exam (general) (routine) w/o abn findings  10/13/2016  . Exposure to STD 10/13/2016  . Chronic ITP (idiopathic thrombocytopenia) (HCC) 05/03/2016  . Thrombocytopenia ([place]) 04/19/2016  . Iron deficiency anemia 04/19/2016  . Hypothyroidism 04/19/2016  . Degenerative arthritis 04/19/2016  . Generalized anxiety disorder 04/19/2016   -continue f/u with PCP  5) Obesity  -discussed lifestyle modifications including need for appropriate diet and exercise.  6) H/o PE in 2004-- still has IVC filter in situ. Thought to be triggered by immobility. -not on anticoagulation at this time.  Continue Rituxan as per schedule RTC with Dr Irene Limbo in 2 weeks with  labs Continue Weekly labs with Rituxan   All of the patients questions were answered with apparent satisfaction. The patient knows to call the clinic with any problems, questions or concerns.  I spent 20 minutes counseling the patient face to face. The total time spent in the appointment was 25 minutes and more than 50% was on counseling and direct patient cares.    Sullivan Lone MD [place] AAHIVMS [facility] Hematology/Oncology Physician [facility]  (Office):       [phone] (Work cell):  [phone] (Fax):           [phone]

## 2017-02-06 NOTE — Progress Notes (Signed)
HEMATOLOGY/ONCOLOGY CLINIC NOTE  Date of Service: .02/04/2017  PCP: [facility] COMPLAINTS/PURPOSE OF CONSULTATION:  Chronic ITP now with relapse  HISTORY OF PRESENTING ILLNESS:   Kiara Barrera is a wonderful 41 y.o. female who has been referred to [place] by [facility] for evaluation and management of Chronic ITP.  Patient was been managed for her chronic ITP by Dr Yvone Neu at [facility] in [place], [place] and recently moved to [place] and wants to establish care here.  As per review of outside records the patient was diagnosed with ITP since 2012 in [place] and has been treated with prednisone on off since then. She apparently had significant weight gain with prednisone and had chronically low platelets despite the prednisone. She was then started on Promacta 50 mg by mouth daily in June 2016 and had a very good response with platelet counts of more than 500k. Her Promacta was discontinued in July 2016. She had relapse of her ITP in September 2016 with platelet count was down to 49k any significant bleeding or bruising. He has been on variable doses of Promacta since then and reports that she is currently taking 25 mg on Monday Wednesday and Friday.  He has had previous iron deficiency due to heavy periods which was treated with oral iron replacement.  INTERVAL HISTORY  Kiara Barrera is here for followup of her chronic ITP about a month after completion of her Rituxan treatment. She notes that she got a new job and is quite happy about this. No bleeding. No fevers no chills. Her platelet counts have improved impressively to 367k have completely normalized. We discuss cutting down her Promacta dose back to 25 mg by mouth daily and trying to wean her off this if her platelets remained stable. She is agreeable with this plan. No other acute new symptoms.  MEDICAL HISTORY:    #1 chronic ITP #2 Iron deficiency anemia due to heavy periods #3 hypothyroidism #4 degenerative arthritis #5 anxiety disorder #6 history of pulmonary embolism in 2004 status post IVC filter placement. 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 #7 migraine headaches  SURGICAL HISTORY: Past Surgical History:  Procedure Laterality Date  . C secton  2002  . IVC FILTER PLACEMENT ([facility] HX)  2004  . Left axillary lymph node excision biopsy  2008    SOCIAL HISTORY: Social History   Social History  . Marital status: Single    Spouse name: N/A  . Number of children: N/A  . Years of education: N/A   Occupational History  . Not on file.   Social History Main Topics  . Smoking status: Never Smoker  . Smokeless tobacco: Never Used  . Alcohol use Yes     Comment: occasional: not that often  . Drug use: No  . Sexual activity: Not on file   Other Topics Concern  . Not on file   Social History Narrative  . No narrative on file  Recently moved from [place] , [place] to [place]. Nonsmoker minimal social alcohol use no drug use Currently unemployed Has a 34 year old daughter  FAMILY HISTORY: No known history of blood disorders Mother had a history of cervical cancer  maternal grandmother diabetes type 2 Maternal uncle diabetes type 2  ALLERGIES:  has No Known Allergies.  MEDICATIONS:  Current Outpatient Prescriptions  Medication Sig Dispense Refill  . Biotin 1000 MCG tablet Take 1,000 mcg by mouth daily.    . Calcium Carbonate-Vitamin D (CALCIUM 600/VITAMIN D PO) Take by mouth.    . Cyanocobalamin (B-12) 1000 MCG SUBL Place 1,000 mcg under the tongue daily. 30 each 3  . eltrombopag (PROMACTA) 25 MG tablet Take 1 tablet (25 mg total) by mouth daily. Take on an empty stomach 1 hour before a meal or 2 hours after 30 tablet 2  . iron polysaccharides (NIFEREX) 150 MG  capsule Take 1 capsule (150 mg total) by mouth 2 (two) times daily. (Patient taking differently: Take 150 mg by mouth daily. )    . levothyroxine (SYNTHROID, LEVOTHROID) 100 MCG tablet Take 1 tablet (100 mcg total) by mouth daily before breakfast. 30 tablet 1  . Multiple Vitamin (MULTIVITAMIN) tablet Take 1 tablet by mouth daily.     No current facility-administered medications for this visit.     REVIEW OF SYSTEMS:    10 Point review of Systems was done is negative except as noted above.  PHYSICAL EXAMINATION: ECOG PERFORMANCE STATUS: 1 - Symptomatic but completely ambulatory  . Vitals:   02/04/17 0947  BP: 127/63  Pulse: 81  Resp: 18  Temp: 97.7 F (36.5 C)   Filed Weights   02/04/17 0947  Weight: (!) 320 lb 3.2 oz (145.2 kg)   .Body mass index is 58.57 kg/m.  GENERAL:alert, in no acute distress and comfortable SKIN: skin color, texture, turgor are normal, no rashes or significant lesions EYES: normal, conjunctiva are pink and non-injected, sclera clear OROPHARYNX:no exudate, no erythema and lips, buccal mucosa, and tongue normal  NECK: supple, no JVD, thyroid normal size, non-tender, without nodularity LYMPH:  no palpable lymphadenopathy in the cervical, axillary or inguinal LUNGS: clear to auscultation with normal respiratory effort HEART: regular rate & rhythm,  no murmurs and no lower extremity edema ABDOMEN: abdomen soft, non-tender, normoactive bowel sounds  Musculoskeletal: no cyanosis of digits and no clubbing  PSYCH: alert & oriented x 3 with fluent speech NEURO: no focal motor/sensory deficits  LABORATORY DATA:  I have reviewed the data as listed  . CBC Latest Ref Rng & Units 02/04/2017 01/06/2017 12/31/2016  WBC 3.9 - 10.3 10e3/uL 4.8 8.3 7.6  Hemoglobin 11.6 - 15.9 g/dL 12.2 11.8 12.1  Hematocrit 34.8 - 46.6 % 36.4 34.9 36.3  Platelets 145 - 400 10e3/uL 367 134(L) 81(L)    . CMP Latest Ref Rng & Units 02/04/2017 01/06/2017 12/31/2016  Glucose 70 - 140  mg/dl 96 107 103  BUN 7.0 - 26.0 mg/dL 8.7 8.0 11.5  Creatinine 0.6 - 1.1 mg/dL 0.7 0.7 0.7  Sodium 136 - 145 mEq/L 139 139 140  Potassium 3.5 - 5.1 mEq/L 4.2 4.0 3.7  CO2 22 - 29 mEq/L 26 25 24   Calcium 8.4 - 10.4 mg/dL 9.4 9.1 9.2  Total Protein 6.4 - 8.3 g/dL 7.3 7.1 7.5  Total Bilirubin 0.20 - 1.20 mg/dL 0.31 0.47 0.48  Alkaline Phos 40 - 150 U/L 92 92 94  AST 5 - 34 U/L 24 21 18   ALT 0 - 55 U/L 24 23 19      RADIOGRAPHIC STUDIES: I have personally reviewed the radiological images as listed and agreed with the findings in the report. No results found.  ASSESSMENT & PLAN:   41 year old African-American female with   1) Chronic ITP since 2012 now with acute relapse of her ITP with very labile platelet counts  No overt bleeding at this time. Patient has been on and off steroids and has been on promacta since 2016. Was apparently on Promacta 25mg  po MWF. No issues with bleeding at this time. No petechiae B12 wnl Ultrasound abdomen 06/02/2016- did show some mild splenomegaly which could be an additional factor in her thrombocytopenia. The spleen measures 12.9 x 13.9 x 6.7 cm. The calculated volume is 628 cc. Unclear etiology of splenomegaly. Liver appeared normal.  Patient's platelet counts have normalized after treatment with Rituxan weekly 4 doses with platelets improving from 18-20k now up to 365k suggesting possible remission from her chronic ITP.  Plan -Patient is understandably glad with the results of Rituxan treatment. -We discussed cutting down her Promacta 25 mg by mouth daily and a new prescription has been sent towards this change in dose. While awaiting a new prescription the patient will change to taking the Promacta 50 mg every other day. -Repeat CBC in 2 weeks. If platelet count is still more than 200k might consider weaning down the Promacta further. -absolutely avoid NSAIDS, ASA and similar products -Patient counseled on importance of weight loss and remaining  physically active.  2) Iron deficiency Anemia - likely from heavy menstrual losses. Hemoglobin has improved from 10.5 to 12.2 Tolerating the po iron without any significant issues . Lab Results  Component Value Date   IRON 78 01/06/2017   TIBC 405 01/06/2017   IRONPCTSAT 19 (L) 01/06/2017   (Iron and TIBC)  Lab Results  Component Value Date   FERRITIN 42 02/04/2017  Plan -Continue oral iron replacement to iron polysaccharide 150 mg by mouth twice a day till ferritin close to 100  3)  Hypothyroidism - on levothyroxine As per primary care physician  4) . Patient Active Problem List   Diagnosis Date Noted  . Encntr for gyn exam (general) (routine) w/o abn findings 10/13/2016  . Exposure to STD 10/13/2016  . Chronic ITP (idiopathic thrombocytopenia) (HCC) 05/03/2016  . Thrombocytopenia ([facility]) 04/19/2016  . Iron deficiency anemia 04/19/2016  . Hypothyroidism 04/19/2016  . Degenerative arthritis 04/19/2016  . Generalized anxiety disorder 04/19/2016   -continue f/u with PCP  5) Obesity  -discussed lifestyle modifications including need for appropriate diet and exercise.  6) H/o PE in 2004-- still has IVC filter in situ. Thought to be triggered by immobility. -not on anticoagulation at this time.  Labs in 2 weeks RTC with Dr Irene Limbo in 4 weeks with labs    All of the patients questions were answered with apparent satisfaction. The patient knows to call the clinic with any problems, questions or concerns.  I spent 20 minutes counseling the patient face to face. The total time spent in the appointment was 25 minutes and more than 50% was on counseling and direct patient cares.    Sullivan Lone MD [place] AAHIVMS [facility] Hematology/Oncology Physician [facility]  (Office):       [phone] (Work cell):  [phone] (Fax):           [phone]

## 2017-02-07 ENCOUNTER — Telehealth: Payer: Self-pay

## 2017-02-07 NOTE — Telephone Encounter (Signed)
Called pt to let her know per Dr. Irene Limbo she does not qualify for employment disability at this time based on her current diagnosis of ITP. Pt verbalized understanding. She let me know that she has acquired a job and is very glad to have it.

## 2017-02-09 ENCOUNTER — Telehealth: Payer: Self-pay | Admitting: Pharmacist

## 2017-02-09 DIAGNOSIS — D693 Immune thrombocytopenic purpura: Secondary | ICD-10-CM

## 2017-02-09 MED ORDER — ELTROMBOPAG OLAMINE 25 MG PO TABS: 25.0000 mg | ORAL_TABLET | Freq: Every day | ORAL | 2 refills | Status: DC

## 2017-02-09 NOTE — Telephone Encounter (Signed)
Oral Chemotherapy Pharmacist Encounter   I spoke with patient for overview of oral chemotherapy medication: Promacta for the treatment of ITP, planned duration disease progression or unacceptable toxicity.   Pt is doing well. The prescriptions have been sent to the [facility] for benefit analysis and approval.  Copayment $3, this will be ordered by the pharmacy today for shipment to the patient's home tomorrow (02/10/17). Expected delivery to patient on 7/27.  Counseled patient on administration, dosing, side effects, safe handling, and monitoring. Patient will take Promacta 25mg  tablets, 1 tablet by mouth once daily on an empty stomach, 1 hour before or 2 hours after a meal.  Side effects include but not limited to: N/V, diarrhea, flu-like symptoms, myalgias, rash, and fever.    Reviewed with patient importance of keeping a medication schedule and plan for any missed doses.  Mrs. Maywood voiced understanding and appreciation.   All questions answered.  Patient knows to call the office with questions or concerns. Oral Oncology Clinic will continue to follow.  Thank you,  Johny Drilling, PharmD, BCPS, BCOP 02/09/2017  10:20 AM Oral Oncology Clinic [phone]

## 2017-02-17 ENCOUNTER — Other Ambulatory Visit: Payer: Self-pay | Admitting: Hematology

## 2017-02-18 ENCOUNTER — Other Ambulatory Visit: Payer: Medicaid Other

## 2017-02-18 MED FILL — PROMACTA 25 MG TABLET: 25 | 30 days supply | Qty: 30 | Fill #0

## 2017-03-04 ENCOUNTER — Encounter: Payer: Self-pay | Admitting: Hematology

## 2017-03-04 ENCOUNTER — Other Ambulatory Visit (HOSPITAL_BASED_OUTPATIENT_CLINIC_OR_DEPARTMENT_OTHER): Payer: Medicaid Other

## 2017-03-04 ENCOUNTER — Telehealth: Payer: Self-pay

## 2017-03-04 ENCOUNTER — Ambulatory Visit (HOSPITAL_BASED_OUTPATIENT_CLINIC_OR_DEPARTMENT_OTHER): Payer: Medicaid Other | Admitting: Hematology

## 2017-03-04 VITALS — BP 138/87 | HR 82 | Temp 98.0°F | Resp 20 | Ht 62.0 in | Wt 321.6 lb

## 2017-03-04 DIAGNOSIS — D693 Immune thrombocytopenic purpura: Secondary | ICD-10-CM

## 2017-03-04 DIAGNOSIS — E039 Hypothyroidism, unspecified: Secondary | ICD-10-CM | POA: Diagnosis not present

## 2017-03-04 DIAGNOSIS — D509 Iron deficiency anemia, unspecified: Secondary | ICD-10-CM | POA: Diagnosis not present

## 2017-03-04 LAB — COMPREHENSIVE METABOLIC PANEL
ALBUMIN: 3.3 g/dL — AB (ref 3.5–5.0)
ALK PHOS: 88 U/L (ref 40–150)
ALT: 18 U/L (ref 0–55)
ANION GAP: 7 meq/L (ref 3–11)
AST: 21 U/L (ref 5–34)
BUN: 11.8 mg/dL (ref 7.0–26.0)
CALCIUM: 8.9 mg/dL (ref 8.4–10.4)
CHLORIDE: 108 meq/L (ref 98–109)
CO2: 25 mEq/L (ref 22–29)
Creatinine: 0.8 mg/dL (ref 0.6–1.1)
EGFR: >90 mL/min/{1.73_m2} (ref >90)
Glucose: 97 mg/dl (ref 70–140)
POTASSIUM: 4 meq/L (ref 3.5–5.1)
Sodium: 140 mEq/L (ref 136–145)
Total Bilirubin: 0.3 mg/dL (ref 0.20–1.20)
Total Protein: 6.8 g/dL (ref 6.4–8.3)

## 2017-03-04 LAB — CBC & DIFF AND RETIC
BASO%: 0 % (ref 0.0–2.0)
Basophils Absolute: 0 10*3/uL (ref 0.0–0.1)
EOS%: 2.2 % (ref 0.0–7.0)
Eosinophils Absolute: 0.1 10*3/uL (ref 0.0–0.5)
HCT: 35.4 % (ref 34.8–46.6)
HGB: 11.8 g/dL (ref 11.6–15.9)
IMMATURE RETIC FRACT: 7.2 % (ref 1.60–10.00)
LYMPH%: 19.3 % (ref 14.0–49.7)
MCH: 24.9 pg — ABNORMAL LOW (ref 25.1–34.0)
MCHC: 33.3 g/dL (ref 31.5–36.0)
MCV: 74.7 fL — ABNORMAL LOW (ref 79.5–101.0)
MONO#: 0.6 10*3/uL (ref 0.1–0.9)
MONO%: 12 % (ref 0.0–14.0)
NEUT#: 3.3 10*3/uL (ref 1.5–6.5)
NEUT%: 66.5 % (ref 38.4–76.8)
PLATELETS: 94 10*3/uL — AB (ref 145–400)
RBC: 4.74 10*6/uL (ref 3.70–5.45)
RDW: 15.4 % — AB (ref 11.2–14.5)
RETIC %: 0.96 % (ref 0.70–2.10)
RETIC CT ABS: 45.5 10*3/uL (ref 33.70–90.70)
WBC: 4.9 10*3/uL (ref 3.9–10.3)
lymph#: 1 10*3/uL (ref 0.9–3.3)

## 2017-03-04 NOTE — Telephone Encounter (Signed)
appts made and avs printed per 03/04/17 los  Kandie Keiper

## 2017-03-04 NOTE — Patient Instructions (Signed)
Thank you for choosing [facility] to provide your oncology and hematology care.  To afford each patient quality time with our providers, please arrive 30 minutes before your scheduled appointment time.  If you arrive late for your appointment, you may be asked to reschedule.  We strive to give you quality time with our providers, and arriving late affects you and other patients whose appointments are after yours.  If you are a no show for multiple scheduled visits, you may be dismissed from the clinic at the providers discretion.   Again, thank you for choosing [facility], our hope is that these requests will decrease the amount of time that you wait before being seen by our physicians.  ______________________________________________________________________ Should you have questions after your visit to the [facility], please contact our office at [phone] between the hours of 8:30 and 4:30 p.m.    Voicemails left after 4:30p.m will not be returned until the following business day.   For prescription refill requests, please have your pharmacy contact us directly.  Please also try to allow 48 hours for prescription requests.   Please contact the scheduling department for questions regarding scheduling.  For scheduling of procedures such as PET scans, CT scans, MRI, Ultrasound, etc please contact central scheduling at [phone].   Resources For Cancer Patients and Caregivers:  American Cancer Society:  800-227-2345  Can help patients locate various types of support and financial assistance Cancer Care: 1-800-813-HOPE (4673) Provides financial assistance, online support groups, medication/co-pay assistance.   [place] DSS:  336-641-3447 Where to apply for food stamps, Medicaid, and utility assistance Medicare Rights Center: 800-333-4114 Helps people with Medicare understand their rights and benefits, navigate the Medicare system, and secure the  quality healthcare they deserve SCAT: 336-333-6589  [employer]'s shared-ride transportation service for eligible riders who have a disability that prevents them from riding the fixed route bus.   For additional information on assistance programs please contact our social worker:   Grier Hock/Abigail [place]:  [phone] 

## 2017-03-04 NOTE — Progress Notes (Signed)
HEMATOLOGY/ONCOLOGY CLINIC NOTE  Date of Service: .03/04/2017  PCP: [facility] COMPLAINTS/PURPOSE OF CONSULTATION:  Chronic ITP now with relapse  HISTORY OF PRESENTING ILLNESS:   Kiara Barrera is a wonderful 41 y.o. female who has been referred to [place] by [facility] for evaluation and management of Chronic ITP.  Patient was been managed for her chronic ITP by Dr Yvone Neu at [facility] in [place], [place] and recently moved to [place] and wants to establish care here.  As per review of outside records the patient was diagnosed with ITP since 2012 in [place] and has been treated with prednisone on off since then. She apparently had significant weight gain with prednisone and had chronically low platelets despite the prednisone. She was then started on Promacta 50 mg by mouth daily in June 2016 and had a very good response with platelet counts of more than 500k. Her Promacta was discontinued in July 2016. She had relapse of her ITP in September 2016 with platelet count was down to 49k any significant bleeding or bruising. He has been on variable doses of Promacta since then and reports that she is currently taking 25 mg on Monday Wednesday and Friday.  He has had previous iron deficiency due to heavy periods which was treated with oral iron replacement.  INTERVAL HISTORY  Kiara Barrera is here for followup of her chronic ITP . She notes no acute issues with bleeding or bruising. Platelet counts are 94k today on Promacta 25 mg by mouth daily. Patient notes that she has been compliant with her medication. Is enjoying her new job but noted somewhat stressful. Has had what sounds like a tension headache. Recommended to continue follow-up with her primary care physician for management of other medical comorbidities. Notes that it's been a while since her thyroid function tests  were rechecked with her primary doctor.  No other acute new symptoms.  MEDICAL HISTORY:  #1 chronic ITP #2 Iron deficiency anemia due to heavy periods #3 hypothyroidism #4 degenerative arthritis #5 anxiety disorder #6 history of pulmonary embolism in 2004 status post IVC filter placement. 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 #7 migraine headaches  SURGICAL HISTORY: Past Surgical History:  Procedure Laterality Date  . C secton  2002  . IVC FILTER PLACEMENT ([facility] HX)  2004  . Left axillary lymph node excision biopsy  2008    SOCIAL HISTORY: Social History   Social History  . Marital status: Single    Spouse name: N/A  . Number of children: N/A  . Years of education: N/A   Occupational History  . Not on file.   Social History Main Topics  . Smoking status: Never Smoker  . Smokeless tobacco: Never Used  . Alcohol use Yes     Comment: occasional: not that often  . Drug use: No  . Sexual activity: Not on file   Other Topics Concern  . Not on file   Social History Narrative  . No narrative on file  Recently moved from [place] , [place] to [place]. Nonsmoker minimal social alcohol use no drug use Currently unemployed Has a 77 year old daughter  FAMILY HISTORY: No known history of blood disorders Mother had a history of cervical cancer  maternal grandmother diabetes type 2 Maternal uncle diabetes  type 2  ALLERGIES:  has No Known Allergies.  MEDICATIONS:  Current Outpatient Prescriptions  Medication Sig Dispense Refill  . Biotin 1000 MCG tablet Take 1,000 mcg by mouth daily.    . Calcium Carbonate-Vitamin D (CALCIUM 600/VITAMIN D PO) Take by mouth.    . Cyanocobalamin (B-12) 1000 MCG SUBL Place 1,000 mcg under the tongue daily. 30 each 3  . eltrombopag (PROMACTA) 25 MG tablet Take 1 tablet (25 mg total) by mouth daily. Take on an empty stomach 1 hour  before a meal or 2 hours after 30 tablet 2  . iron polysaccharides (NIFEREX) 150 MG capsule Take 1 capsule (150 mg total) by mouth 2 (two) times daily. (Patient taking differently: Take 150 mg by mouth daily. )    . levothyroxine (SYNTHROID, LEVOTHROID) 100 MCG tablet Take 1 tablet (100 mcg total) by mouth daily before breakfast. 30 tablet 1  . Multiple Vitamin (MULTIVITAMIN) tablet Take 1 tablet by mouth daily.     No current facility-administered medications for this visit.     REVIEW OF SYSTEMS:    10 Point review of Systems was done is negative except as noted above.  PHYSICAL EXAMINATION: ECOG PERFORMANCE STATUS: 1 - Symptomatic but completely ambulatory  . Vitals:   03/04/17 0902  BP: 138/87  Pulse: 82  Resp: 20  Temp: 98 F (36.7 C)  SpO2: 100%   Filed Weights   03/04/17 0902  Weight: (!) 321 lb 9.6 oz (145.9 kg)   .Body mass index is 58.82 kg/m.  GENERAL:alert, in no acute distress and comfortable SKIN: skin color, texture, turgor are normal, no rashes or significant lesions EYES: normal, conjunctiva are pink and non-injected, sclera clear OROPHARYNX:no exudate, no erythema and lips, buccal mucosa, and tongue normal  NECK: supple, no JVD, thyroid normal size, non-tender, without nodularity LYMPH:  no palpable lymphadenopathy in the cervical, axillary or inguinal LUNGS: clear to auscultation with normal respiratory effort HEART: regular rate & rhythm,  no murmurs and no lower extremity edema ABDOMEN: abdomen soft, non-tender, normoactive bowel sounds  Musculoskeletal: no cyanosis of digits and no clubbing  PSYCH: alert & oriented x 3 with fluent speech NEURO: no focal motor/sensory deficits  LABORATORY DATA:  I have reviewed the data as listed  . CBC Latest Ref Rng & Units 03/04/2017 02/04/2017 01/06/2017  WBC 3.9 - 10.3 10e3/uL 4.9 4.8 8.3  Hemoglobin 11.6 - 15.9 g/dL 11.8 12.2 11.8  Hematocrit 34.8 - 46.6 % 35.4 36.4 34.9  Platelets 145 - 400 10e3/uL 94(L)  367 134(L)    . CMP Latest Ref Rng & Units 03/04/2017 02/04/2017 01/06/2017  Glucose 70 - 140 mg/dl 97 96 107  BUN 7.0 - 26.0 mg/dL 11.8 8.7 8.0  Creatinine 0.6 - 1.1 mg/dL 0.8 0.7 0.7  Sodium 136 - 145 mEq/L 140 139 139  Potassium 3.5 - 5.1 mEq/L 4.0 4.2 4.0  CO2 22 - 29 mEq/L 25 26 25   Calcium 8.4 - 10.4 mg/dL 8.9 9.4 9.1  Total Protein 6.4 - 8.3 g/dL 6.8 7.3 7.1  Total Bilirubin 0.20 - 1.20 mg/dL 0.30 0.31 0.47  Alkaline Phos 40 - 150 U/L 88 92 92  AST 5 - 34 U/L 21 24 21   ALT 0 - 55 U/L 18 24 23      RADIOGRAPHIC STUDIES: I have personally reviewed the radiological images as listed and agreed with the findings in the report. No results found.  ASSESSMENT & PLAN:   41 year old African-American female with   1) Chronic ITP since  2012 now with acute relapse of her ITP with very labile platelet counts No overt bleeding at this time. Patient has been on and off steroids and has been on promacta since 2016. Was apparently on Promacta 25mg  po MWF. No issues with bleeding at this time. No petechiae B12 wnl Ultrasound abdomen 06/02/2016- did show some mild splenomegaly which could be an additional factor in her thrombocytopenia. The spleen measures 12.9 x 13.9 x 6.7 cm. The calculated volume is 628 cc. Unclear etiology of splenomegaly. Liver appeared normal.  Patient's platelet counts have normalized after treatment with Rituxan weekly 4 doses with platelets improving from 18-20k to 365k. Her platelet counts are somewhat lower but adequate at 94k  Plan -Discussed lab results with the patient and answered her questions. -At this time her platelets are adequate at 94k -We shall continue her Promacta at 25 mg by mouth daily and adjust the dose up as needed to maintain platelet counts more than 50k. -absolutely avoid NSAIDS, ASA and similar products -Patient again counseled on importance of weight loss and remaining physically active.  2) Iron deficiency Anemia - likely from heavy  menstrual losses. Hemoglobin has improved from 10.5 to 12.2. Today stable at 11.8. Tolerating the po iron without any significant issues  Lab Results  Component Value Date   FERRITIN 42 02/04/2017  Plan -Continue oral iron replacement to iron polysaccharide 150 mg by mouth twice a day till ferritin close to 100  3)  Hypothyroidism - on levothyroxine As per primary care physician  4) . Patient Active Problem List   Diagnosis Date Noted  . Encntr for gyn exam (general) (routine) w/o abn findings 10/13/2016  . Exposure to STD 10/13/2016  . Chronic ITP (idiopathic thrombocytopenia) (HCC) 05/03/2016  . Thrombocytopenia ([place]) 04/19/2016  . Iron deficiency anemia 04/19/2016  . Hypothyroidism 04/19/2016  . Degenerative arthritis 04/19/2016  . Generalized anxiety disorder 04/19/2016   -continue f/u with PCP  5) Obesity  -discussed lifestyle modifications including need for appropriate diet and exercise.  6) H/o PE in 2004-- still has IVC filter in situ. Thought to be triggered by immobility and obesity. -not on anticoagulation at this time.  RTC with labs in 6 weeks with Dr Irene Limbo  All of the patients questions were answered with apparent satisfaction. The patient knows to call the clinic with any problems, questions or concerns.  I spent 15 minutes counseling the patient face to face. The total time spent in the appointment was 20 minutes and more than 50% was on counseling and direct patient cares.    Sullivan Lone MD [place] AAHIVMS [facility] Hematology/Oncology Physician [facility]  (Office):       [phone] (Work cell):  [phone] (Fax):           [phone]

## 2017-03-15 MED FILL — PROMACTA 25 MG TABLET: 25 | 30 days supply | Qty: 30 | Fill #1

## 2017-03-18 ENCOUNTER — Telehealth: Payer: Self-pay

## 2017-03-18 NOTE — Telephone Encounter (Signed)
error 

## 2017-03-23 ENCOUNTER — Other Ambulatory Visit: Payer: Self-pay

## 2017-03-23 ENCOUNTER — Telehealth: Payer: Self-pay

## 2017-03-23 DIAGNOSIS — D693 Immune thrombocytopenic purpura: Secondary | ICD-10-CM

## 2017-03-23 MED ORDER — ELTROMBOPAG OLAMINE 25 MG PO TABS: 25.0000 mg | ORAL_TABLET | Freq: Every day | ORAL | 1 refills | Status: DC

## 2017-03-23 NOTE — Telephone Encounter (Signed)
[place] to confirm refill for promacta just sent in. Called pt to let her know it would be refilled, but she had already received a new bottle in the mail. [place] back to cancel new order. Pt should still have 2 refills available.

## 2017-03-23 NOTE — Telephone Encounter (Signed)
Called multiple times by [facility] to fill promacta for pt. Already using [facility] to fill. Request removed from their system.

## 2017-04-06 MED FILL — PROMACTA 25 MG TABLET: 25 | 30 days supply | Qty: 30 | Fill #2

## 2017-04-13 NOTE — Progress Notes (Signed)
HEMATOLOGY/ONCOLOGY CLINIC NOTE  Date of Service: 04/15/17  PCP: [facility] COMPLAINTS/PURPOSE OF CONSULTATION:  Chronic ITP now with relapse  HISTORY OF PRESENTING ILLNESS:   Kiara Barrera is a wonderful 41 y.o. female who has been referred to [place] by [place] for evaluation and management of Chronic ITP.  Patient was been managed for her chronic ITP by Dr Yvone Neu at [facility] in [place], [place] and recently moved to [place] and wants to establish care here.  As per review of outside records the patient was diagnosed with ITP since 2012 in [place] and has been treated with prednisone on off since then. She apparently had significant weight gain with prednisone and had chronically low platelets despite the prednisone. She was then started on Promacta 50 mg by mouth daily in June 2016 and had a very good response with platelet counts of more than 500k. Her Promacta was discontinued in July 2016. She had relapse of her ITP in September 2016 with platelet count was down to 49k any significant bleeding or bruising. He has been on variable doses of Promacta since then and reports that she is currently taking 25 mg on Monday Wednesday and Friday.  He has had previous iron deficiency due to heavy periods which was treated with oral iron replacement.  INTERVAL HISTORY  Kiara Barrera is here for followup of her chronic ITP. She denies any recent episodes of acute issues with bleeding or bruising. Her platelet counts are 148K today and she continues to be on Promacta 25mg  PO daily; she states that she has been compliant with this medication since we last saw her in clinic. She reports that in her new job that she switched into the night shift which she will only work moving forward. She states that this has been messing with her sleeping schedule and this has been frustrating  her and has been increasingly stressful on her somewhat. Otherwise, she is doing well and without acute complaint or systems. Recommended to continue follow-up with her primary care physician for management of other medical comorbidities.   On ROS, she denies abdominal pain, bleeding/bruising problems, leg swelling.   MEDICAL HISTORY:  #1 chronic ITP #2 Iron deficiency anemia due to heavy periods #3 hypothyroidism #4 degenerative arthritis #5 anxiety disorder #6 history of pulmonary embolism in 2004 status post IVC filter placement. 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 #7 migraine headaches  SURGICAL HISTORY: Past Surgical History:  Procedure Laterality Date  . C secton  2002  . IVC FILTER PLACEMENT ([facility] HX)  2004  . Left axillary lymph node excision biopsy  2008    SOCIAL HISTORY: Social History   Social History  . Marital status: Single    Spouse name: N/A  . Number of children: N/A  . Years of education: N/A   Occupational History  . Not on file.   Social History Main Topics  . Smoking status: Never Smoker  . Smokeless tobacco: Never Used  . Alcohol use Yes     Comment: occasional: not that often  . Drug use: No  . Sexual activity: Not on file   Other Topics Concern  . Not on file   Social History Narrative  . No narrative on file  Recently moved from [place] to [place]. Nonsmoker minimal social alcohol use no drug use Currently unemployed Has a 33 year old daughter  FAMILY HISTORY: No known history of blood disorders Mother had a history of cervical cancer  maternal grandmother diabetes type 2 Maternal uncle diabetes type 2  ALLERGIES:  has No Known Allergies.  MEDICATIONS:  Current Outpatient Prescriptions  Medication Sig Dispense Refill  . Biotin 1000 MCG tablet Take 1,000 mcg by mouth daily.    . Calcium Carbonate-Vitamin D  (CALCIUM 600/VITAMIN D PO) Take by mouth.    . Cyanocobalamin (B-12) 1000 MCG SUBL Place 1,000 mcg under the tongue daily. 30 each 3  . eltrombopag (PROMACTA) 25 MG tablet Take 1 tablet (25 mg total) by mouth daily. Take on an empty stomach 1 hour before a meal or 2 hours after 30 tablet 1  . iron polysaccharides (NIFEREX) 150 MG capsule Take 1 capsule (150 mg total) by mouth 2 (two) times daily. (Patient taking differently: Take 150 mg by mouth daily. )    . levothyroxine (SYNTHROID, LEVOTHROID) 100 MCG tablet Take 1 tablet (100 mcg total) by mouth daily before breakfast. 30 tablet 0  . Multiple Vitamin (MULTIVITAMIN) tablet Take 1 tablet by mouth daily.     No current facility-administered medications for this visit.     REVIEW OF SYSTEMS:   A 10+ POINT REVIEW OF SYSTEMS WAS OBTAINED including neurology, dermatology, psychiatry, cardiac, respiratory, lymph, extremities, GI, GU, Musculoskeletal, constitutional, breasts, reproductive, HEENT.  All pertinent positives are noted in the HPI.  All others are negative.  PHYSICAL EXAMINATION: ECOG PERFORMANCE STATUS: 1 - Symptomatic but completely ambulatory  . Vitals:   04/15/17 0846  BP: 128/74  Pulse: 84  Resp: 18  Temp: 97.9 F (36.6 C)  SpO2: 100%   Filed Weights   04/15/17 0846  Weight: (!) 321 lb 3.2 oz (145.7 kg)   .Body mass index is 58.75 kg/m.  GENERAL:alert, in no acute distress and comfortable SKIN: skin color, texture, turgor are normal, no rashes or significant lesions EYES: normal, conjunctiva are pink and non-injected, sclera clear OROPHARYNX:no exudate, no erythema and lips, buccal mucosa, and tongue normal  NECK: supple, no JVD, thyroid normal size, non-tender, without nodularity LYMPH:  no palpable lymphadenopathy in the cervical, axillary or inguinal LUNGS: clear to auscultation with normal respiratory effort HEART: regular rate & rhythm,  no murmurs and no lower extremity edema ABDOMEN: abdomen soft,  non-tender, normoactive bowel sounds  Musculoskeletal: no cyanosis of digits and no clubbing  PSYCH: alert & oriented x 3 with fluent speech NEURO: no focal motor/sensory deficits  LABORATORY DATA:  I have reviewed the data as listed  . CBC Latest Ref Rng & Units 04/15/2017 03/04/2017 02/04/2017  WBC 3.9 - 10.3 10e3/uL 5.5 4.9 4.8  Hemoglobin 11.6 - 15.9 g/dL 12.2 11.8 12.2  Hematocrit 34.8 - 46.6 % 37.4 35.4 36.4  Platelets 145 - 400 10e3/uL 148 94(L) 367    . CMP Latest Ref Rng & Units 04/15/2017 03/04/2017 02/04/2017  Glucose 70 - 140 mg/dl 95 97 96  BUN 7.0 - 26.0 mg/dL 10.3 11.8 8.7  Creatinine 0.6 - 1.1 mg/dL 0.8 0.8 0.7  Sodium 136 - 145 mEq/L 142 140 139  Potassium 3.5 - 5.1 mEq/L 4.4 4.0 4.2  CO2 22 - 29 mEq/L 26 25 26   Calcium 8.4 - 10.4 mg/dL 9.2 8.9 9.4  Total Protein 6.4 - 8.3 g/dL 7.4 6.8 7.3  Total Bilirubin 0.20 - 1.20 mg/dL 0.45 0.30 0.31  Alkaline Phos 40 -  150 U/L 95 88 92  AST 5 - 34 U/L 21 21 24   ALT 0 - 55 U/L 17 18 24      RADIOGRAPHIC STUDIES: I have personally reviewed the radiological images as listed and agreed with the findings in the report. No results found.  ASSESSMENT & PLAN:   41 year old African-American female with   1) Chronic ITP since 2012 now with acute relapse of her ITP with very labile platelet counts No overt bleeding at this time. Patient has been on and off steroids and has been on promacta since 2016. Was apparently on Promacta 25mg  po MWF. No issues with bleeding at this time. No petechiae B12 wnl. Ultrasound abdomen 06/02/2016- did show some mild splenomegaly which could be an additional factor in her thrombocytopenia. The spleen measures 12.9 x 13.9 x 6.7 cm. The calculated volume is 628 cc. Unclear etiology of splenomegaly. Liver appeared normal.  Patient's platelet counts have normalized after treatment with Rituxan weekly 4 doses with platelets improving from 18-20k to 365k. Her platelet counts have improved to almost  normal at 148K.  Plan -Discussed lab results with the patient and answered her questions. -At this time her platelets have improved well at 148K.  -We shall continue her Promacta at 25 mg by mouth daily and adjust the dose up as needed to maintain platelet counts more than 50k. If her platelets trend the same by her next appointment, we may scale her medication back to 5 days a week -absolutely avoid NSAIDS, ASA and similar products -Patient was again counseled on importance of weight loss and remaining physically active.  2) Iron deficiency Anemia - likely from heavy menstrual losses. Hemoglobin has improved from 10.5 to 12.2. Today stable at 12.2.  -Tolerating the po iron without any significant issues  Lab Results  Component Value Date   FERRITIN 43 04/15/2017  Plan -Continue oral iron replacement to iron polysaccharide 150 mg by mouth twice a day till ferritin close to 100  3)  Hypothyroidism - on levothyroxine As per primary care physician  4) . Patient Active Problem List   Diagnosis Date Noted  . Encntr for gyn exam (general) (routine) w/o abn findings 10/13/2016  . Exposure to STD 10/13/2016  . Chronic ITP (idiopathic thrombocytopenia) (HCC) 05/03/2016  . Thrombocytopenia ([facility]) 04/19/2016  . Iron deficiency anemia 04/19/2016  . Hypothyroidism 04/19/2016  . Degenerative arthritis 04/19/2016  . Generalized anxiety disorder 04/19/2016  -continue f/u with PCP  5) Obesity  -discussed lifestyle modifications including need for appropriate diet and exercise.  6) H/o PE in 2004-- still has IVC filter in situ. Thought to be triggered by immobility and obesity. -not on anticoagulation at this time.  RTC with labs in 4 weeks with Dr Irene Limbo  All of the patients questions were answered with apparent satisfaction. The patient knows to call the clinic with any problems, questions or concerns.  I spent 20 minutes counseling the patient face to face. The total time spent in the  appointment was 25 minutes and more than 50% was on counseling and direct patient cares.  Sullivan Lone MD [place] AAHIVMS [facility] Hematology/Oncology Physician [place]  (Office):       [phone] (Work cell):  [phone] (Fax):           [phone]  This document serves as a record of services personally performed by Sullivan Lone, MD. It was created on his behalf by Reola Mosher, a trained medical scribe. The creation of this record  is based on the scribe's personal observations and the provider's statements to them. This document has been checked and approved by the attending provider.

## 2017-04-14 ENCOUNTER — Other Ambulatory Visit: Payer: Self-pay

## 2017-04-14 ENCOUNTER — Other Ambulatory Visit: Payer: Self-pay | Admitting: Hematology

## 2017-04-14 DIAGNOSIS — D509 Iron deficiency anemia, unspecified: Secondary | ICD-10-CM

## 2017-04-14 MED ORDER — LEVOTHYROXINE SODIUM 100 MCG PO TABS: 100.0000 ug | ORAL_TABLET | Freq: Every day | ORAL | 0 refills | Status: DC

## 2017-04-15 ENCOUNTER — Encounter: Payer: Self-pay | Admitting: Hematology

## 2017-04-15 ENCOUNTER — Ambulatory Visit (HOSPITAL_BASED_OUTPATIENT_CLINIC_OR_DEPARTMENT_OTHER): Payer: Medicaid Other | Admitting: Hematology

## 2017-04-15 ENCOUNTER — Telehealth: Payer: Self-pay | Admitting: Hematology

## 2017-04-15 ENCOUNTER — Other Ambulatory Visit (HOSPITAL_BASED_OUTPATIENT_CLINIC_OR_DEPARTMENT_OTHER): Payer: Medicaid Other

## 2017-04-15 VITALS — BP 128/74 | HR 84 | Temp 97.9°F | Resp 18 | Ht 62.0 in | Wt 321.2 lb

## 2017-04-15 DIAGNOSIS — D693 Immune thrombocytopenic purpura: Secondary | ICD-10-CM | POA: Diagnosis not present

## 2017-04-15 DIAGNOSIS — D509 Iron deficiency anemia, unspecified: Secondary | ICD-10-CM

## 2017-04-15 LAB — CBC WITH DIFFERENTIAL/PLATELET
BASO%: 0.4 % (ref 0.0–2.0)
BASOS ABS: 0 10*3/uL (ref 0.0–0.1)
EOS ABS: 0.1 10*3/uL (ref 0.0–0.5)
EOS%: 1.7 % (ref 0.0–7.0)
HEMATOCRIT: 37.4 % (ref 34.8–46.6)
HGB: 12.2 g/dL (ref 11.6–15.9)
LYMPH#: 1 10*3/uL (ref 0.9–3.3)
LYMPH%: 17.4 % (ref 14.0–49.7)
MCH: 24.4 pg — AB (ref 25.1–34.0)
MCHC: 32.6 g/dL (ref 31.5–36.0)
MCV: 74.8 fL — AB (ref 79.5–101.0)
MONO#: 0.5 10*3/uL (ref 0.1–0.9)
MONO%: 9.2 % (ref 0.0–14.0)
NEUT%: 71.3 % (ref 38.4–76.8)
NEUTROS ABS: 3.9 10*3/uL (ref 1.5–6.5)
Platelets: 148 10*3/uL (ref 145–400)
RBC: 5 10*6/uL (ref 3.70–5.45)
RDW: 15.5 % — AB (ref 11.2–14.5)
WBC: 5.5 10*3/uL (ref 3.9–10.3)

## 2017-04-15 LAB — COMPREHENSIVE METABOLIC PANEL
ALBUMIN: 3.5 g/dL (ref 3.5–5.0)
ALK PHOS: 95 U/L (ref 40–150)
ALT: 17 U/L (ref 0–55)
AST: 21 U/L (ref 5–34)
Anion Gap: 8 mEq/L (ref 3–11)
BILIRUBIN TOTAL: 0.45 mg/dL (ref 0.20–1.20)
BUN: 10.3 mg/dL (ref 7.0–26.0)
CALCIUM: 9.2 mg/dL (ref 8.4–10.4)
CO2: 26 mEq/L (ref 22–29)
Chloride: 107 mEq/L (ref 98–109)
Creatinine: 0.8 mg/dL (ref 0.6–1.1)
Glucose: 95 mg/dl (ref 70–140)
POTASSIUM: 4.4 meq/L (ref 3.5–5.1)
Sodium: 142 mEq/L (ref 136–145)
TOTAL PROTEIN: 7.4 g/dL (ref 6.4–8.3)

## 2017-04-15 LAB — IRON AND TIBC
%SAT: 13 % — ABNORMAL LOW (ref 21–57)
Iron: 54 ug/dL (ref 41–142)
TIBC: 411 ug/dL (ref 236–444)
UIBC: 357 ug/dL (ref 120–384)

## 2017-04-15 LAB — FERRITIN: FERRITIN: 43 ng/mL (ref 9–269)

## 2017-04-15 NOTE — Telephone Encounter (Signed)
Gave avs and calendar for October  °

## 2017-05-02 ENCOUNTER — Other Ambulatory Visit: Payer: Self-pay | Admitting: Hematology

## 2017-05-03 MED FILL — PROMACTA 25 MG TABLET: 25 | 30 days supply | Qty: 30 | Fill #0

## 2017-05-13 ENCOUNTER — Other Ambulatory Visit: Payer: Self-pay

## 2017-05-13 ENCOUNTER — Encounter: Payer: Self-pay | Admitting: Hematology

## 2017-05-13 ENCOUNTER — Other Ambulatory Visit (HOSPITAL_BASED_OUTPATIENT_CLINIC_OR_DEPARTMENT_OTHER): Payer: Medicaid Other

## 2017-05-13 ENCOUNTER — Telehealth: Payer: Self-pay | Admitting: Hematology

## 2017-05-13 ENCOUNTER — Other Ambulatory Visit: Payer: Self-pay | Admitting: Hematology

## 2017-05-13 ENCOUNTER — Ambulatory Visit (HOSPITAL_BASED_OUTPATIENT_CLINIC_OR_DEPARTMENT_OTHER): Payer: Medicaid Other | Admitting: Hematology

## 2017-05-13 VITALS — BP 130/73 | HR 78 | Temp 97.9°F | Resp 16 | Ht 62.0 in | Wt 314.0 lb

## 2017-05-13 DIAGNOSIS — D693 Immune thrombocytopenic purpura: Secondary | ICD-10-CM

## 2017-05-13 DIAGNOSIS — E039 Hypothyroidism, unspecified: Secondary | ICD-10-CM

## 2017-05-13 DIAGNOSIS — N920 Excessive and frequent menstruation with regular cycle: Secondary | ICD-10-CM

## 2017-05-13 DIAGNOSIS — E6609 Other obesity due to excess calories: Secondary | ICD-10-CM

## 2017-05-13 DIAGNOSIS — D500 Iron deficiency anemia secondary to blood loss (chronic): Secondary | ICD-10-CM

## 2017-05-13 DIAGNOSIS — Z86711 Personal history of pulmonary embolism: Secondary | ICD-10-CM

## 2017-05-13 DIAGNOSIS — D509 Iron deficiency anemia, unspecified: Secondary | ICD-10-CM

## 2017-05-13 DIAGNOSIS — E538 Deficiency of other specified B group vitamins: Secondary | ICD-10-CM

## 2017-05-13 LAB — COMPREHENSIVE METABOLIC PANEL
ALBUMIN: 3.5 g/dL (ref 3.5–5.0)
ALK PHOS: 84 U/L (ref 40–150)
ALT: 17 U/L (ref 0–55)
AST: 20 U/L (ref 5–34)
Anion Gap: 8 mEq/L (ref 3–11)
BUN: 10.9 mg/dL (ref 7.0–26.0)
CALCIUM: 9 mg/dL (ref 8.4–10.4)
CO2: 26 mEq/L (ref 22–29)
CREATININE: 0.8 mg/dL (ref 0.6–1.1)
Chloride: 107 mEq/L (ref 98–109)
EGFR: >60 mL/min/{1.73_m2} (ref >60)
Glucose: 88 mg/dl (ref 70–140)
Potassium: 4.2 mEq/L (ref 3.5–5.1)
Sodium: 140 mEq/L (ref 136–145)
TOTAL PROTEIN: 7.4 g/dL (ref 6.4–8.3)
Total Bilirubin: 0.32 mg/dL (ref 0.20–1.20)

## 2017-05-13 LAB — CBC & DIFF AND RETIC
BASO%: 0.2 % (ref 0.0–2.0)
BASOS ABS: 0 10*3/uL (ref 0.0–0.1)
EOS ABS: 0.1 10*3/uL (ref 0.0–0.5)
EOS%: 2 % (ref 0.0–7.0)
HCT: 35.7 % (ref 34.8–46.6)
HEMOGLOBIN: 11.8 g/dL (ref 11.6–15.9)
Immature Retic Fract: 9 % (ref 1.60–10.00)
LYMPH%: 19.7 % (ref 14.0–49.7)
MCH: 24.3 pg — ABNORMAL LOW (ref 25.1–34.0)
MCHC: 33.1 g/dL (ref 31.5–36.0)
MCV: 73.6 fL — ABNORMAL LOW (ref 79.5–101.0)
MONO#: 0.6 10*3/uL (ref 0.1–0.9)
MONO%: 12.7 % (ref 0.0–14.0)
NEUT#: 3.3 10*3/uL (ref 1.5–6.5)
NEUT%: 65.4 % (ref 38.4–76.8)
Platelets: 156 10*3/uL (ref 145–400)
RBC: 4.85 10*6/uL (ref 3.70–5.45)
RDW: 14.8 % — AB (ref 11.2–14.5)
RETIC %: 0.98 % (ref 0.70–2.10)
RETIC CT ABS: 47.53 10*3/uL (ref 33.70–90.70)
WBC: 5 10*3/uL (ref 3.9–10.3)
lymph#: 1 10*3/uL (ref 0.9–3.3)

## 2017-05-13 MED ORDER — LEVOTHYROXINE SODIUM 100 MCG PO TABS: 100.0000 ug | ORAL_TABLET | Freq: Every day | ORAL | 0 refills | Status: DC

## 2017-05-13 NOTE — Telephone Encounter (Signed)
Gave avs and calendar for December  °

## 2017-05-13 NOTE — Progress Notes (Signed)
HEMATOLOGY/ONCOLOGY CLINIC NOTE  Date of Service: 05/13/17  PCP: [facility] COMPLAINTS/PURPOSE OF CONSULTATION:  Chronic ITP now with relapse  HISTORY OF PRESENTING ILLNESS:   Kiara Barrera is a wonderful 41 y.o. female who has been referred to [place] by [place] for evaluation and management of Chronic ITP.  Patient was been managed for her chronic ITP by Dr Yvone Neu at [facility] in [place], [place] and recently moved to [place] and wants to establish care here.  As per review of outside records the patient was diagnosed with ITP since 2012 in [place] and has been treated with prednisone on off since then. She apparently had significant weight gain with prednisone and had chronically low platelets despite the prednisone. She was then started on Promacta 50 mg by mouth daily in June 2016 and had a very good response with platelet counts of more than 500k. Her Promacta was discontinued in July 2016. She had relapse of her ITP in September 2016 with platelet count was down to 49k any significant bleeding or bruising. He has been on variable doses of Promacta since then and reports that she is currently taking 25 mg on Monday Wednesday and Friday.  He has had previous iron deficiency due to heavy periods which was treated with oral iron replacement.  INTERVAL HISTORY  Ms Kiara Barrera is here for followup of her chronic ITP. She continues to do well in the interim. She denies any recent episodes of acute issues with bleeding or bruising. Her platelet counts are 156K today and she continues to be on Promacta 25mg  PO daily; she states that she has been compliant with this medication since we last saw her in clinic. Her new job has been treating her well, but she has stayed very busy overall. Otherwise, she is doing well and without acute complaint or systems. Recommended to  continue follow-up with her primary care physician for management of other medical comorbidities.   On ROS, she denies abdominal pain, bleeding/bruising problems, leg swelling.   MEDICAL HISTORY:  #1 chronic ITP #2 Iron deficiency anemia due to heavy periods #3 hypothyroidism #4 degenerative arthritis #5 anxiety disorder #6 history of pulmonary embolism in 2004 status post IVC filter placement. 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 #7 migraine headaches  SURGICAL HISTORY: Past Surgical History:  Procedure Laterality Date  . C secton  2002  . IVC FILTER PLACEMENT ([facility] HX)  2004  . Left axillary lymph node excision biopsy  2008    SOCIAL HISTORY: Social History   Social History  . Marital status: Single    Spouse name: N/A  . Number of children: N/A  . Years of education: N/A   Occupational History  . Not on file.   Social History Main Topics  . Smoking status: Never Smoker  . Smokeless tobacco: Never Used  . Alcohol use Yes     Comment: occasional: not that often  . Drug use: No  . Sexual activity: Not on file   Other Topics Concern  . Not on file   Social History Narrative  . No narrative on file  Recently moved from [place], [place] to [place]. Nonsmoker minimal social alcohol use no drug use Currently unemployed Has a 24 year old daughter  FAMILY HISTORY: No known history of  blood disorders Mother had a history of cervical cancer  maternal grandmother diabetes type 2 Maternal uncle diabetes type 2  ALLERGIES:  has No Known Allergies.  MEDICATIONS:  Current Outpatient Prescriptions  Medication Sig Dispense Refill  . Biotin 1000 MCG tablet Take 1,000 mcg by mouth daily.    . Calcium Carbonate-Vitamin D (CALCIUM 600/VITAMIN D PO) Take by mouth.    . Cyanocobalamin (B-12) 1000 MCG SUBL Place 1,000 mcg under the tongue daily. 30 each 3  . iron  polysaccharides (NIFEREX) 150 MG capsule Take 1 capsule (150 mg total) by mouth 2 (two) times daily. (Patient taking differently: Take 150 mg by mouth daily. )    . levothyroxine (SYNTHROID, LEVOTHROID) 100 MCG tablet Take 1 tablet (100 mcg total) by mouth daily before breakfast. 30 tablet 0  . Multiple Vitamin (MULTIVITAMIN) tablet Take 1 tablet by mouth daily.    [facility] PROMACTA 25 MG tablet TAKE 1 TABLET (25 MG TOTAL) BY MOUTH DAILY. TAKE ON AN EMPTY STOMACH 1 HOUR BEFORE A MEAL OR 2 HOURS AFTER 30 tablet 2   No current facility-administered medications for this visit.     REVIEW OF SYSTEMS:   A 10+ POINT REVIEW OF SYSTEMS WAS OBTAINED including neurology, dermatology, psychiatry, cardiac, respiratory, lymph, extremities, GI, GU, Musculoskeletal, constitutional, breasts, reproductive, HEENT.  All pertinent positives are noted in the HPI.  All others are negative.  PHYSICAL EXAMINATION: ECOG PERFORMANCE STATUS: 1 - Symptomatic but completely ambulatory  . Vitals:   05/13/17 0912  BP: 130/73  Pulse: 78  Resp: 16  Temp: 97.9 F (36.6 C)  SpO2: 100%   Filed Weights   05/13/17 0912  Weight: (!) 314 lb (142.4 kg)   .Body mass index is 57.43 kg/m.  GENERAL:alert, in no acute distress and comfortable SKIN: skin color, texture, turgor are normal, no rashes or significant lesions EYES: normal, conjunctiva are pink and non-injected, sclera clear OROPHARYNX:no exudate, no erythema and lips, buccal mucosa, and tongue normal  NECK: supple, no JVD, thyroid normal size, non-tender, without nodularity LYMPH:  no palpable lymphadenopathy in the cervical, axillary or inguinal LUNGS: clear to auscultation with normal respiratory effort HEART: regular rate & rhythm,  no murmurs and no lower extremity edema ABDOMEN: abdomen soft, non-tender, normoactive bowel sounds  Musculoskeletal: no cyanosis of digits and no clubbing  PSYCH: alert & oriented x 3 with fluent speech NEURO: no focal motor/sensory  deficits  LABORATORY DATA:  I have reviewed the data as listed  . CBC Latest Ref Rng & Units 05/13/2017 04/15/2017 03/04/2017  WBC 3.9 - 10.3 10e3/uL 5.0 5.5 4.9  Hemoglobin 11.6 - 15.9 g/dL 11.8 12.2 11.8  Hematocrit 34.8 - 46.6 % 35.7 37.4 35.4  Platelets 145 - 400 10e3/uL 156 148 94(L)    . CMP Latest Ref Rng & Units 05/13/2017 04/15/2017 03/04/2017  Glucose 70 - 140 mg/dl 88 95 97  BUN 7.0 - 26.0 mg/dL 10.9 10.3 11.8  Creatinine 0.6 - 1.1 mg/dL 0.8 0.8 0.8  Sodium 136 - 145 mEq/L 140 142 140  Potassium 3.5 - 5.1 mEq/L 4.2 4.4 4.0  CO2 22 - 29 mEq/L 26 26 25   Calcium 8.4 - 10.4 mg/dL 9.0 9.2 8.9  Total Protein 6.4 - 8.3 g/dL 7.4 7.4 6.8  Total Bilirubin 0.20 - 1.20 mg/dL 0.32 0.45 0.30  Alkaline Phos 40 - 150 U/L 84 95 88  AST 5 - 34 U/L 20 21 21   ALT 0 - 55 U/L 17 17 18  RADIOGRAPHIC STUDIES: I have personally reviewed the radiological images as listed and agreed with the findings in the report. No results found.  ASSESSMENT & PLAN:   41 year old African-American female with   1) Chronic ITP since 2012 now with acute relapse of her ITP with very labile platelet counts No overt bleeding at this time. Patient has been on and off steroids and has been on promacta since 2016. Was apparently on Promacta 25mg  po MWF. No issues with bleeding at this time. No petechiae B12 wnl. Ultrasound abdomen 06/02/2016- did show some mild splenomegaly which could be an additional factor in her thrombocytopenia. The spleen measures 12.9 x 13.9 x 6.7 cm. The calculated volume is 628 cc. Unclear etiology of splenomegaly. Liver appeared normal.  Patient's platelet counts have normalized after treatment with Rituxan weekly 4 doses with platelets improving from 18-20k to 365k. Her platelet counts have improved to almost normal at 156K.  Plan -Discussed lab results with the patient and answered her questions. -At this time her platelets have improved well at 156K.  -We shall continue her  Promacta at 25 mg by mouth daily and adjust the dose up as needed to maintain platelet counts more than 50k.  -Since her platelets have remained stable overall on this dose of Promacta, I discussed that if they remain at this same level we might in the future plan to reduce the Promacta to 4-5 days a week. -absolutely avoid NSAIDS, ASA and similar products -Patient was again counseled on importance of weight loss and remaining physically active.  2) Iron deficiency Anemia - likely from heavy menstrual losses. Hemoglobin has improved from 10.5 to 12.2. Today stable at 11.8.  -Tolerating the po iron without any significant issues. She has not been taking PO iron recently because she states that she forgot to pick up her medication. I encouraged her to be more compliant with her medication to improve her iron levels to avoid IV iron. She will pick up some more of this asap.   Lab Results  Component Value Date   FERRITIN 43 04/15/2017  Plan -Continue oral iron replacement to iron polysaccharide 150 mg by mouth twice a day till ferritin close to 100 -rpt Iron labs on next visit  3)  Hypothyroidism - on levothyroxine As per primary care physician  4) . Patient Active Problem List   Diagnosis Date Noted  . Encntr for gyn exam (general) (routine) w/o abn findings 10/13/2016  . Exposure to STD 10/13/2016  . Chronic ITP (idiopathic thrombocytopenia) (HCC) 05/03/2016  . Thrombocytopenia ([place]) 04/19/2016  . Iron deficiency anemia 04/19/2016  . Hypothyroidism 04/19/2016  . Degenerative arthritis 04/19/2016  . Generalized anxiety disorder 04/19/2016  -continue f/u with PCP  5) Obesity  -discussed lifestyle modifications including need for appropriate diet and exercise.  6) H/o PE in 2004-- still has IVC filter in situ. Thought to be triggered by immobility and obesity. -not on anticoagulation at this time.  RTC with labs in 6 weeks with Dr Irene Limbo  All of the patients questions were answered  with apparent satisfaction. The patient knows to call the clinic with any problems, questions or concerns.  I spent 20 minutes counseling the patient face to face. The total time spent in the appointment was 25 minutes and more than 50% was on counseling and direct patient cares.  Sullivan Lone MD [place] AAHIVMS [facility] Hematology/Oncology Physician [facility]  (Office):       [phone] (Work cell):  [phone] (Fax):  [phone]  This document serves as a record of services personally performed by Sullivan Lone, MD. It was created on his behalf by Reola Mosher, a trained medical scribe. The creation of this record is based on the scribe's personal observations and the provider's statements to them. This document has been checked and approved by the attending provider.

## 2017-06-23 MED FILL — PROMACTA 25 MG TABLET: 25 | 30 days supply | Qty: 30 | Fill #1

## 2017-06-24 ENCOUNTER — Ambulatory Visit: Payer: Medicaid Other | Admitting: Hematology

## 2017-06-24 ENCOUNTER — Other Ambulatory Visit: Payer: Medicaid Other

## 2017-07-25 ENCOUNTER — Encounter: Payer: Self-pay | Admitting: Gastroenterology

## 2017-07-27 MED FILL — PROMACTA 25 MG TABLET: 25 | 30 days supply | Qty: 30 | Fill #2

## 2017-08-11 NOTE — Progress Notes (Signed)
HEMATOLOGY/ONCOLOGY CLINIC NOTE  Date of Service: 08/11/17  PCP: [place] COMPLAINTS/PURPOSE OF CONSULTATION:  Chronic ITP now with relapse  HISTORY OF PRESENTING ILLNESS:   Kiara Barrera is a wonderful 42 y.o. female who has been referred to [place] by [facility] for evaluation and management of Chronic ITP.  Patient was been managed for her chronic ITP by Dr Yvone Neu at [facility] in [place], [place] and recently moved to [place] and wants to establish care here.  As per review of outside records the patient was diagnosed with ITP since 2012 in [place] and has been treated with prednisone on off since then. She apparently had significant weight gain with prednisone and had chronically low platelets despite the prednisone. She was then started on Promacta 50 mg by mouth daily in June 2016 and had a very good response with platelet counts of more than 500k. Her Promacta was discontinued in July 2016. She had relapse of her ITP in September 2016 with platelet count was down to 49k any significant bleeding or bruising. He has been on variable doses of Promacta since then and reports that she is currently taking 25 mg on Monday Wednesday and Friday.  He has had previous iron deficiency due to heavy periods which was treated with oral iron replacement.  INTERVAL HISTORY   Kiara Barrera is here for followup of her chronic ITP.  She notes no acute new symptoms. Recently resolving URI. No issues with bleeding or bruising. She notes she has been compliant with the Promacta but is not taking her po iron regularly.  On ROS, she denies abdominal pain, bleeding/bruising problems, leg swelling. }  MEDICAL HISTORY:  #1 chronic ITP #2 Iron deficiency anemia due to heavy periods #3 hypothyroidism #4 degenerative arthritis #5 anxiety disorder #6 history of pulmonary embolism in  2004 status post IVC filter placement. 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 #7 migraine headaches  SURGICAL HISTORY: Past Surgical History:  Procedure Laterality Date  . C secton  2002  . IVC FILTER PLACEMENT ([place] HX)  2004  . Left axillary lymph node excision biopsy  2008    SOCIAL HISTORY: Social History   Socioeconomic History  . Marital status: Single    Spouse name: Not on file  . Number of children: Not on file  . Years of education: Not on file  . Highest education level: Not on file  Social Needs  . Financial resource strain: Not on file  . Food insecurity - worry: Not on file  . Food insecurity - inability: Not on file  . Transportation needs - medical: Not on file  . Transportation needs - non-medical: Not on file  Occupational History  . Not on file  Tobacco Use  . Smoking status: Never Smoker  . Smokeless tobacco: Never Used  Substance and Sexual Activity  . Alcohol use: Yes    Comment: occasional: not that often  . Drug use: No  . Sexual activity: Not on file  Other Topics Concern  . Not on file  Social History Narrative  . Not on file  Recently moved from [place], [place] to [place]. Nonsmoker minimal social alcohol use no drug use Currently unemployed Has a 25 year old daughter  FAMILY HISTORY: No known history of blood disorders Mother  had a history of cervical cancer  maternal grandmother diabetes type 2 Maternal uncle diabetes type 2  ALLERGIES:  has No Known Allergies.  MEDICATIONS:  Current Outpatient Medications  Medication Sig Dispense Refill  . Biotin 1000 MCG tablet Take 1,000 mcg by mouth daily.    . Calcium Carbonate-Vitamin D (CALCIUM 600/VITAMIN D PO) Take by mouth.    . Cyanocobalamin (B-12) 1000 MCG SUBL Place 1,000 mcg under the tongue daily. 30 each 3  . iron polysaccharides (NIFEREX) 150 MG capsule Take 1 capsule (150  mg total) by mouth 2 (two) times daily. (Patient taking differently: Take 150 mg by mouth daily. )    . levothyroxine (SYNTHROID, LEVOTHROID) 100 MCG tablet Take 1 tablet (100 mcg total) by mouth daily before breakfast. 30 tablet 0  . Multiple Vitamin (MULTIVITAMIN) tablet Take 1 tablet by mouth daily.    Marland Kitchen PROMACTA 25 MG tablet TAKE 1 TABLET (25 MG TOTAL) BY MOUTH DAILY. TAKE ON AN EMPTY STOMACH 1 HOUR BEFORE A MEAL OR 2 HOURS AFTER 30 tablet 2   No current facility-administered medications for this visit.     REVIEW OF SYSTEMS:   A 10+ POINT REVIEW OF SYSTEMS WAS OBTAINED including neurology, dermatology, psychiatry, cardiac, respiratory, lymph, extremities, GI, GU, Musculoskeletal, constitutional, breasts, reproductive, HEENT.  All pertinent positives are noted in the HPI.  All others are negative.  PHYSICAL EXAMINATION: ECOG PERFORMANCE STATUS: 1 - Symptomatic but completely ambulatory  . Vitals:   08/16/17 0830  BP: 134/60  Pulse: 76  Resp: 20  Temp: 98.1 F (36.7 C)  SpO2: 100%   Filed Weights   08/16/17 0830  Weight: (!) 324 lb 6.4 oz (147.1 kg)   .Body mass index is 59.33 kg/m. NAD GENERAL:alert, in no acute distress and comfortable SKIN: skin color, texture, turgor are normal, no rashes or significant lesions EYES: normal, conjunctiva are pink and non-injected, sclera clear OROPHARYNX:no exudate, no erythema and lips, buccal mucosa, and tongue normal  NECK: supple, no JVD, thyroid normal size, non-tender, without nodularity LYMPH:  no palpable lymphadenopathy in the cervical, axillary or inguinal LUNGS: clear to auscultation with normal respiratory effort HEART: regular rate & rhythm,  no murmurs and no lower extremity edema ABDOMEN: abdomen soft, non-tender, normoactive bowel sounds  Musculoskeletal: no cyanosis of digits and no clubbing  PSYCH: alert & oriented x 3 with fluent speech NEURO: no focal motor/sensory deficits  LABORATORY DATA:  I have reviewed the  data as listed  . CBC Latest Ref Rng & Units 08/16/2017 05/13/2017 04/15/2017  WBC 3.9 - 10.3 K/uL 5.2 5.0 5.5  Hemoglobin 11.6 - 15.9 g/dL - 11.8 12.2  Hematocrit 34.8 - 46.6 % 36.0 35.7 37.4  Platelets 145 - 400 K/uL 71(L) 156 148    . CMP Latest Ref Rng & Units 08/16/2017 05/13/2017 04/15/2017  Glucose 70 - 140 mg/dL 110 88 95  BUN 7 - 26 mg/dL 11 10.9 10.3  Creatinine 0.60 - 1.10 mg/dL 0.75 0.8 0.8  Sodium 136 - 145 mmol/L 140 140 142  Potassium 3.3 - 4.7 mmol/L 4.2 4.2 4.4  Chloride 98 - 109 mmol/L 106 - -  CO2 22 - 29 mmol/L 28 26 26   Calcium 8.4 - 10.4 mg/dL 8.6 9.0 9.2  Total Protein 6.4 - 8.3 g/dL 6.9 7.4 7.4  Total Bilirubin 0.2 - 1.2 mg/dL 0.4 0.32 0.45  Alkaline Phos 40 - 150 U/L 81 84 95  AST 5 - 34 U/L 15 20 21   ALT 0 - 55  U/L 15 17 17    . Lab Results  Component Value Date   IRON 49 08/16/2017   TIBC 376 08/16/2017   IRONPCTSAT 13 (L) 08/16/2017   (Iron and TIBC)  Lab Results  Component Value Date   FERRITIN 41 08/16/2017      RADIOGRAPHIC STUDIES: I have personally reviewed the radiological images as listed and agreed with the findings in the report. No results found.  ASSESSMENT & PLAN:   42 year old African-American female with   1) Chronic ITP since 2012 now with acute relapse of her ITP with very labile platelet counts No overt bleeding at this time. Patient has been on and off steroids and has been on promacta since 2016. Was apparently on Promacta 25mg  po MWF. No issues with bleeding at this time. No petechiae B12 wnl. Ultrasound abdomen 06/02/2016- did show some mild splenomegaly which could be an additional factor in her thrombocytopenia. The spleen measures 12.9 x 13.9 x 6.7 cm. The calculated volume is 628 cc. Unclear etiology of splenomegaly. Liver appeared normal.  Patient's platelet counts have normalized after treatment with Rituxan weekly 4 doses with platelets improving from 18-20k to 365k. Her platelet counts are stable at  71k  Plan -Discussed lab results with the patient and answered her questions. -At this time her platelets are adequate at 71k.  Somewhat lower from recent Viral URI as well.  -We shall continue her Promacta at 25 mg by mouth daily and adjust the dose up as needed to maintain platelet counts more than 50k.  --absolutely avoid NSAIDS, ASA and similar products -Patient was again counseled on importance of weight loss and remaining physically active.  2) Iron deficiency Anemia - likely from heavy menstrual losses. Hemoglobin has improved from 10.5 to 12.2. Today stable at 11.8  -Tolerating the po iron without any significant issues. She had not been taking PO iron for a period of time because she states that she forgot to pick up her medication. I encouraged her to be more compliant with her medication to improve her iron levels to avoid IV iron. She understands and is agreeable.   Lab Results  Component Value Date   FERRITIN 41 08/16/2017  Plan -Continue oral iron replacement to iron polysaccharide 150 mg by mouth twice a day till ferritin close to 100 -recommended she maintain compliance with this.  3)  Hypothyroidism - on levothyroxine As per primary care physician  4) . Patient Active Problem List   Diagnosis Date Noted  . Encntr for gyn exam (general) (routine) w/o abn findings 10/13/2016  . Exposure to STD 10/13/2016  . Chronic ITP (idiopathic thrombocytopenia) (HCC) 05/03/2016  . Thrombocytopenia ([place]) 04/19/2016  . Iron deficiency anemia 04/19/2016  . Hypothyroidism 04/19/2016  . Degenerative arthritis 04/19/2016  . Generalized anxiety disorder 04/19/2016  -continue f/u with PCP  5) Obesity  -discussed lifestyle modifications including need for appropriate diet and exercise.  6) H/o PE in 2004-- still has IVC filter in situ. Thought to be triggered by immobility and obesity. -not on anticoagulation at this time.  Labs in 4 weeks RTC with Dr Irene Limbo in 8 weeks with  labs  All of the patients questions were answered with apparent satisfaction. The patient knows to call the clinic with any problems, questions or concerns.  I spent 20 minutes counseling the patient face to face. The total time spent in the appointment was 25 minutes and more than 50% was on counseling and direct patient cares.  Sullivan Lone MD [place] AAHIVMS  [facility] Hematology/Oncology Physician [place]  (Office):       [phone] (Work cell):  [phone] (Fax):           [phone]  This document serves as a record of services personally performed by Sullivan Lone, MD. It was created on his behalf by Joslyn Devon, a trained medical scribe. The creation of this record is based on the scribe's personal observations and the provider's statements to them.    .I have reviewed the above documentation for accuracy and completeness, and I agree with the above. .[place]

## 2017-08-16 ENCOUNTER — Encounter: Payer: Self-pay | Admitting: Hematology

## 2017-08-16 ENCOUNTER — Inpatient Hospital Stay (HOSPITAL_BASED_OUTPATIENT_CLINIC_OR_DEPARTMENT_OTHER): Payer: Medicaid Other | Admitting: Hematology

## 2017-08-16 ENCOUNTER — Inpatient Hospital Stay: Payer: Medicaid Other | Attending: Hematology

## 2017-08-16 VITALS — BP 134/60 | HR 76 | Temp 98.1°F | Resp 20 | Ht 62.0 in | Wt 324.4 lb

## 2017-08-16 DIAGNOSIS — D693 Immune thrombocytopenic purpura: Secondary | ICD-10-CM

## 2017-08-16 DIAGNOSIS — E039 Hypothyroidism, unspecified: Secondary | ICD-10-CM | POA: Diagnosis not present

## 2017-08-16 DIAGNOSIS — D509 Iron deficiency anemia, unspecified: Secondary | ICD-10-CM

## 2017-08-16 DIAGNOSIS — E669 Obesity, unspecified: Secondary | ICD-10-CM | POA: Insufficient documentation

## 2017-08-16 DIAGNOSIS — Z86711 Personal history of pulmonary embolism: Secondary | ICD-10-CM

## 2017-08-16 DIAGNOSIS — E538 Deficiency of other specified B group vitamins: Secondary | ICD-10-CM

## 2017-08-16 LAB — CBC WITH DIFFERENTIAL (CANCER CENTER ONLY)
Basophils Absolute: 0 10*3/uL (ref 0.0–0.1)
Basophils Relative: 0 %
Eosinophils Absolute: 0.1 10*3/uL (ref 0.0–0.5)
Eosinophils Relative: 2 %
HEMATOCRIT: 36 % (ref 34.8–46.6)
Hemoglobin: 11.8 g/dL (ref 11.6–15.9)
LYMPHS PCT: 19 %
Lymphs Abs: 1 10*3/uL (ref 0.9–3.3)
MCH: 24.5 pg — ABNORMAL LOW (ref 25.1–34.0)
MCHC: 32.8 g/dL (ref 31.5–36.0)
MCV: 74.7 fL — AB (ref 79.5–101.0)
MONO ABS: 0.5 10*3/uL (ref 0.1–0.9)
Monocytes Relative: 10 %
NEUTROS ABS: 3.6 10*3/uL (ref 1.5–6.5)
Neutrophils Relative %: 69 %
Platelet Count: 71 10*3/uL — ABNORMAL LOW (ref 145–400)
RBC: 4.82 MIL/uL (ref 3.70–5.45)
RDW: 15.8 % (ref 11.2–16.1)
WBC: 5.2 10*3/uL (ref 3.9–10.3)

## 2017-08-16 LAB — IRON AND TIBC
IRON: 49 ug/dL (ref 41–142)
Saturation Ratios: 13 % — ABNORMAL LOW (ref 21–57)
TIBC: 376 ug/dL (ref 236–444)
UIBC: 327 ug/dL

## 2017-08-16 LAB — COMPREHENSIVE METABOLIC PANEL
ALK PHOS: 81 U/L (ref 40–150)
ALT: 15 U/L (ref 0–55)
ANION GAP: 6 (ref 3–11)
AST: 15 U/L (ref 5–34)
Albumin: 3.3 g/dL — ABNORMAL LOW (ref 3.5–5.0)
BILIRUBIN TOTAL: 0.4 mg/dL (ref 0.2–1.2)
BUN: 11 mg/dL (ref 7–26)
CALCIUM: 8.6 mg/dL (ref 8.4–10.4)
CO2: 28 mmol/L (ref 22–29)
Chloride: 106 mmol/L (ref 98–109)
Creatinine, Ser: 0.75 mg/dL (ref 0.60–1.10)
GLUCOSE: 110 mg/dL (ref 70–140)
Potassium: 4.2 mmol/L (ref 3.3–4.7)
Sodium: 140 mmol/L (ref 136–145)
TOTAL PROTEIN: 6.9 g/dL (ref 6.4–8.3)

## 2017-08-16 LAB — RETICULOCYTES
RBC.: 4.82 MIL/uL (ref 3.70–5.45)
RETIC COUNT ABSOLUTE: 53 10*3/uL (ref 33.7–90.7)
Retic Ct Pct: 1.1 % (ref 0.7–2.1)

## 2017-08-16 LAB — FERRITIN: FERRITIN: 41 ng/mL (ref 9–269)

## 2017-08-16 LAB — VITAMIN B12: Vitamin B-12: 629 pg/mL (ref 180–914)

## 2017-08-18 ENCOUNTER — Other Ambulatory Visit: Payer: Self-pay | Admitting: Hematology

## 2017-08-18 ENCOUNTER — Other Ambulatory Visit: Payer: Self-pay

## 2017-08-18 MED ORDER — ELTROMBOPAG OLAMINE 25 MG PO TABS: 25.0000 mg | ORAL_TABLET | Freq: Every day | ORAL | 2 refills | Status: DC

## 2017-08-24 MED FILL — PROMACTA 25 MG TABLET: 25 | 30 days supply | Qty: 30 | Fill #0

## 2017-08-25 ENCOUNTER — Encounter: Payer: Self-pay | Admitting: *Deleted

## 2017-08-29 ENCOUNTER — Encounter: Payer: Self-pay | Admitting: Gastroenterology

## 2017-08-30 ENCOUNTER — Ambulatory Visit: Payer: Medicaid Other | Admitting: Gastroenterology

## 2017-09-13 ENCOUNTER — Inpatient Hospital Stay: Payer: Medicaid Other | Attending: Hematology

## 2017-09-13 DIAGNOSIS — D693 Immune thrombocytopenic purpura: Secondary | ICD-10-CM | POA: Insufficient documentation

## 2017-09-13 LAB — CBC WITH DIFFERENTIAL/PLATELET
BASOS PCT: 0 %
Basophils Absolute: 0 10*3/uL (ref 0.0–0.1)
EOS PCT: 2 %
Eosinophils Absolute: 0.1 10*3/uL (ref 0.0–0.5)
HEMATOCRIT: 37.3 % (ref 34.8–46.6)
Hemoglobin: 12.3 g/dL (ref 11.6–15.9)
Lymphocytes Relative: 20 %
Lymphs Abs: 1.1 10*3/uL (ref 0.9–3.3)
MCH: 24.6 pg — ABNORMAL LOW (ref 25.1–34.0)
MCHC: 33 g/dL (ref 31.5–36.0)
MCV: 74.5 fL — AB (ref 79.5–101.0)
MONO ABS: 0.6 10*3/uL (ref 0.1–0.9)
MONOS PCT: 11 %
NEUTROS ABS: 3.5 10*3/uL (ref 1.5–6.5)
Neutrophils Relative %: 67 %
PLATELETS: 94 10*3/uL — AB (ref 145–400)
RBC: 5.01 MIL/uL (ref 3.70–5.45)
RDW: 15.5 % — AB (ref 11.2–14.5)
WBC: 5.3 10*3/uL (ref 3.9–10.3)

## 2017-09-13 LAB — RETICULOCYTES
RBC.: 5.01 MIL/uL (ref 3.70–5.45)
Retic Count, Absolute: 55.1 10*3/uL (ref 33.7–90.7)
Retic Ct Pct: 1.1 % (ref 0.7–2.1)

## 2017-09-27 MED FILL — PROMACTA 25 MG TABLET: 25 | 30 days supply | Qty: 30 | Fill #1

## 2017-10-06 NOTE — Progress Notes (Signed)
HEMATOLOGY/ONCOLOGY CLINIC NOTE  Date of Service: 10/11/17  PCP: [place]:  F/u for continued management of ITP  HISTORY OF PRESENTING ILLNESS:   Kiara Barrera is a wonderful 42 y.o. female who has been [place] to [place] by [facility] for evaluation and management of Chronic ITP.  Patient was been managed for her chronic ITP by Dr Yvone Neu at [facility] in [place], [place] and recently moved to [place] and wants to establish care here.  As per review of outside records the patient was diagnosed with ITP since 2012 in [place] and has been treated with prednisone on off since then. She apparently had significant weight gain with prednisone and had chronically low platelets despite the prednisone. She was then started on Promacta 50 mg by mouth daily in June 2016 and had a very good response with platelet counts of more than 500k. Her Promacta was discontinued in July 2016. She had relapse of her ITP in September 2016 with platelet count was down to 49k any significant bleeding or bruising. He has been on variable doses of Promacta since then and reports that she is currently taking 25 mg on Monday Wednesday and Friday.  He has had previous iron deficiency due to heavy periods which was treated with oral iron replacement.  INTERVAL HISTORY   Kiara Barrera is here for followup of her chronic ITP. She presents to the clinic today noting she is doing great. She is currently doing [employer] since leaving her previous job. She notes she has gained a lot of weight and wonders if this is related to water retention. Her TSH was last checked over a month and her dosages was lowered to 88 micrograms. She note she is still taking her oral iron. She notes she has swelling as the day go. She notes her menstrual cycle has changed some recently as  well. She notes she has not been getting as many steps in anymore. Her PCP is Dr. Jackson Latino.   On review of symptoms, pt note weight gain and b/l leg swelling. She notes numbness and tingling in her whole right arm and tightness across upper right chest. This comes and goes but lasts for some time. She denies swelling or pain or erythema in these areas. She denies pain in her neck. 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    MEDICAL HISTORY:  #1 chronic ITP #2 Iron deficiency anemia due to heavy periods #3 hypothyroidism #4 degenerative arthritis #5 anxiety disorder #6 history of pulmonary embolism in 2004 status post IVC filter placement. 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 #7 migraine headaches  SURGICAL HISTORY: Past Surgical History:  Procedure Laterality Date  . C secton  2002  . IVC FILTER PLACEMENT ([place] HX)  2004  . Left axillary lymph node excision biopsy  2008    SOCIAL HISTORY: Social History   Socioeconomic History  . Marital status: Single    Spouse name: Not on file  . Number of children: Not on file  . Years of education: Not on file  . Highest education level: Not on file  Occupational History  . Not on file  Social Needs  . Financial resource strain: Not on file  . Food  insecurity:    Worry: Not on file    Inability: Not on file  . Transportation needs:    Medical: Not on file    Non-medical: Not on file  Tobacco Use  . Smoking status: Never Smoker  . Smokeless tobacco: Never Used  Substance and Sexual Activity  . Alcohol use: Yes    Comment: occasional: not that often  . Drug use: No  . Sexual activity: Not on file  Lifestyle  . Physical activity:    Days per week: Not on file    Minutes per session: Not on file  . Stress: Not on file  Relationships  . Social connections:    Talks on phone: Not on file    Gets  together: Not on file    Attends religious service: Not on file    Active member of club or organization: Not on file    Attends meetings of clubs or organizations: Not on file    Relationship status: Not on file  . Intimate partner violence:    Fear of current or ex partner: Not on file    Emotionally abused: Not on file    Physically abused: Not on file    Forced sexual activity: Not on file  Other Topics Concern  . Not on file  Social History Narrative  . Not on file  Recently moved from [place], [place] to [place]. Nonsmoker minimal social alcohol use no drug use Currently unemployed Has a 82 year old daughter  FAMILY HISTORY: No known history of blood disorders Mother had a history of cervical cancer  maternal grandmother diabetes type 2 Maternal uncle diabetes type 2  ALLERGIES:  is allergic to dextromethorphan and doxylamine succinate (sleep).  MEDICATIONS:  Current Outpatient Medications  Medication Sig Dispense Refill  . Biotin 1000 MCG tablet Take 1,000 mcg by mouth daily.    . Calcium Carbonate-Vitamin D (CALCIUM 600/VITAMIN D PO) Take by mouth.    . Cyanocobalamin (B-12) 1000 MCG SUBL Place 1,000 mcg under the tongue daily. 30 each 3  . eltrombopag (PROMACTA) 25 MG tablet Take 1 tablet (25 mg total) by mouth daily. Take on an empty stomach 1 hour before a meal or 2 hours after 30 tablet 2  . iron polysaccharides (NIFEREX) 150 MG capsule Take 1 capsule (150 mg total) by mouth 2 (two) times daily. (Patient taking differently: Take 150 mg by mouth daily. )    . levothyroxine (SYNTHROID, LEVOTHROID) 100 MCG tablet Take 1 tablet (100 mcg total) by mouth daily before breakfast. 30 tablet 0  . Multiple Vitamin (MULTIVITAMIN) tablet Take 1 tablet by mouth daily.     No current facility-administered medications for this visit.     REVIEW OF SYSTEMS:    .10 Point review of Systems was done is negative except as noted above.  PHYSICAL EXAMINATION: ECOG PERFORMANCE  STATUS: 1 - Symptomatic but completely ambulatory  . Vitals:   10/11/17 0908  BP: 124/73  Pulse: 74  Resp: 18  Temp: 98.2 F (36.8 C)  SpO2: 100%   Filed Weights   10/11/17 0908  Weight: (!) 330 lb (149.7 kg)   .Body mass index is 60.36 kg/m.   Marland Kitchen GENERAL:alert, in no acute distress and comfortable SKIN: no acute rashes, no significant lesions EYES: conjunctiva are pink and non-injected, sclera anicteric OROPHARYNX: MMM, no exudates, no oropharyngeal erythema or ulceration NECK: supple, no JVD LYMPH:  no palpable lymphadenopathy in the cervical, axillary or inguinal regions LUNGS: clear to auscultation b/l with normal  respiratory effort HEART: regular rate & rhythm ABDOMEN:  normoactive bowel sounds , non tender, not distended. Extremity: no pedal edema PSYCH: alert & oriented x 3 with fluent speech NEURO: no focal motor/sensory deficits  LABORATORY DATA:  I have reviewed the data as listed  . CBC Latest Ref Rng & Units 10/11/2017 09/13/2017 08/16/2017  WBC 3.9 - 10.3 K/uL 5.7 5.3 5.2  Hemoglobin 11.6 - 15.9 g/dL - 12.3 -  Hematocrit 34.8 - 46.6 % 35.5 37.3 36.0  Platelets 145 - 400 K/uL 128(L) 94(L) 71(L)  HGB 11.7  . CMP Latest Ref Rng & Units 10/11/2017 08/16/2017 05/13/2017  Glucose 70 - 140 mg/dL 93 110 88  BUN 7 - 26 mg/dL 7 11 10.9  Creatinine 0.60 - 1.10 mg/dL 0.76 0.75 0.8  Sodium 136 - 145 mmol/L 140 140 140  Potassium 3.5 - 5.1 mmol/L 4.3 4.2 4.2  Chloride 98 - 109 mmol/L 106 106 -  CO2 22 - 29 mmol/L 27 28 26   Calcium 8.4 - 10.4 mg/dL 8.8 8.6 9.0  Total Protein 6.4 - 8.3 g/dL 6.9 6.9 7.4  Total Bilirubin 0.2 - 1.2 mg/dL 0.3 0.4 0.32  Alkaline Phos 40 - 150 U/L 82 81 84  AST 5 - 34 U/L 16 15 20   ALT 0 - 55 U/L 16 15 17    . Lab Results  Component Value Date   IRON 49 08/16/2017   TIBC 376 08/16/2017   IRONPCTSAT 13 (L) 08/16/2017   (Iron and TIBC)  Lab Results  Component Value Date   FERRITIN 41 08/16/2017      RADIOGRAPHIC STUDIES: I  have personally reviewed the radiological images as listed and agreed with the findings in the report. No results found.  ASSESSMENT & PLAN:   42 year old African-American female with   1) Chronic ITP since 2012 now with acute relapse of her ITP with very labile platelet counts No overt bleeding at this time. Patient has been on and off steroids and has been on Promacta since 2016. Was apparently on Promacta 25mg  po MWF. No issues with bleeding at this time. No petechiae B12 wnl. Ultrasound abdomen 06/02/2016- did show some mild splenomegaly which could be an additional factor in her thrombocytopenia. The spleen measures 12.9 x 13.9 x 6.7 cm. The calculated volume is 628 cc. Unclear etiology of splenomegaly. Liver appeared normal.  Patient's platelet counts have normalized after treatment with Rituxan weekly 4 doses with platelets improved temporarily but she needed additional treatment.. Her platelet counts to 128K today.  Plan  -Discussed lab results with the patient and answered her questions. -At this time her platelets improved to 128K since previous viral URI. -We shall continue her Promacta at 25 mg by mouth daily and adjust the dose up as needed to maintain platelet counts more than 50k.  -If counts continue to be stable, will consider dropping Promacta on the weekends.  --absolutely avoid NSAIDS, ASA and similar products -Patient was again counseled on importance of weight loss and remaining physically active. I recommend 10,000 steps a day with a balanced healthy diet.  -Given her recent onset of neuropathy sensations in her right arm, I will do a [place] of RUE to rule out DVT. --neg for DVT -I do not have high suspicion for a clot and I strongly suggest she see her PCP for management and possible adjustment of levothyroxine as her recent weight gain may contribute to this sensation.   2) Iron deficiency Anemia - likely from heavy menstrual losses. Hemoglobin  today at  11.7 -Tolerating the po iron without any significant issues. She previously was not taking PO iron for a period of time because she states that she forgot to pick up her medication. I previously encouraged her to be more compliant with her medication to improve her iron levels to avoid IV iron. She understands and is agreeable.   Lab Results  Component Value Date   FERRITIN 41 08/16/2017  Plan -Continue oral iron replacement with iron polysaccharide 150 mg by mouth twice a day till ferritin close to 100 -recommended she maintain compliance with this.  3)  Hypothyroidism - on levothyroxine As per primary care physician -Levothyroxine recently reduced to 0.88 micrograms, pt has gained weight since change.   4) . Patient Active Problem List   Diagnosis Date Noted  . Encntr for gyn exam (general) (routine) w/o abn findings 10/13/2016  . Exposure to STD 10/13/2016  . Chronic ITP (idiopathic thrombocytopenia) (HCC) 05/03/2016  . Thrombocytopenia ([place]) 04/19/2016  . Iron deficiency anemia 04/19/2016  . Hypothyroidism 04/19/2016  . Degenerative arthritis 04/19/2016  . Generalized anxiety disorder 04/19/2016  -continue f/u with PCP  5) Obesity  -discussed lifestyle modifications including need for appropriate diet and exercise. Encouraged her to take at least 10,000 steps a day and have a balanced healthy diet.  6) H/o PE in 2004-- still has IVC filter in situ. Thought to be triggered by immobility and obesity. -not on anticoagulation at this time.   [place] RUE today to r/o DVT RTC with Dr Irene Limbo with labs in 2 months   All of the patients questions were answered with apparent satisfaction. The patient knows to call the clinic with any problems, questions or concerns.  . The total time spent in the appointment was 20 minutes and more than 50% was on counseling and direct patient cares.    Sullivan Lone MD [place] AAHIVMS [facility] Hematology/Oncology Physician [place]  (Office):       [phone] (Work cell):  [phone] (Fax):           [phone]  This document serves as a record of services personally performed by Sullivan Lone, MD. It was created on his behalf by Joslyn Devon, a trained medical scribe. The creation of this record is based on the scribe's personal observations and the provider's statements to them.    .I have reviewed the above documentation for accuracy and completeness, and I agree with the above. Brunetta Genera MD [place]

## 2017-10-11 ENCOUNTER — Telehealth: Payer: Self-pay

## 2017-10-11 ENCOUNTER — Ambulatory Visit (HOSPITAL_COMMUNITY)
Admission: RE | Admit: 2017-10-11 | Discharge: 2017-10-11 | Disposition: A | Discharge status: Home / Self Care | Payer: Medicaid Other | Source: Ambulatory Visit | Attending: Hematology | Admitting: Hematology

## 2017-10-11 ENCOUNTER — Encounter: Payer: Self-pay | Admitting: Hematology

## 2017-10-11 ENCOUNTER — Inpatient Hospital Stay: Payer: Medicaid Other | Attending: Hematology | Admitting: Hematology

## 2017-10-11 ENCOUNTER — Inpatient Hospital Stay: Payer: Medicaid Other

## 2017-10-11 VITALS — BP 124/73 | HR 74 | Temp 98.2°F | Resp 18 | Ht 62.0 in | Wt 330.0 lb

## 2017-10-11 DIAGNOSIS — D693 Immune thrombocytopenic purpura: Secondary | ICD-10-CM | POA: Diagnosis not present

## 2017-10-11 DIAGNOSIS — F419 Anxiety disorder, unspecified: Secondary | ICD-10-CM | POA: Insufficient documentation

## 2017-10-11 DIAGNOSIS — Z86711 Personal history of pulmonary embolism: Secondary | ICD-10-CM | POA: Insufficient documentation

## 2017-10-11 DIAGNOSIS — D509 Iron deficiency anemia, unspecified: Secondary | ICD-10-CM | POA: Diagnosis not present

## 2017-10-11 DIAGNOSIS — R200 Anesthesia of skin: Secondary | ICD-10-CM

## 2017-10-11 DIAGNOSIS — E669 Obesity, unspecified: Secondary | ICD-10-CM | POA: Insufficient documentation

## 2017-10-11 DIAGNOSIS — R202 Paresthesia of skin: Secondary | ICD-10-CM | POA: Diagnosis not present

## 2017-10-11 DIAGNOSIS — E039 Hypothyroidism, unspecified: Secondary | ICD-10-CM | POA: Insufficient documentation

## 2017-10-11 DIAGNOSIS — D508 Other iron deficiency anemias: Secondary | ICD-10-CM

## 2017-10-11 DIAGNOSIS — N920 Excessive and frequent menstruation with regular cycle: Secondary | ICD-10-CM

## 2017-10-11 LAB — CBC WITH DIFFERENTIAL (CANCER CENTER ONLY)
BASOS ABS: 0 10*3/uL (ref 0.0–0.1)
Basophils Relative: 0 %
EOS PCT: 2 %
Eosinophils Absolute: 0.1 10*3/uL (ref 0.0–0.5)
HCT: 35.5 % (ref 34.8–46.6)
Hemoglobin: 11.7 g/dL (ref 11.6–15.9)
Lymphocytes Relative: 20 %
Lymphs Abs: 1.1 10*3/uL (ref 0.9–3.3)
MCH: 24.8 pg — AB (ref 25.1–34.0)
MCHC: 33 g/dL (ref 31.5–36.0)
MCV: 75.2 fL — ABNORMAL LOW (ref 79.5–101.0)
MONO ABS: 0.7 10*3/uL (ref 0.1–0.9)
Monocytes Relative: 13 %
Neutro Abs: 3.8 10*3/uL (ref 1.5–6.5)
Neutrophils Relative %: 65 %
PLATELETS: 128 10*3/uL — AB (ref 145–400)
RBC: 4.72 MIL/uL (ref 3.70–5.45)
RDW: 14.6 % — AB (ref 11.2–14.5)
WBC Count: 5.7 10*3/uL (ref 3.9–10.3)

## 2017-10-11 LAB — CMP (CANCER CENTER ONLY)
ALBUMIN: 3.2 g/dL — AB (ref 3.5–5.0)
ALK PHOS: 82 U/L (ref 40–150)
ALT: 16 U/L (ref 0–55)
AST: 16 U/L (ref 5–34)
Anion gap: 7 (ref 3–11)
BILIRUBIN TOTAL: 0.3 mg/dL (ref 0.2–1.2)
BUN: 7 mg/dL (ref 7–26)
CO2: 27 mmol/L (ref 22–29)
Calcium: 8.8 mg/dL (ref 8.4–10.4)
Chloride: 106 mmol/L (ref 98–109)
Creatinine: 0.76 mg/dL (ref 0.60–1.10)
GFR, Est AFR Am: >60 mL/min (ref >60)
GFR, Estimated: >60 mL/min (ref >60)
GLUCOSE: 93 mg/dL (ref 70–140)
POTASSIUM: 4.3 mmol/L (ref 3.5–5.1)
Sodium: 140 mmol/L (ref 136–145)
TOTAL PROTEIN: 6.9 g/dL (ref 6.4–8.3)

## 2017-10-11 LAB — RETICULOCYTES
RBC.: 4.72 MIL/uL (ref 3.70–5.45)
RETIC COUNT ABSOLUTE: 51.9 10*3/uL (ref 33.7–90.7)
Retic Ct Pct: 1.1 % (ref 0.7–2.1)

## 2017-10-11 NOTE — Telephone Encounter (Signed)
Printed avs and calender of upcoming appointment. Per 3/26 los 

## 2017-10-11 NOTE — Progress Notes (Signed)
Right upper extremity venous duplex has been completed. Negative for DVT. Results were faxed to Dr. Irene Limbo.  10/11/17 1:55 PM [place] Barrera RVT

## 2017-10-13 ENCOUNTER — Telehealth: Payer: Self-pay

## 2017-10-13 NOTE — Telephone Encounter (Signed)
Pt notified us negative for DVT in upper extremity. Pt to f/u with PCP for imaging of C spine. Pt verbalized understanding and f/u already scheduled for next week.

## 2017-10-13 NOTE — Progress Notes (Signed)
Samantha, Plz let patient know that her [place] of upper extremity was neg for DVT. F/u with PCP for further w/u of Rt UE numbness/tingling and consideration of imaging of C spine for DJD/DDD. thx GK

## 2017-10-17 ENCOUNTER — Ambulatory Visit: Payer: Medicaid Other | Admitting: Gastroenterology

## 2017-10-21 ENCOUNTER — Ambulatory Visit
Admission: RE | Admit: 2017-10-21 | Discharge: 2017-10-21 | Disposition: A | Discharge status: Home / Self Care | Payer: Medicaid Other | Source: Ambulatory Visit | Attending: Family Medicine | Admitting: Family Medicine

## 2017-10-21 ENCOUNTER — Other Ambulatory Visit: Payer: Self-pay | Admitting: Family Medicine

## 2017-10-21 DIAGNOSIS — M542 Cervicalgia: Secondary | ICD-10-CM

## 2017-10-21 DIAGNOSIS — G8929 Other chronic pain: Secondary | ICD-10-CM

## 2017-10-21 MED FILL — PROMACTA 25 MG TABLET: 25 | 30 days supply | Qty: 30 | Fill #2

## 2017-10-31 ENCOUNTER — Other Ambulatory Visit: Payer: Self-pay | Admitting: Pharmacist

## 2017-11-28 ENCOUNTER — Other Ambulatory Visit: Payer: Self-pay | Admitting: Hematology

## 2017-11-29 MED FILL — PROMACTA 25 MG TABLET: 25 | 30 days supply | Qty: 30 | Fill #0

## 2017-12-05 NOTE — Progress Notes (Signed)
HEMATOLOGY/ONCOLOGY CLINIC NOTE  Date of Service: 12/06/17  PCP: [place]:  F/u for continued management of ITP  HISTORY OF PRESENTING ILLNESS:   Kiara Barrera is a wonderful 42 y.o. 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  Patient was been managed for her chronic ITP by Dr Yvone Neu at [facility] in [address], [place] and recently moved to [place] and wants to establish care here.  As per review of outside records the patient was diagnosed with ITP since 2012 in [place] and has been treated with prednisone on off since then. She apparently had significant weight gain with prednisone and had chronically low platelets despite the prednisone. She was then started on Promacta 50 mg by mouth daily in June 2016 and had a very good response with platelet counts of more than 500k. Her Promacta was discontinued in July 2016. She had relapse of her ITP in September 2016 with platelet count was down to 49k any significant bleeding or bruising. He has been on variable doses of Promacta since then and reports that she is currently taking 25 mg on Monday Wednesday and Friday.  He has had previous iron deficiency due to heavy periods which was treated with oral iron replacement.  INTERVAL HISTORY   Kiara Barrera is here for followup of her chronic ITP. She presents to the clinic today noting she is doing great. She noted she has been able to be outside almost everyday. She left [employer] and now she works for [employer]. She notes she likes this as she gets to get out and talk to people. She notes she feels happier now.  She notes her periods come on 1-2 days earlier but stay on for 6-7 days. She has not been taking her iron pills like she should. She notes she feels her levels are getting  lower.    MEDICAL HISTORY:  #1 chronic ITP #2 Iron deficiency anemia due to heavy periods #3 hypothyroidism #4 degenerative arthritis #5 anxiety disorder #6 history of pulmonary embolism in 2004 status post IVC filter placement. 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 #7 migraine headaches  SURGICAL HISTORY: Past Surgical History:  Procedure Laterality Date  . C secton  2002  . IVC FILTER PLACEMENT ([facility] HX)  2004  . Left axillary lymph node excision biopsy  2008    SOCIAL HISTORY: Social History   Socioeconomic History  . Marital status: Single    Spouse name: Not on file  . Number of children: Not on file  . Years of education: Not on file  . Highest education level: Not on file  Occupational History  . Not on file  Social Needs  . Financial resource strain: Not on file  . Food insecurity:    Worry: Not on file    Inability: Not on file  . Transportation needs:    Medical: Not on file    Non-medical: Not on file  Tobacco Use  . Smoking status: Never Smoker  . Smokeless tobacco: Never Used  Substance and Sexual Activity  . Alcohol use: Yes    Comment: occasional: not that often  . Drug use: No  . Sexual activity: Not on file  Lifestyle  . Physical activity:    Days per week: Not on file    Minutes per session: Not on file  . Stress: Not on file  Relationships  . Social connections:    Talks on phone: Not on file    Gets together: Not on file    Attends religious service: Not on file    Active member of club or organization: Not on file    Attends meetings of clubs or organizations: Not on file    Relationship status: Not on file  . Intimate partner violence:    Fear of current or ex partner: Not on file    Emotionally abused: Not on file    Physically abused: Not on file    Forced sexual activity: Not on file  Other Topics Concern  . Not on file    Social History Narrative  . Not on file  Recently moved from [place], [place] to [place]. Nonsmoker minimal social alcohol use no drug use Currently unemployed Has a 25 year old daughter  FAMILY HISTORY: No known history of blood disorders Mother had a history of cervical cancer  maternal grandmother diabetes type 2 Maternal uncle diabetes type 2  ALLERGIES:  is allergic to dextromethorphan and doxylamine succinate (sleep).  MEDICATIONS:  Current Outpatient Medications  Medication Sig Dispense Refill  . Biotin 1000 MCG tablet Take 1,000 mcg by mouth daily.    . Calcium Carbonate-Vitamin D (CALCIUM 600/VITAMIN D PO) Take by mouth.    . Cyanocobalamin (B-12) 1000 MCG SUBL Place 1,000 mcg under the tongue daily. 30 each 3  . iron polysaccharides (NIFEREX) 150 MG capsule Take 1 capsule (150 mg total) by mouth 2 (two) times daily. (Patient taking differently: Take 150 mg by mouth daily. )    . levothyroxine (SYNTHROID, LEVOTHROID) 100 MCG tablet Take 1 tablet (100 mcg total) by mouth daily before breakfast. 30 tablet 0  . Multiple Vitamin (MULTIVITAMIN) tablet Take 1 tablet by mouth daily.    Marland Kitchen PROMACTA 25 MG tablet TAKE 1 TABLET (25 MG TOTAL) BY MOUTH DAILY. TAKE ON AN EMPTY STOMACH 1 HOUR BEFORE A MEAL OR 2 HOURS AFTER 30 tablet 2   No current facility-administered medications for this visit.     REVIEW OF SYSTEMS:   .10 Point review of Systems was done is negative except as noted above.  PHYSICAL EXAMINATION: ECOG PERFORMANCE STATUS: 1 - Symptomatic but completely ambulatory  Vitals:   12/06/17 1003  BP: (!) 161/95  Pulse: 76  Resp: 18  Temp: 98.3 F (36.8 C)  SpO2: 100%   Filed Weights   12/06/17 1003  Weight: (!) 327 lb 3.2 oz (148.4 kg)   .Body mass index is 59.85 kg/m.  Marland Kitchen GENERAL:alert, in no acute distress and comfortable SKIN: no acute rashes, no significant lesions EYES: conjunctiva are pink and non-injected, sclera anicteric OROPHARYNX: MMM, no  exudates, no oropharyngeal erythema or ulceration NECK: supple, no JVD LYMPH:  no palpable lymphadenopathy in the cervical, axillary or inguinal regions LUNGS: clear to auscultation b/l with normal respiratory effort HEART: regular rate & rhythm ABDOMEN:  normoactive bowel sounds , non tender, not distended. Extremity: no pedal edema PSYCH: alert & oriented x 3 with fluent speech NEURO: no focal motor/sensory deficits   LABORATORY DATA:  I have reviewed the data as listed  . CBC Latest Ref Rng & Units 12/06/2017 10/11/2017 09/13/2017  WBC 3.9 - 10.3 K/uL 5.1 5.7 5.3  Hemoglobin 11.6 - 15.9 g/dL 11.5(L) 11.7 12.3  Hematocrit 34.8 -  46.6 % 35.4 35.5 37.3  Platelets 145 - 400 K/uL 140(L) 128(L) 94(L)    CMP Latest Ref Rng & Units 12/06/2017 10/11/2017 08/16/2017  Glucose 70 - 140 mg/dL 141(H) 93 110  BUN 7 - 26 mg/dL 7 7 11   Creatinine 0.60 - 1.10 mg/dL 0.79 0.76 0.75  Sodium 136 - 145 mmol/L 140 140 140  Potassium 3.5 - 5.1 mmol/L 4.0 4.3 4.2  Chloride 98 - 109 mmol/L 106 106 106  CO2 22 - 29 mmol/L 27 27 28   Calcium 8.4 - 10.4 mg/dL 8.7 8.8 8.6  Total Protein 6.4 - 8.3 g/dL 6.9 6.9 6.9  Total Bilirubin 0.2 - 1.2 mg/dL 0.3 0.3 0.4  Alkaline Phos 40 - 150 U/L 81 82 81  AST 5 - 34 U/L 15 16 15   ALT 0 - 55 U/L 15 16 15    . Lab Results  Component Value Date   IRON 46 12/06/2017   TIBC 395 12/06/2017   IRONPCTSAT 12 (L) 12/06/2017   (Iron and TIBC)  Lab Results  Component Value Date   FERRITIN 24 12/06/2017    RADIOGRAPHIC STUDIES: I have personally reviewed the radiological images as listed and agreed with the findings in the report. No results found.  ASSESSMENT & PLAN:   42 year old African-American female with   1) Chronic ITP since 2012 now with acute relapse of her ITP with very labile platelet counts No overt bleeding at this time. Patient has been on and off steroids and has been on Promacta since 2016. Was apparently on Promacta 25mg  po MWF. No issues with  bleeding at this time. No petechiae B12 wnl. Ultrasound abdomen 06/02/2016- did show some mild splenomegaly which could be an additional factor in her thrombocytopenia. The spleen measures 12.9 x 13.9 x 6.7 cm. The calculated volume is 628 cc. Unclear etiology of splenomegaly. Liver appeared normal.  Patient's platelet counts have normalized after treatment with Rituxan weekly 4 doses with platelets improved temporarily but she needed additional treatment.. Her platelet counts to 128K today.  Plan  -At this time her platelets are stable/improved to 140K. -We shall continue her Promacta at 25 mg by mouth daily and adjust the dose up as needed to maintain platelet counts more than 50k.  -If counts continue to be stable/increase further, will consider dropping Promacta on the weekends.  -Advised pt to absolutely avoid NSAIDS, ASA and similar products -Patient was again counseled on importance of weight loss and remaining physically active. I recommend 10,000 steps a day with a balanced healthy diet.    2) Iron deficiency Anemia - likely from heavy menstrual losses. Hemoglobin today at 11.5, MCV 73.4, MCH 23.9 -Tolerating the po iron without any significant issues. She previously was not taking PO iron for a period of time because she states that she forgot to pick up her medication. I previously encouraged her to be more compliant with her medication to improve her iron levels to avoid IV iron. She understands and is agreeable.   Lab Results  Component Value Date   FERRITIN 24 12/06/2017  Plan  -Continue oral iron replacement with iron polysaccharide 150 mg by mouth twice a day till ferritin close to 100 -recommended she maintain compliance with this. -Her periods/menorrhagia remain overall stable.  -I discussed she can see her GYN to discuss options to slow down or reduce her periods. She will think about this.  -She has not been taking her oral iron as prescribed. Her 12/06/17 labs show she  is microcytic.  I advised her to becoming more compliant so her levels do not drop to concerning levels. She understands.   3)  Hypothyroidism - on levothyroxine As per primary care physician -Levothyroxine recently reduced to 0.88 micrograms, pt has gained weight since change.  -continue Mx per PCP  4) . Patient Active Problem List   Diagnosis Date Noted  . Encntr for gyn exam (general) (routine) w/o abn findings 10/13/2016  . Exposure to STD 10/13/2016  . Chronic ITP (idiopathic thrombocytopenia) (HCC) 05/03/2016  . Thrombocytopenia ([facility]) 04/19/2016  . Iron deficiency anemia 04/19/2016  . Hypothyroidism 04/19/2016  . Degenerative arthritis 04/19/2016  . Generalized anxiety disorder 04/19/2016  -continue f/u with PCP  5) Obesity  -discussed lifestyle modifications including need for appropriate diet and exercise. Encouraged her to take at least 10,000 steps a day and have a balanced healthy diet.  6) H/o PE in 2004-- still has IVC filter in situ. Thought to be triggered by immobility and obesity. -not on anticoagulation at this time.   RTC with Dr Irene Limbo with labs in 2 months   All of the patients questions were answered with apparent satisfaction. The patient knows to call the clinic with any problems, questions or concerns.  . The total time spent in the appointment was 20 minutes and more than 50% was on counseling and direct patient cares.   Sullivan Lone MD [place] AAHIVMS [facility]  (Office):       [phone] (Work cell):  [phone] (Fax):           [phone]  This document serves as a record of services personally performed by Sullivan Lone, MD. It was created on his behalf by Joslyn Devon, a trained medical scribe. The creation of this record is based on the scribe's personal observations and the provider's statements to them.    .I have reviewed the above documentation for accuracy and completeness, and I agree  with the above.   Brunetta Genera MD Kiara

## 2017-12-06 ENCOUNTER — Telehealth: Payer: Self-pay | Admitting: Hematology

## 2017-12-06 ENCOUNTER — Encounter: Payer: Self-pay | Admitting: Hematology

## 2017-12-06 ENCOUNTER — Inpatient Hospital Stay: Payer: Medicaid Other

## 2017-12-06 ENCOUNTER — Inpatient Hospital Stay: Payer: Medicaid Other | Attending: Hematology | Admitting: Hematology

## 2017-12-06 VITALS — BP 161/95 | HR 76 | Temp 98.3°F | Resp 18 | Ht 62.0 in | Wt 327.2 lb

## 2017-12-06 DIAGNOSIS — E669 Obesity, unspecified: Secondary | ICD-10-CM

## 2017-12-06 DIAGNOSIS — D693 Immune thrombocytopenic purpura: Secondary | ICD-10-CM

## 2017-12-06 DIAGNOSIS — E039 Hypothyroidism, unspecified: Secondary | ICD-10-CM | POA: Diagnosis not present

## 2017-12-06 DIAGNOSIS — Z86711 Personal history of pulmonary embolism: Secondary | ICD-10-CM | POA: Diagnosis not present

## 2017-12-06 DIAGNOSIS — D509 Iron deficiency anemia, unspecified: Secondary | ICD-10-CM | POA: Diagnosis not present

## 2017-12-06 DIAGNOSIS — F411 Generalized anxiety disorder: Secondary | ICD-10-CM

## 2017-12-06 DIAGNOSIS — Z95828 Presence of other vascular implants and grafts: Secondary | ICD-10-CM

## 2017-12-06 DIAGNOSIS — N920 Excessive and frequent menstruation with regular cycle: Secondary | ICD-10-CM | POA: Diagnosis not present

## 2017-12-06 DIAGNOSIS — D508 Other iron deficiency anemias: Secondary | ICD-10-CM

## 2017-12-06 LAB — RETICULOCYTES
RBC.: 4.82 MIL/uL (ref 3.70–5.45)
RETIC CT PCT: 1 % (ref 0.7–2.1)
Retic Count, Absolute: 48.2 10*3/uL (ref 33.7–90.7)

## 2017-12-06 LAB — CBC WITH DIFFERENTIAL (CANCER CENTER ONLY)
Basophils Absolute: 0 10*3/uL (ref 0.0–0.1)
Basophils Relative: 0 %
EOS ABS: 0.1 10*3/uL (ref 0.0–0.5)
EOS PCT: 2 %
HCT: 35.4 % (ref 34.8–46.6)
Hemoglobin: 11.5 g/dL — ABNORMAL LOW (ref 11.6–15.9)
LYMPHS ABS: 0.9 10*3/uL (ref 0.9–3.3)
Lymphocytes Relative: 17 %
MCH: 23.9 pg — ABNORMAL LOW (ref 25.1–34.0)
MCHC: 32.5 g/dL (ref 31.5–36.0)
MCV: 73.4 fL — ABNORMAL LOW (ref 79.5–101.0)
MONO ABS: 0.4 10*3/uL (ref 0.1–0.9)
Monocytes Relative: 8 %
Neutro Abs: 3.7 10*3/uL (ref 1.5–6.5)
Neutrophils Relative %: 73 %
PLATELETS: 140 10*3/uL — AB (ref 145–400)
RBC: 4.82 MIL/uL (ref 3.70–5.45)
RDW: 15 % — AB (ref 11.2–14.5)
WBC: 5.1 10*3/uL (ref 3.9–10.3)

## 2017-12-06 LAB — IRON AND TIBC
Iron: 46 ug/dL (ref 41–142)
Saturation Ratios: 12 % — ABNORMAL LOW (ref 21–57)
TIBC: 395 ug/dL (ref 236–444)
UIBC: 349 ug/dL

## 2017-12-06 LAB — CMP (CANCER CENTER ONLY)
ALBUMIN: 3.3 g/dL — AB (ref 3.5–5.0)
ALT: 15 U/L (ref 0–55)
AST: 15 U/L (ref 5–34)
Alkaline Phosphatase: 81 U/L (ref 40–150)
Anion gap: 7 (ref 3–11)
BILIRUBIN TOTAL: 0.3 mg/dL (ref 0.2–1.2)
BUN: 7 mg/dL (ref 7–26)
CHLORIDE: 106 mmol/L (ref 98–109)
CO2: 27 mmol/L (ref 22–29)
CREATININE: 0.79 mg/dL (ref 0.60–1.10)
Calcium: 8.7 mg/dL (ref 8.4–10.4)
GFR, Est AFR Am: >60 mL/min (ref >60)
GFR, Estimated: >60 mL/min (ref >60)
GLUCOSE: 141 mg/dL — AB (ref 70–140)
POTASSIUM: 4 mmol/L (ref 3.5–5.1)
Sodium: 140 mmol/L (ref 136–145)
Total Protein: 6.9 g/dL (ref 6.4–8.3)

## 2017-12-06 LAB — FERRITIN: Ferritin: 24 ng/mL (ref 9–269)

## 2017-12-06 NOTE — Patient Instructions (Signed)
Thank you for choosing [facility] to provide your oncology and hematology care.  To afford each patient quality time with our providers, please arrive 30 minutes before your scheduled appointment time.  If you arrive late for your appointment, you may be asked to reschedule.  We strive to give you quality time with our providers, and arriving late affects you and other patients whose appointments are after yours.   If you are a no show for multiple scheduled visits, you may be dismissed from the clinic at the providers discretion.    Again, thank you for choosing [facility], our hope is that these requests will decrease the amount of time that you wait before being seen by our physicians.  ______________________________________________________________________  Should you have questions after your visit to the [facility], please contact our office at [phone] between the hours of 8:30 and 4:30 p.m.    Voicemails left after 4:30p.m will not be returned until the following business day.    For prescription refill requests, please have your pharmacy contact us directly.  Please also try to allow 48 hours for prescription requests.    Please contact the scheduling department for questions regarding scheduling.  For scheduling of procedures such as PET scans, CT scans, MRI, Ultrasound, etc please contact central scheduling at [phone].    Resources For Cancer Patients and Caregivers:   Oncolink.org:  A wonderful resource for patients and healthcare providers for information regarding your disease, ways to tract your treatment, what to expect, etc.     American Cancer Society:  800-227-2345  Can help patients locate various types of support and financial assistance  Cancer Care: 1-800-813-HOPE (4673) Provides financial assistance, online support groups, medication/co-pay assistance.    [place] DSS:  336-641-3447 Where to apply for food  stamps, Medicaid, and utility assistance  Medicare Rights Center: 800-333-4114 Helps people with Medicare understand their rights and benefits, navigate the Medicare system, and secure the quality healthcare they deserve  SCAT: 336-333-6589 [employer]'s shared-ride transportation service for eligible riders who have a disability that prevents them from riding the fixed route bus.    For additional information on assistance programs please contact our social worker:   Grier Hock/Abigail Elmore:  [phone]            

## 2017-12-06 NOTE — Telephone Encounter (Signed)
Scheduled appt per 5/21 los - Gave patient AVS and calender per los.

## 2017-12-13 ENCOUNTER — Encounter: Payer: Self-pay | Admitting: Gastroenterology

## 2017-12-13 ENCOUNTER — Ambulatory Visit: Payer: Medicaid Other | Admitting: Gastroenterology

## 2017-12-13 VITALS — BP 118/80 | HR 74 | Ht 62.0 in | Wt 323.5 lb

## 2017-12-13 DIAGNOSIS — Z6841 Body Mass Index (BMI) 40.0 and over, adult: Secondary | ICD-10-CM | POA: Diagnosis not present

## 2017-12-13 DIAGNOSIS — K625 Hemorrhage of anus and rectum: Secondary | ICD-10-CM | POA: Diagnosis not present

## 2017-12-13 DIAGNOSIS — R140 Abdominal distension (gaseous): Secondary | ICD-10-CM | POA: Diagnosis not present

## 2017-12-13 DIAGNOSIS — K5900 Constipation, unspecified: Secondary | ICD-10-CM | POA: Diagnosis not present

## 2017-12-13 NOTE — Patient Instructions (Signed)
You have been scheduled for a colonoscopy at [facility] on 02/07/2018 at 12:45pm, separate instructions have been given  You will need to have labs done 1 week before your colonoscopy on 01/31/2018, just come to the lab ib our basement that day at any time  If you are age 41 or older, your body mass index should be between 23-30. Your Body mass index is 59.17 kg/m. If this is out of the aforementioned range listed, please consider follow up with your Primary Care Provider.  If you are age 49 or younger, your body mass index should be between 19-25. Your Body mass index is 59.17 kg/m. If this is out of the aformentioned range listed, please consider follow up with your Primary Care Provider.

## 2017-12-13 NOTE — Progress Notes (Signed)
Kiara Barrera    [id number]    Feb 08, 1976  Primary Care Physician:Reese, Zadie Cleverly, MD  Referring Physician: Lin Landsman, [address], [place] [address]  Chief complaint: Rectal bleeding  HPI: 42 year old female with morbid obesity here with complaints of intermittent rectal bleeding for past 1 month.  She started noticing bright red blood per rectum after a bowel movement when she wipes intermittently, small-volume in the past few weeks.  It is worse when she strains or stays on the toilet for prolonged period of time.  She has intermittent constipation, bloating and excessive flatulence. Mother had ovarian cancer.  No family history of colon cancer or GI malignancy    Outpatient Encounter Medications as of 12/13/2017  Medication Sig  . Biotin 1000 MCG tablet Take 1,000 mcg by mouth daily.  . Calcium Carbonate-Vitamin D (CALCIUM 600/VITAMIN D PO) Take by mouth.  . Cyanocobalamin (B-12) 1000 MCG SUBL Place 1,000 mcg under the tongue daily.  . iron polysaccharides (NIFEREX) 150 MG capsule Take 1 capsule (150 mg total) by mouth 2 (two) times daily. (Patient taking differently: Take 150 mg by mouth daily. )  . levothyroxine (SYNTHROID, LEVOTHROID) 100 MCG tablet Take 1 tablet (100 mcg total) by mouth daily before breakfast.  . Multiple Vitamin (MULTIVITAMIN) tablet Take 1 tablet by mouth daily.  Marland Kitchen PROMACTA 25 MG tablet TAKE 1 TABLET (25 MG TOTAL) BY MOUTH DAILY. TAKE ON AN EMPTY STOMACH 1 HOUR BEFORE A MEAL OR 2 HOURS AFTER   No facility-administered encounter medications on file as of 12/13/2017.     Allergies as of 12/13/2017 - Review Complete 12/13/2017  Allergen Reaction Noted  . Dextromethorphan Itching 08/16/2017  . Doxylamine succinate (sleep) Itching 08/16/2017    Past Medical History:  Diagnosis Date  . Anemia   . Blood clots in brain   . Hypothyroidism   . Lower back pain   . Obesity     Past Surgical History:  Procedure Laterality Date    . C secton  2002  . IVC FILTER PLACEMENT ([facility] HX)  2004  . Left axillary lymph node excision biopsy  2008    Family History  Problem Relation Age of Onset  . Ovarian cancer Mother   . Kidney disease Mother   . Kidney disease Brother   . Diabetes Maternal Grandmother   . Kidney disease Maternal Grandmother     Social History   Socioeconomic History  . Marital status: Single    Spouse name: Not on file  . Number of children: Not on file  . Years of education: Not on file  . Highest education level: Not on file  Occupational History  . Not on file  Social Needs  . Financial resource strain: Not on file  . Food insecurity:    Worry: Not on file    Inability: Not on file  . Transportation needs:    Medical: Not on file    Non-medical: Not on file  Tobacco Use  . Smoking status: Never Smoker  . Smokeless tobacco: Never Used  Substance and Sexual Activity  . Alcohol use: Yes    Comment: occasional: not that often  . Drug use: No  . Sexual activity: Not on file  Lifestyle  . Physical activity:    Days per week: Not on file    Minutes per session: Not on file  . Stress: Not on file  Relationships  . Social connections:  Talks on phone: Not on file    Gets together: Not on file    Attends religious service: Not on file    Active member of club or organization: Not on file    Attends meetings of clubs or organizations: Not on file    Relationship status: Not on file  . Intimate partner violence:    Fear of current or ex partner: Not on file    Emotionally abused: Not on file    Physically abused: Not on file    Forced sexual activity: Not on file  Other Topics Concern  . Not on file  Social History Narrative  . Not on file      Review of systems: Review of Systems  Constitutional: Negative for fever and chills.  Positive for fatigue HENT: Negative.   Eyes: Negative for blurred vision.  Respiratory: Positive for cough, shortness of breath and wheezing.    Cardiovascular: Negative for chest pain and palpitations.  Gastrointestinal: as per HPI Genitourinary: Negative for dysuria, urgency, frequency and hematuria.  Musculoskeletal: Positive for myalgias, back pain and joint pain.  Skin: Negative for itching and rash.  Neurological: Negative for dizziness, tremors, focal weakness, seizures and loss of consciousness.  Positive for headache and sleeping problem Endo/Heme/Allergies: Positive for seasonal allergies.  Psychiatric/Behavioral: Negative for depression, suicidal ideas and hallucinations.  All other systems reviewed and are negative.   Physical Exam: Vitals:   12/13/17 0937  BP: 118/80  Pulse: 74   Body mass index is 59.17 kg/m. Gen:      No acute distress HEENT:  EOMI, sclera anicteric Neck:     No masses; no thyromegaly Lungs:    Clear to auscultation bilaterally; normal respiratory effort CV:         Regular rate and rhythm; no murmurs Abd:      + bowel sounds; soft, non-tender; no palpable masses, no distension Ext:    No edema; adequate peripheral perfusion Skin:      Warm and dry; no rash Neuro: alert and oriented x 3 Psych: normal mood and affect  Data Reviewed:  Reviewed labs, radiology imaging, old records and pertinent past GI work up   Assessment and Plan/Recommendations:  42 year old African-American female with morbid obesity, chronic intermittent constipation, bloating with complaints of intermittent bright red blood per rectum Most likely etiology small-volume hemorrhage from internal hemorrhoids but will need to exclude neoplastic lesion or malignancy Schedule for colonoscopy Advised patient to avoid excessive straining with bowel movement Increase dietary fiber and fluid intake  The risks and benefits as well as alternatives of endoscopic procedure(s) have been discussed and reviewed. All questions answered. The patient agrees to proceed.    Damaris Hippo , MD [phone]    CC: Lin Landsman, MD

## 2017-12-16 ENCOUNTER — Encounter: Payer: Self-pay | Admitting: Gastroenterology

## 2017-12-26 MED FILL — PROMACTA 25 MG TABLET: 25 | 30 days supply | Qty: 30 | Fill #1

## 2018-01-23 NOTE — Progress Notes (Signed)
HEMATOLOGY/ONCOLOGY CLINIC NOTE  Date of Service: 01/30/18  PCP: [place]:  F/u for continued management of ITP  HISTORY OF PRESENTING ILLNESS:   Kiara Barrera is a wonderful 42 y.o. 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  Patient was been managed for her chronic ITP by Dr Yvone Neu at [facility] in [place], [place] and recently moved to [place] and wants to establish care here.  As per review of outside records the patient was diagnosed with ITP since 2012 in [place] and has been treated with prednisone on off since then. She apparently had significant weight gain with prednisone and had chronically low platelets despite the prednisone. She was then started on Promacta 50 mg by mouth daily in June 2016 and had a very good response with platelet counts of more than 500k. Her Promacta was discontinued in July 2016. She had relapse of her ITP in September 2016 with platelet count was down to 49k any significant bleeding or bruising. He has been on variable doses of Promacta since then and reports that she is currently taking 25 mg on Monday Wednesday and Friday.  He has had previous iron deficiency due to heavy periods which was treated with oral iron replacement.  INTERVAL HISTORY   Kiara Barrera is here for evaluation and management of her chronic ITP and iron deficiency anemia. The patient's last visit with [place] was on 12/06/17. She is accompanied today by daughter. The pt reports that she is doing well overall and has no acute new issues. No bleeding or abnormal bruising. PLT stable at 147K   MEDICAL HISTORY:  #1 chronic ITP #2 Iron deficiency anemia due to heavy periods #3 hypothyroidism #4 degenerative arthritis #5 anxiety disorder #6 history of pulmonary embolism in  2004 status post IVC filter placement. 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 #7 migraine headaches  SURGICAL HISTORY: Past Surgical History:  Procedure Laterality Date  . C secton  2002  . IVC FILTER PLACEMENT ([facility] HX)  2004  . Left axillary lymph node excision biopsy  2008    SOCIAL HISTORY: Social History   Socioeconomic History  . Marital status: Single    Spouse name: Not on file  . Number of children: Not on file  . Years of education: Not on file  . Highest education level: Not on file  Occupational History  . Not on file  Social Needs  . Financial resource strain: Not on file  . Food insecurity:    Worry: Not on file    Inability: Not on file  . Transportation needs:    Medical: Not on file    Non-medical: Not on file  Tobacco Use  . Smoking status: Never Smoker  . Smokeless tobacco: Never Used  Substance and Sexual Activity  . Alcohol use: Yes    Comment: occasional: not that often  . Drug use: No  . Sexual activity: Not on file  Lifestyle  . Physical activity:    Days per week: Not on file    Minutes per session: Not on file  . Stress: Not on file  Relationships  . Social connections:    Talks on phone: Not on file  Gets together: Not on file    Attends religious service: Not on file    Active member of club or organization: Not on file    Attends meetings of clubs or organizations: Not on file    Relationship status: Not on file  . Intimate partner violence:    Fear of current or ex partner: Not on file    Emotionally abused: Not on file    Physically abused: Not on file    Forced sexual activity: Not on file  Other Topics Concern  . Not on file  Social History Narrative  . Not on file  Recently moved from [place], [place] to [place]. Nonsmoker minimal social alcohol use no drug use Currently unemployed Has a 69 year old  daughter  FAMILY HISTORY: No known history of blood disorders Mother had a history of cervical cancer  maternal grandmother diabetes type 2 Maternal uncle diabetes type 2  ALLERGIES:  is allergic to dextromethorphan and doxylamine succinate (sleep).  MEDICATIONS:  Current Outpatient Medications  Medication Sig Dispense Refill  . Biotin 1000 MCG tablet Take 1,000 mcg by mouth daily.    . Calcium Carbonate-Vitamin D (CALCIUM 600/VITAMIN D PO) Take by mouth.    . Cyanocobalamin (B-12) 1000 MCG SUBL Place 1,000 mcg under the tongue daily. 30 each 3  . iron polysaccharides (NIFEREX) 150 MG capsule Take 1 capsule (150 mg total) by mouth 2 (two) times daily. (Patient taking differently: Take 150 mg by mouth daily. )    . levothyroxine (SYNTHROID, LEVOTHROID) 100 MCG tablet Take 1 tablet (100 mcg total) by mouth daily before breakfast. 30 tablet 0  . Multiple Vitamin (MULTIVITAMIN) tablet Take 1 tablet by mouth daily.    Marland Kitchen PROMACTA 25 MG tablet TAKE 1 TABLET (25 MG TOTAL) BY MOUTH DAILY. TAKE ON AN EMPTY STOMACH 1 HOUR BEFORE A MEAL OR 2 HOURS AFTER 30 tablet 2   No current facility-administered medications for this visit.     REVIEW OF SYSTEMS:   A 10+ POINT REVIEW OF SYSTEMS WAS OBTAINED including neurology, dermatology, psychiatry, cardiac, respiratory, lymph, extremities, GI, GU, Musculoskeletal, constitutional, breasts, reproductive, HEENT.  All pertinent positives are noted in the HPI.  All others are negative.   PHYSICAL EXAMINATION: ECOG PERFORMANCE STATUS: 1 - Symptomatic but completely ambulatory  Vitals:   02/06/18 0834  BP: 122/76  Pulse: 74  Resp: 18  Temp: 97.8 F (36.6 C)  SpO2: 100%   Filed Weights   02/06/18 0834  Weight: (!) 325 lb 8 oz (147.6 kg)   .Body mass index is 59.53 kg/m.   GENERAL:alert, in no acute distress and comfortable SKIN: no acute rashes, no significant lesions EYES: conjunctiva are pink and non-injected, sclera anicteric OROPHARYNX: MMM,  no exudates, no oropharyngeal erythema or ulceration NECK: supple, no JVD LYMPH:  no palpable lymphadenopathy in the cervical, axillary or inguinal regions LUNGS: clear to auscultation b/l with normal respiratory effort HEART: regular rate & rhythm ABDOMEN:  normoactive bowel sounds , non tender, not distended. No palpable hepatosplenomegaly.  Extremity: no pedal edema PSYCH: alert & oriented x 3 with fluent speech NEURO: no focal motor/sensory deficits    LABORATORY DATA:  I have reviewed the data as listed  . CBC Latest Ref Rng & Units 02/06/2018 12/06/2017 10/11/2017  WBC 3.9 - 10.3 K/uL 5.4 5.1 5.7  Hemoglobin 11.6 - 15.9 g/dL 11.2(L) 11.5(L) 11.7  Hematocrit 34.8 - 46.6 % 35.2 35.4 35.5  Platelets 145 - 400 K/uL 147 140(L) 128(L)    CMP  Latest Ref Rng & Units 02/06/2018 12/06/2017 10/11/2017  Glucose 70 - 99 mg/dL 94 141(H) 93  BUN 6 - 20 mg/dL 11 7 7   Creatinine 0.44 - 1.00 mg/dL 0.76 0.79 0.76  Sodium 135 - 145 mmol/L 138 140 140  Potassium 3.5 - 5.1 mmol/L 3.9 4.0 4.3  Chloride 98 - 111 mmol/L 105 106 106  CO2 22 - 32 mmol/L 25 27 27   Calcium 8.9 - 10.3 mg/dL 9.1 8.7 8.8  Total Protein 6.5 - 8.1 g/dL 7.4 6.9 6.9  Total Bilirubin 0.3 - 1.2 mg/dL 0.3 0.3 0.3  Alkaline Phos 38 - 126 U/L 87 81 82  AST 15 - 41 U/L 17 15 16   ALT 0 - 44 U/L 15 15 16    . Lab Results  Component Value Date   IRON 46 12/06/2017   TIBC 395 12/06/2017   IRONPCTSAT 12 (L) 12/06/2017   (Iron and TIBC)  Lab Results  Component Value Date   FERRITIN 24 12/06/2017    RADIOGRAPHIC STUDIES: I have personally reviewed the radiological images as listed and agreed with the findings in the report. No results found.  ASSESSMENT & PLAN:   42 y.o. African-American female with   1) Chronic ITP since 2012 now with acute relapse of her ITP with very labile platelet counts. No overt bleeding at this time. Patient has been on and off steroids and has been on Promacta since 2016. Was apparently on  Promacta 25mg  po MWF. No issues with bleeding at this time. No petechiae B12 wnl. Ultrasound abdomen 06/02/2016- did show some mild splenomegaly which could be an additional factor in her thrombocytopenia. The spleen measures 12.9 x 13.9 x 6.7 cm. The calculated volume is 628 cc. Unclear etiology of splenomegaly. Liver appeared normal.  Patient's platelet counts have normalized after treatment with Rituxan weekly 4 doses with platelets improved temporarily but she needed additional treatment.. Her platelet counts to 147K today.  Plan  -We shall continue her Promacta at 25 mg by mouth daily and adjust the dose up as needed to maintain platelet counts more than 50k.  -If counts continue to be stable/increase further, will consider dropping Promacta on the weekends.  -Advised pt to absolutely avoid NSAIDS, ASA and similar products -Patient was again counseled on importance of weight loss and remaining physically active. I recommend 10,000 steps a day with a balanced healthy diet.   2) Iron deficiency Anemia - likely from heavy menstrual losses. -Tolerating the po iron without any significant issues. She previously was not taking PO iron for a period of time because she states that she forgot to pick up her medication. I previously encouraged her to be more compliant with her medication to improve her iron levels to avoid IV iron. She understands and is agreeable.   Lab Results  Component Value Date   FERRITIN 24 12/06/2017   Plan  -Continue oral iron replacement with iron polysaccharide 150 mg by mouth twice a day till ferritin close to 100 -recommended she maintain compliance with this. -Her periods/menorrhagia remain overall stable.   3)  Hypothyroidism - on levothyroxine As per primary care physician -continue Mx per PCP  4) . Patient Active Problem List   Diagnosis Date Noted  . Encntr for gyn exam (general) (routine) w/o abn findings 10/13/2016  . Exposure to STD 10/13/2016  .  Chronic ITP (idiopathic thrombocytopenia) (HCC) 05/03/2016  . Thrombocytopenia ([facility]) 04/19/2016  . Iron deficiency anemia 04/19/2016  . Hypothyroidism 04/19/2016  . Degenerative arthritis 04/19/2016  .  Generalized anxiety disorder 04/19/2016  -continue f/u with PCP  5) Obesity  -discussed lifestyle modifications including need for appropriate diet and exercise. Encouraged her to take at least 10,000 steps a day and have a balanced healthy diet.  6) H/o PE in 2004-- still has IVC filter in situ. Thought to be triggered by immobility and obesity. -not on anticoagulation at this time.  RTC with Dr Irene Limbo in 2 months with labs  All of the patients questions were answered with apparent satisfaction. The patient knows to call the clinic with any problems, questions or concerns.  The total time spent in the appt was 15 minutes and more than 50% was on counseling and direct patient cares.   Sullivan Lone MD Tok AAHIVMS [facility] Hematology/Oncology Physician [facility]  (Office):       [phone] (Work cell):  [phone] (Fax):           [phone]  .I have reviewed the above documentation for accuracy and completeness, and I agree with the above. Brunetta Genera MD

## 2018-01-24 ENCOUNTER — Other Ambulatory Visit: Payer: Self-pay

## 2018-01-24 ENCOUNTER — Telehealth: Payer: Self-pay

## 2018-01-24 NOTE — Telephone Encounter (Signed)
Her colonoscopy start time has been changed. She will need to arrive on the same day but at 9:00 am. I am sending her new written instructions because there will be some changes in the prep time on the day of her procedure.

## 2018-01-26 NOTE — Telephone Encounter (Signed)
Left information on her voicemail. 

## 2018-01-30 MED FILL — PROMACTA 25 MG TABLET: 25 | 30 days supply | Qty: 30 | Fill #2

## 2018-02-06 ENCOUNTER — Other Ambulatory Visit: Payer: Self-pay | Admitting: *Deleted

## 2018-02-06 ENCOUNTER — Telehealth: Payer: Self-pay | Admitting: Hematology

## 2018-02-06 ENCOUNTER — Inpatient Hospital Stay: Payer: Medicaid Other | Attending: Hematology | Admitting: Hematology

## 2018-02-06 ENCOUNTER — Inpatient Hospital Stay: Payer: Medicaid Other

## 2018-02-06 ENCOUNTER — Encounter: Payer: Self-pay | Admitting: Hematology

## 2018-02-06 VITALS — BP 122/76 | HR 74 | Temp 97.8°F | Resp 18 | Ht 62.0 in | Wt 325.5 lb

## 2018-02-06 DIAGNOSIS — Z8049 Family history of malignant neoplasm of other genital organs: Secondary | ICD-10-CM | POA: Diagnosis not present

## 2018-02-06 DIAGNOSIS — D693 Immune thrombocytopenic purpura: Secondary | ICD-10-CM

## 2018-02-06 DIAGNOSIS — N920 Excessive and frequent menstruation with regular cycle: Secondary | ICD-10-CM | POA: Diagnosis not present

## 2018-02-06 DIAGNOSIS — F411 Generalized anxiety disorder: Secondary | ICD-10-CM | POA: Diagnosis not present

## 2018-02-06 DIAGNOSIS — Z86711 Personal history of pulmonary embolism: Secondary | ICD-10-CM | POA: Diagnosis not present

## 2018-02-06 DIAGNOSIS — E039 Hypothyroidism, unspecified: Secondary | ICD-10-CM | POA: Diagnosis not present

## 2018-02-06 DIAGNOSIS — K625 Hemorrhage of anus and rectum: Secondary | ICD-10-CM

## 2018-02-06 DIAGNOSIS — Z95828 Presence of other vascular implants and grafts: Secondary | ICD-10-CM

## 2018-02-06 DIAGNOSIS — D509 Iron deficiency anemia, unspecified: Secondary | ICD-10-CM

## 2018-02-06 LAB — CMP (CANCER CENTER ONLY)
ALK PHOS: 87 U/L (ref 38–126)
ALT: 15 U/L (ref 0–44)
AST: 17 U/L (ref 15–41)
Albumin: 3.7 g/dL (ref 3.5–5.0)
Anion gap: 8 (ref 5–15)
BILIRUBIN TOTAL: 0.3 mg/dL (ref 0.3–1.2)
BUN: 11 mg/dL (ref 6–20)
CALCIUM: 9.1 mg/dL (ref 8.9–10.3)
CO2: 25 mmol/L (ref 22–32)
CREATININE: 0.76 mg/dL (ref 0.44–1.00)
Chloride: 105 mmol/L (ref 98–111)
GFR, Est AFR Am: >60 mL/min (ref >60)
Glucose, Bld: 94 mg/dL (ref 70–99)
POTASSIUM: 3.9 mmol/L (ref 3.5–5.1)
Sodium: 138 mmol/L (ref 135–145)
TOTAL PROTEIN: 7.4 g/dL (ref 6.5–8.1)

## 2018-02-06 LAB — CBC WITH DIFFERENTIAL/PLATELET
Basophils Absolute: 0 10*3/uL (ref 0.0–0.1)
Basophils Relative: 0 %
EOS PCT: 1 %
Eosinophils Absolute: 0.1 10*3/uL (ref 0.0–0.5)
HEMATOCRIT: 35.2 % (ref 34.8–46.6)
Hemoglobin: 11.2 g/dL — ABNORMAL LOW (ref 11.6–15.9)
LYMPHS ABS: 0.8 10*3/uL — AB (ref 0.9–3.3)
LYMPHS PCT: 15 %
MCH: 22.9 pg — AB (ref 25.1–34.0)
MCHC: 31.8 g/dL (ref 31.5–36.0)
MCV: 71.8 fL — AB (ref 79.5–101.0)
MONO ABS: 0.5 10*3/uL (ref 0.1–0.9)
Monocytes Relative: 10 %
NEUTROS ABS: 3.9 10*3/uL (ref 1.5–6.5)
Neutrophils Relative %: 74 %
PLATELETS: 147 10*3/uL (ref 145–400)
RBC: 4.9 MIL/uL (ref 3.70–5.45)
RDW: 15.4 % — AB (ref 11.2–14.5)
WBC: 5.4 10*3/uL (ref 3.9–10.3)

## 2018-02-06 NOTE — Telephone Encounter (Signed)
Gave patient avs and calendar of upcoming September appointments.  °

## 2018-02-07 ENCOUNTER — Encounter (HOSPITAL_COMMUNITY): Payer: Self-pay

## 2018-02-07 ENCOUNTER — Other Ambulatory Visit: Payer: Self-pay

## 2018-02-07 ENCOUNTER — Ambulatory Visit (HOSPITAL_COMMUNITY)
Admission: RE | Admit: 2018-02-07 | Discharge: 2018-02-07 | Disposition: A | Discharge status: Home / Self Care | Payer: Medicaid Other | Source: Ambulatory Visit | Attending: Gastroenterology | Admitting: Gastroenterology

## 2018-02-07 ENCOUNTER — Ambulatory Visit (HOSPITAL_COMMUNITY): Payer: Medicaid Other | Admitting: Anesthesiology

## 2018-02-07 ENCOUNTER — Encounter (HOSPITAL_COMMUNITY)
Admission: RE | Disposition: A | Discharge status: Home / Self Care | Payer: Self-pay | Source: Ambulatory Visit | Attending: Gastroenterology

## 2018-02-07 DIAGNOSIS — K625 Hemorrhage of anus and rectum: Secondary | ICD-10-CM | POA: Diagnosis present

## 2018-02-07 DIAGNOSIS — Z79890 Hormone replacement therapy (postmenopausal): Secondary | ICD-10-CM | POA: Insufficient documentation

## 2018-02-07 DIAGNOSIS — D124 Benign neoplasm of descending colon: Secondary | ICD-10-CM | POA: Diagnosis not present

## 2018-02-07 DIAGNOSIS — Z79899 Other long term (current) drug therapy: Secondary | ICD-10-CM | POA: Insufficient documentation

## 2018-02-07 DIAGNOSIS — Z6841 Body Mass Index (BMI) 40.0 and over, adult: Secondary | ICD-10-CM | POA: Diagnosis not present

## 2018-02-07 DIAGNOSIS — K635 Polyp of colon: Secondary | ICD-10-CM

## 2018-02-07 DIAGNOSIS — E039 Hypothyroidism, unspecified: Secondary | ICD-10-CM | POA: Diagnosis not present

## 2018-02-07 HISTORY — PX: POLYPECTOMY: SHX5525

## 2018-02-07 HISTORY — PX: COLONOSCOPY WITH PROPOFOL: SHX5780

## 2018-02-07 SURGERY — COLONOSCOPY WITH PROPOFOL
Anesthesia: Monitor Anesthesia Care

## 2018-02-07 MED ORDER — SODIUM CHLORIDE 0.9 % IV SOLN: INTRAVENOUS | Status: DC

## 2018-02-07 MED ORDER — PROPOFOL 500 MG/50ML IV EMUL
INTRAVENOUS | Status: DC | PRN
  Administered 2018-02-07: 150 ug/kg/min via INTRAVENOUS

## 2018-02-07 MED ORDER — ONDANSETRON HCL 4 MG/2ML IJ SOLN
INTRAMUSCULAR | Status: DC | PRN
  Administered 2018-02-07: 4 mg via INTRAVENOUS

## 2018-02-07 MED ORDER — LACTATED RINGERS IV SOLN
INTRAVENOUS | Status: DC
  Administered 2018-02-07: 1000 mL via INTRAVENOUS

## 2018-02-07 MED ORDER — PROPOFOL 10 MG/ML IV BOLUS
INTRAVENOUS | Status: DC | PRN
  Administered 2018-02-07: 20 mg via INTRAVENOUS

## 2018-02-07 MED ORDER — PROPOFOL 10 MG/ML IV BOLUS
INTRAVENOUS | Status: AC
  Filled 2018-02-07: qty 40

## 2018-02-07 MED ORDER — LIDOCAINE 2% (20 MG/ML) 5 ML SYRINGE
INTRAMUSCULAR | Status: DC | PRN
  Administered 2018-02-07: 80 mg via INTRAVENOUS

## 2018-02-07 MED ORDER — PROPOFOL 10 MG/ML IV BOLUS
INTRAVENOUS | Status: AC
  Filled 2018-02-07: qty 20

## 2018-02-07 SURGICAL SUPPLY — 21 items

## 2018-02-07 NOTE — Op Note (Signed)
[facility] Patient Name: Kiara Barrera Procedure Date: 02/07/2018 MRN: [id number] Attending MD: Mauri Pole , MD Date of Birth: [birth year] CSN: [id number] Age: 42 Admit Type: Outpatient Procedure:                Colonoscopy Indications:              Evaluation of unexplained GI bleeding Providers:                Mauri Pole, MD, Zenon Mayo, RN, Elspeth Cho Tech., Technician, Virgia Land, CRNA Referring MD:              Medicines:                Monitored Anesthesia Care Complications:            No immediate complications. Estimated Blood Loss:     Estimated blood loss was minimal. Procedure:                Pre-Anesthesia Assessment:                           - Prior to the procedure, a History and Physical                            was performed, and patient medications and                            allergies were reviewed. The patient's tolerance of                            previous anesthesia was also reviewed. The risks                            and benefits of the procedure and the sedation                            options and risks were discussed with the patient.                            All questions were answered, and informed consent                            was obtained. Prior Anticoagulants: The patient has                            taken no previous anticoagulant or antiplatelet                            agents. ASA Grade Assessment: III - A patient with                            severe systemic disease. After reviewing the risks  and benefits, the patient was deemed in                            satisfactory condition to undergo the procedure.                           After obtaining informed consent, the colonoscope                            was passed under direct vision. Throughout the                            procedure, the patient's blood pressure, pulse,  and                            oxygen saturations were monitored continuously. The                            CF-HQ190L ([id number] Olympus adult colonoscope was                            introduced through the anus and advanced to the the                            cecum, identified by appendiceal orifice and                            ileocecal valve. The colonoscopy was performed                            without difficulty. The patient tolerated the                            procedure well. The quality of the bowel                            preparation was excellent. The ileocecal valve,                            appendiceal orifice, and rectum were photographed. Scope In: 11:13:19 AM Scope Out: 11:28:31 AM Scope Withdrawal Time: 0 hours 11 minutes 52 seconds  Total Procedure Duration: 0 hours 15 minutes 12 seconds  Findings:      The perianal and digital rectal examinations were normal.      A 25 mm polyp was found in the descending colon. The polyp was       pedunculated. The polyp was removed with a hot snare. Resection and       retrieval were complete. To prevent bleeding after the polypectomy, one       hemostatic clip was successfully placed (MR conditional). There was no       bleeding at the end of the procedure.      The exam was otherwise normal throughout the examined colon. Impression:               - One 25 mm polyp in the  descending colon, removed                            with a hot snare. Resected and retrieved. Clip (MR                            conditional) was placed. Moderate Sedation:      N/A- Per Anesthesia Care Recommendation:           - Patient has a contact number available for                            emergencies. The signs and symptoms of potential                            delayed complications were discussed with the                            patient. Return to normal activities tomorrow.                            Written discharge  instructions were provided to the                            patient.                           - Resume previous diet.                           - Continue present medications.                           - Await pathology results.                           - Repeat colonoscopy in 3 years for surveillance                            based on pathology results. Procedure Code(s):        --- Professional ---                           [phone], Colonoscopy, flexible; with removal of                            tumor(s), polyp(s), or other lesion(s) by snare                            technique Diagnosis Code(s):        --- Professional ---                           D12.4, Benign neoplasm of descending colon                           K92.2, Gastrointestinal hemorrhage, unspecified CPT copyright 2017  American Medical Association. All rights reserved. The codes documented in this report are preliminary and upon coder review may  be revised to meet current compliance requirements. Mauri Pole, MD 02/07/2018 11:38:59 AM This report has been signed electronically. Number of Addenda: 0

## 2018-02-07 NOTE — Anesthesia Preprocedure Evaluation (Signed)
Anesthesia Evaluation  Patient identified by MRN, date of birth, ID band Patient awake    Reviewed: Allergy & Precautions, NPO status , Patient's Chart, lab work & pertinent test results  Airway Mallampati: II  TM Distance: >3 FB Neck ROM: Full    Dental no notable dental hx.    Pulmonary neg pulmonary ROS,    Pulmonary exam normal breath sounds clear to auscultation       Cardiovascular negative cardio ROS Normal cardiovascular exam Rhythm:Regular Rate:Normal     Neuro/Psych negative neurological ROS  negative psych ROS   GI/Hepatic negative GI ROS, Neg liver ROS,   Endo/Other  Hypothyroidism Morbid obesity  Renal/GU negative Renal ROS  negative genitourinary   Musculoskeletal negative musculoskeletal ROS (+)   Abdominal   Peds negative pediatric ROS (+)  Hematology negative hematology ROS (+)   Anesthesia Other Findings   Reproductive/Obstetrics negative OB ROS                             Anesthesia Physical Anesthesia Plan  ASA: III  Anesthesia Plan: MAC   Post-op Pain Management:    Induction: Intravenous  PONV Risk Score and Plan: 0  Airway Management Planned: Simple Face Mask  Additional Equipment:   Intra-op Plan:   Post-operative Plan:   Informed Consent: I have reviewed the patients History and Physical, chart, labs and discussed the procedure including the risks, benefits and alternatives for the proposed anesthesia with the patient or authorized representative who has indicated his/her understanding and acceptance.   Dental advisory given  Plan Discussed with: CRNA and Surgeon  Anesthesia Plan Comments:         Anesthesia Quick Evaluation

## 2018-02-07 NOTE — H&P (Signed)
[facility] History and Physical   Primary Care Physician:  Lin Landsman, MD   Reason for Procedure:   BRBPR Plan:   Colonoscopy     HPI: Kiara Barrera is a 42 y.o. female here for colonoscopy for evaluation of intermittent rectal bleeding. Denies any nausea, abdominal pain or vomiting. Tolerated bowel prep well.    Past Medical History:  Diagnosis Date  . Anemia   . Blood clots in brain   . Hypothyroidism   . Lower back pain   . Obesity     Past Surgical History:  Procedure Laterality Date  . C secton  2002  . IVC FILTER PLACEMENT ([place] HX)  2004  . Left axillary lymph node excision biopsy  2008    Prior to Admission medications   Medication Sig Start Date End Date Taking? Authorizing Provider  Cholecalciferol (VITAMIN D3 PO) Take 1 capsule by mouth daily.   Yes [provider]  Cyanocobalamin (B-12) 1000 MCG SUBL Place 1,000 mcg under the tongue daily. 10/11/16  Yes Brunetta Genera, MD  iron polysaccharides (NIFEREX) 150 MG capsule Take 1 capsule (150 mg total) by mouth 2 (two) times daily. 06/07/16  Yes Brunetta Genera, MD  levothyroxine (SYNTHROID, LEVOTHROID) 100 MCG tablet Take 1 tablet (100 mcg total) by mouth daily before breakfast. 05/13/17  Yes Brunetta Genera, MD  Multiple Vitamin (MULTIVITAMIN) tablet Take 1 tablet by mouth daily.   Yes [provider]  PROMACTA 25 MG tablet TAKE 1 TABLET (25 MG TOTAL) BY MOUTH DAILY. TAKE ON AN EMPTY STOMACH 1 HOUR BEFORE A MEAL OR 2 HOURS AFTER 11/28/17  Yes Brunetta Genera, MD    Current Facility-Administered Medications  Medication Dose Route Frequency Provider Last Rate Last Dose  . 0.9 %  sodium chloride infusion   Intravenous Continuous Lonya Johannesen V, MD      . lactated ringers infusion   Intravenous Continuous Mauri Pole, MD        Allergies as of 12/13/2017 - Review Complete 12/13/2017  Allergen Reaction Noted  . Dextromethorphan Itching  08/16/2017  . Doxylamine succinate (sleep) Itching 08/16/2017    Family History  Problem Relation Age of Onset  . Ovarian cancer Mother   . Kidney disease Mother   . Kidney disease Brother   . Diabetes Maternal Grandmother   . Kidney disease Maternal Grandmother     Social History   Socioeconomic History  . Marital status: Single    Spouse name: Not on file  . Number of children: Not on file  . Years of education: Not on file  . Highest education level: Not on file  Occupational History  . Not on file  Social Needs  . Financial resource strain: Not on file  . Food insecurity:    Worry: Not on file    Inability: Not on file  . Transportation needs:    Medical: Not on file    Non-medical: Not on file  Tobacco Use  . Smoking status: Never Smoker  . Smokeless tobacco: Never Used  Substance and Sexual Activity  . Alcohol use: Yes    Comment: occasional: not that often  . Drug use: No  . Sexual activity: Not on file  Lifestyle  . Physical activity:    Days per week: Not on file    Minutes per session: Not on file  . Stress: Not on file  Relationships  . Social connections:    Talks on phone: Not on file  Gets together: Not on file    Attends religious service: Not on file    Active member of club or organization: Not on file    Attends meetings of clubs or organizations: Not on file    Relationship status: Not on file  . Intimate partner violence:    Fear of current or ex partner: Not on file    Emotionally abused: Not on file    Physically abused: Not on file    Forced sexual activity: Not on file  Other Topics Concern  . Not on file  Social History Narrative  . Not on file    Review of Systems:  All other review of systems negative except as mentioned in the HPI.  Physical Exam: Vital signs in last 24 hours: Temp:  [97.9 F (36.6 C)] 97.9 F (36.6 C) (07/23 0853) Pulse Rate:  [76] 76 (07/23 0853) Resp:  [18] 18 (07/23 0853) BP: (147)/(75) 147/75  (07/23 0853) SpO2:  [99 %] 99 % (07/23 0853) Weight:  [147.4 kg (325 lb)] 147.4 kg (325 lb) (07/23 0853)   General:   Alert,  Well-developed, well-nourished, pleasant and cooperative in NAD Lungs:  Clear throughout to auscultation.   Heart:  Regular rate and rhythm; no murmurs, clicks, rubs,  or gallops. Abdomen:  Soft, nontender and nondistended. Normal bowel sounds.   Neuro/Psych:  Alert and cooperative. Normal mood and affect. A and O x 3   K. Denzil Magnuson , MD [phone]

## 2018-02-07 NOTE — Anesthesia Postprocedure Evaluation (Signed)
Anesthesia Post Note  Patient: Kiara Barrera  Procedure(s) Performed: COLONOSCOPY WITH PROPOFOL (N/A )     Patient location during evaluation: PACU Anesthesia Type: MAC Level of consciousness: awake and alert Pain management: pain level controlled Vital Signs Assessment: post-procedure vital signs reviewed and stable Respiratory status: spontaneous breathing, nonlabored ventilation, respiratory function stable and patient connected to nasal cannula oxygen Cardiovascular status: stable and blood pressure returned to baseline Postop Assessment: no apparent nausea or vomiting Anesthetic complications: no    Last Vitals:  Vitals:   02/07/18 1144 02/07/18 1150  BP:  134/85  Pulse: 80 73  Resp: (!) 27 20  Temp:    SpO2: 100% 100%    Last Pain:  Vitals:   02/07/18 1140  TempSrc:   PainSc: 0-No pain                 Telissa Palmisano S

## 2018-02-07 NOTE — Discharge Instructions (Signed)

## 2018-02-07 NOTE — Transfer of Care (Signed)
Immediate Anesthesia Transfer of Care Note  Patient: Kiara Barrera [place]  Procedure(s) Performed: COLONOSCOPY WITH PROPOFOL (N/A )  Patient Location: PACU and Endoscopy Unit  Anesthesia Type:MAC  Level of Consciousness: awake, alert , oriented and patient cooperative  Airway & Oxygen Therapy: Patient Spontanous Breathing and Patient connected to face mask oxygen  Post-op Assessment: Report given to RN and Post -op Vital signs reviewed and stable  Post vital signs: Reviewed and stable  Last Vitals:  Vitals Value Taken Time  BP    Temp    Pulse 91 02/07/2018 11:35 AM  Resp 9 02/07/2018 11:35 AM  SpO2 100 % 02/07/2018 11:35 AM  Vitals shown include unvalidated device data.  Last Pain:  Vitals:   02/07/18 0853  TempSrc: Oral  PainSc: 0-No pain         Complications: No apparent anesthesia complications

## 2018-02-08 IMAGING — US US ABDOMEN COMPLETE
1 series · 14 of 25 positions shown · non-contrast
Comparison: None in PACs

CLINICAL DATA: Severe thrombocytopenia. Patient has undergone IVC
filter placement.

EXAM:
ABDOMEN ULTRASOUND COMPLETE

[Series 1: us abdomen complete · 0.24mm/px · 14 of 115 slices shown]
[im 1/115]
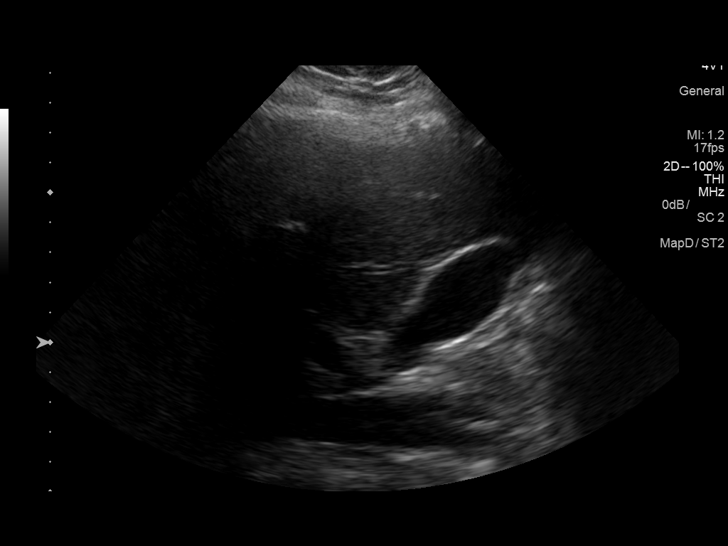
[im 10/115]
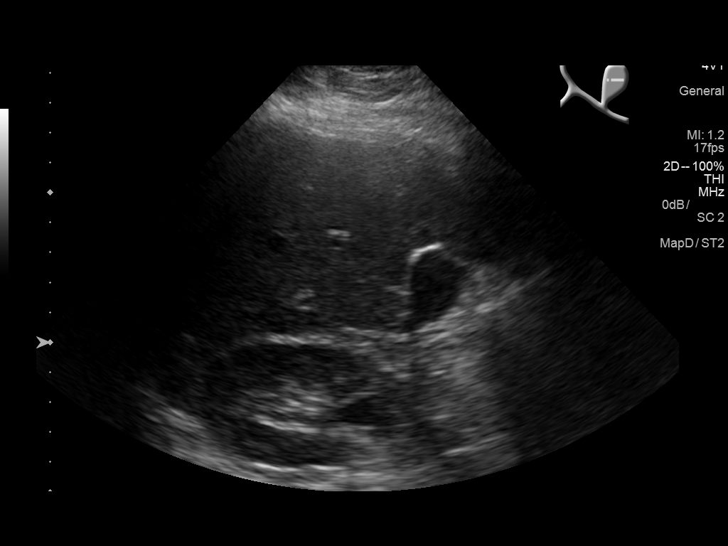
[im 20/115]
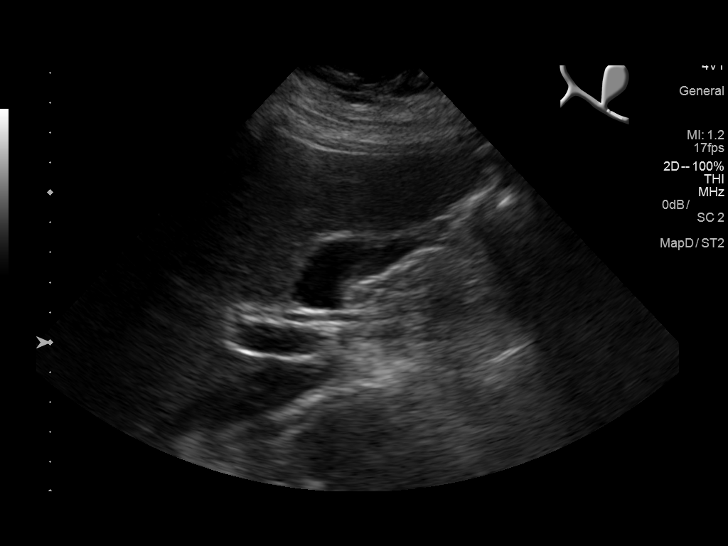
[im 29/115]
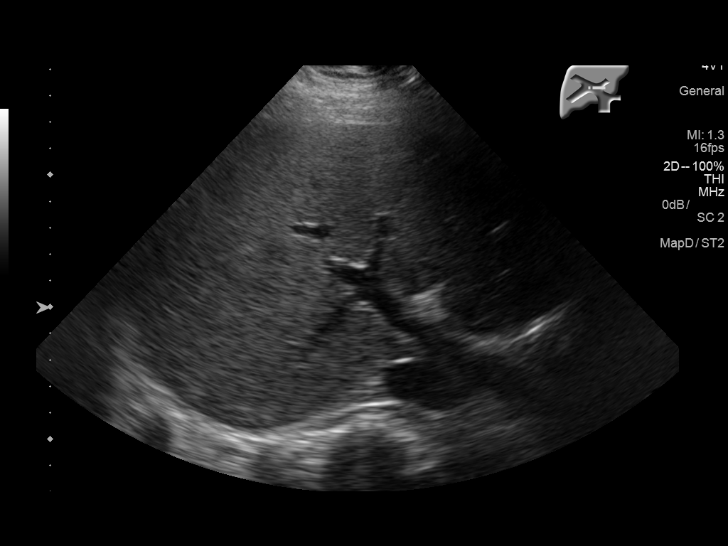
[im 39/115]
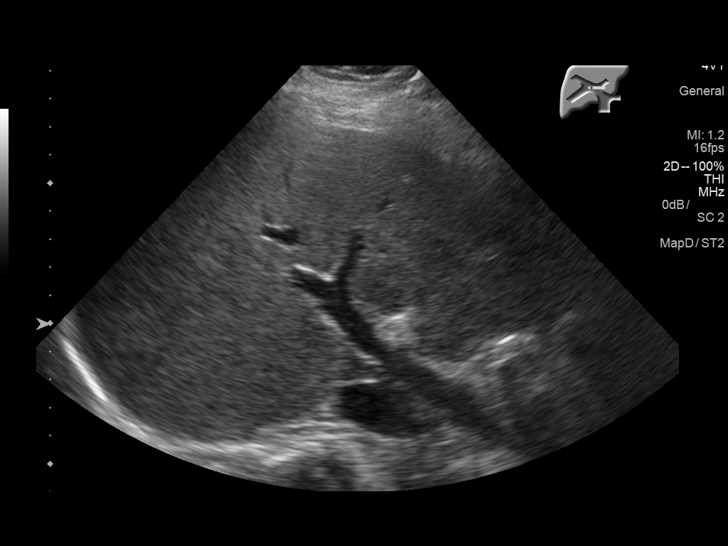
[im 43/115]
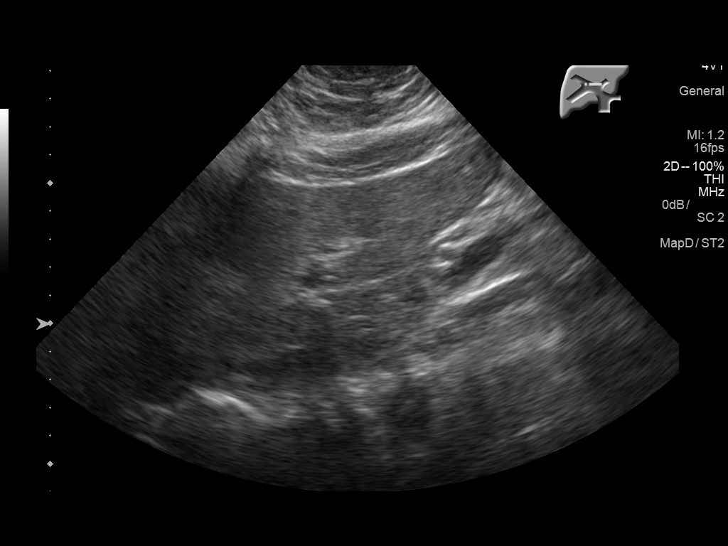
[im 53/115]
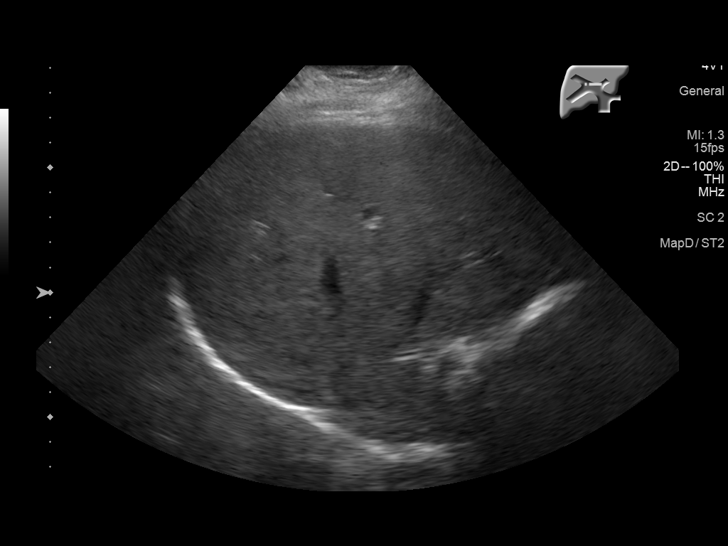
[im 62/115]
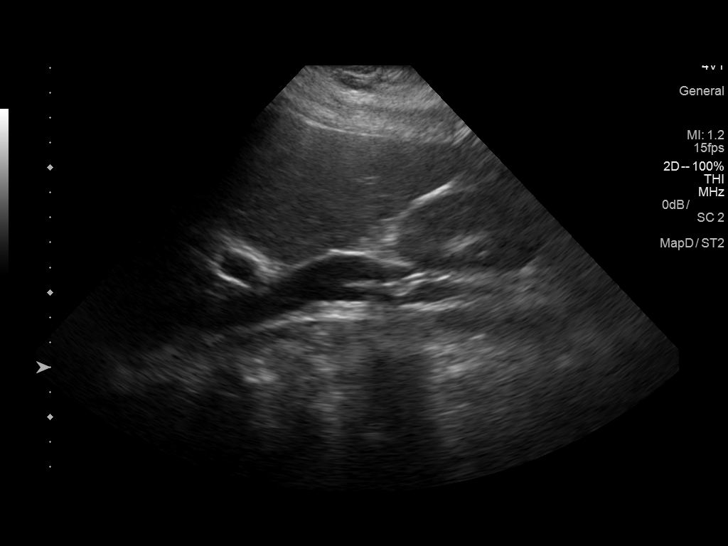
[im 72/115]
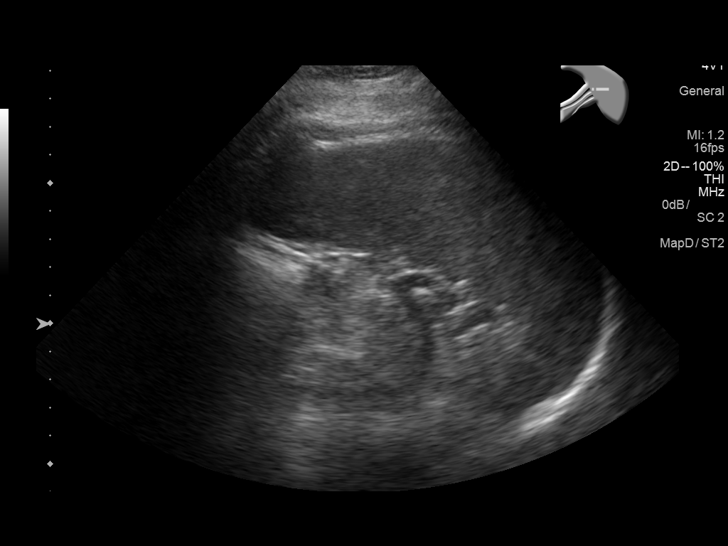
[im 77/115]
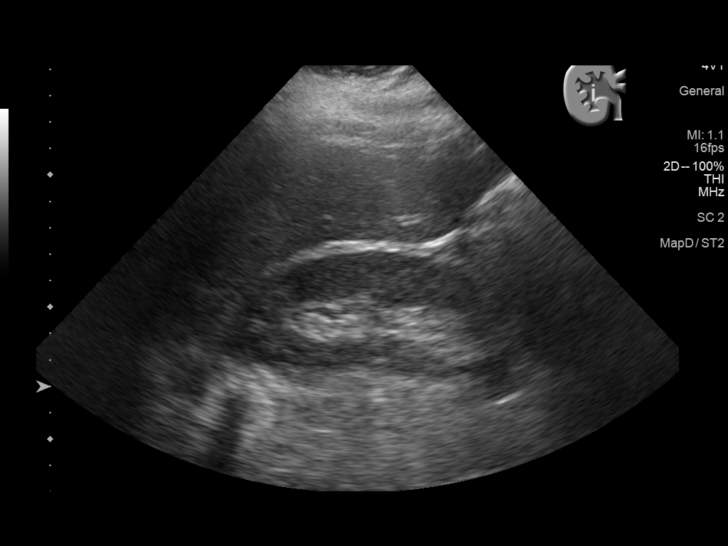
[im 86/115]
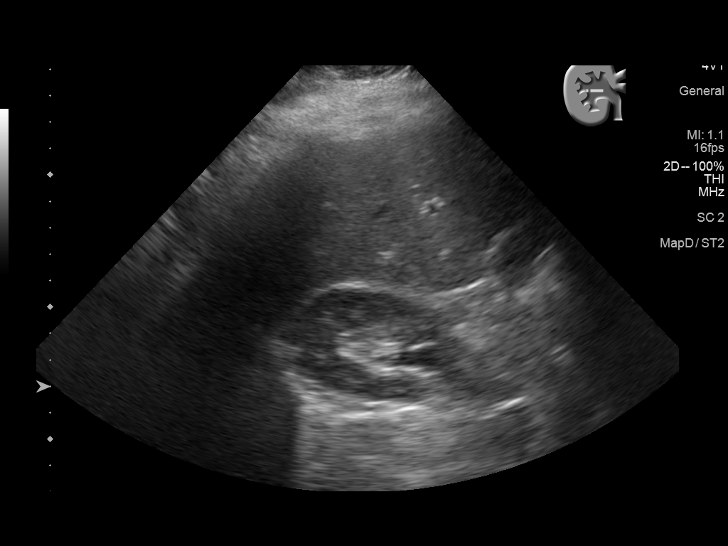
[im 96/115]
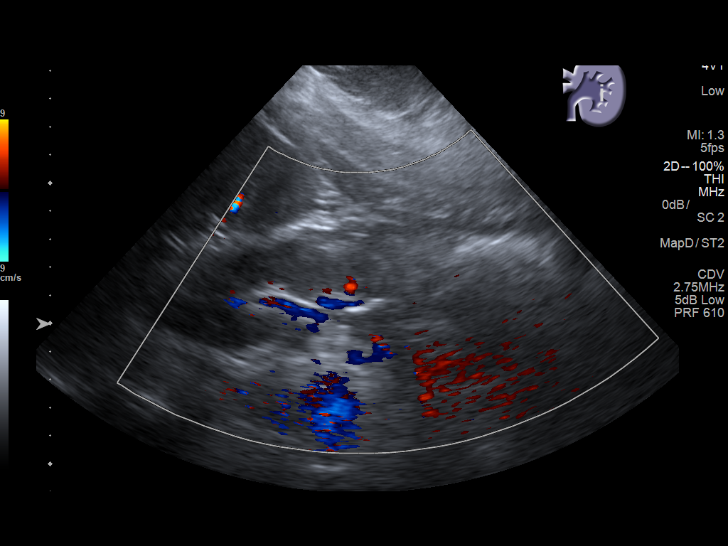
[im 105/115]
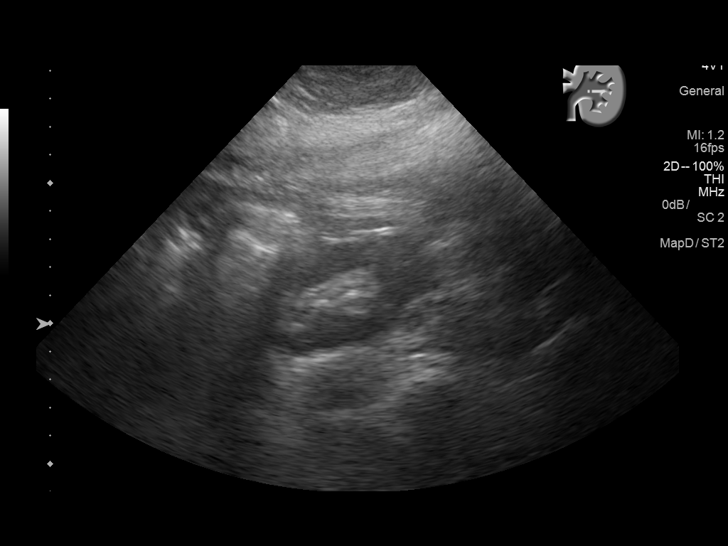
[im 115/115]
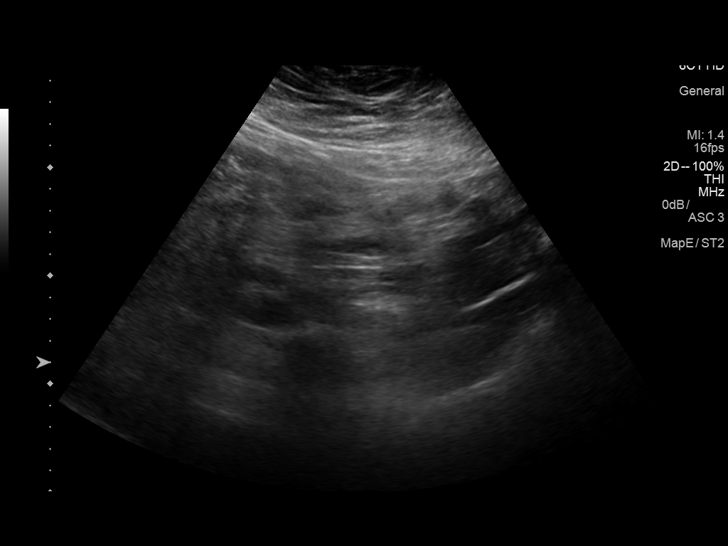

[14 of 25 positions shown; findings below may reference images not displayed]

FINDINGS: Gallbladder: No gallstones or wall thickening visualized. No
sonographic Murphy sign noted by sonographer.

Common bile duct: Diameter: 3.1 mm

Liver: The hepatic echotexture is normal. There is no focal mass or
ductal dilation.

IVC: An IVC filter is visible.

Pancreas: Visualized portion unremarkable.

Spleen: There is mild splenomegaly. The spleen measures 12.9 x
x 6.7 cm. The calculated volume is 628 cc

Right Kidney: Length: 11.0. Echogenicity within normal limits. No
mass or hydronephrosis visualized.

Left Kidney: Length: 14.1. Echogenicity within normal limits. No
mass or hydronephrosis visualized.

Abdominal aorta: No aneurysm visualized.

Other findings: There is no ascites.
IMPRESSION: Splenomegaly.  Normal appearance of the liver.

No gallstones or sonographic evidence of acute cholecystitis. Normal
appearance of the kidneys and abdominal aorta. An IVC filter is
present.

## 2018-02-09 ENCOUNTER — Encounter (HOSPITAL_COMMUNITY): Payer: Self-pay | Admitting: Gastroenterology

## 2018-02-23 ENCOUNTER — Other Ambulatory Visit: Payer: Self-pay | Admitting: Hematology

## 2018-02-27 MED FILL — PROMACTA 25 MG TABLET: 25 | 30 days supply | Qty: 30 | Fill #0

## 2018-03-02 ENCOUNTER — Encounter: Payer: Self-pay | Admitting: Gastroenterology

## 2018-03-22 ENCOUNTER — Telehealth: Payer: Self-pay | Admitting: Pharmacist

## 2018-03-22 NOTE — Telephone Encounter (Signed)
Oral Chemotherapy Pharmacist Encounter   Spoke with patient today to follow up regarding patient's oral chemotherapy medication: Promcata  Original Start date of oral chemotherapy: 02/11/17  Pt is doing well today  Pt reports 0 tablets/doses of Promacta 25mg  tablets, 1 tablet taken by mouth once daily on an empty stomach, 1 hour before or 2 hours after food, also seperated from multivitamin and iron supplement, missed in the last month.   Pt reports the following side effects: none to report  Pertinent labs reviewed: OK for continued treatment.  Other Issues: patient concerned about weight gain and admits she does not watch her calorie intake or perform any exercise activities. She also endorses constipation if she is compliant with her iron supplement. She states she takes it every once in a while, however, never takes her second daily dose. We discussed increasing daily steps, starting with a 17min walk daily until she can work up to more time. Patient states she will try.  Patient knows to call the office with questions or concerns. Oral Oncology Clinic will continue to follow.  Johny Drilling, PharmD, BCPS, BCOP  03/22/2018 12:36 PM Oral Oncology Clinic [phone]

## 2018-03-29 MED FILL — PROMACTA 25 MG TABLET: 25 | 30 days supply | Qty: 30 | Fill #1

## 2018-04-03 ENCOUNTER — Ambulatory Visit: Payer: Medicaid Other | Admitting: Hematology

## 2018-04-03 ENCOUNTER — Other Ambulatory Visit: Payer: Medicaid Other

## 2018-04-26 MED FILL — PROMACTA 25 MG TABLET: 25 | 30 days supply | Qty: 30 | Fill #2

## 2018-05-22 ENCOUNTER — Other Ambulatory Visit: Payer: Self-pay | Admitting: Hematology

## 2018-05-24 ENCOUNTER — Telehealth: Payer: Self-pay | Admitting: *Deleted

## 2018-05-24 NOTE — Telephone Encounter (Signed)
Patient contacted office after pharmacy contacted her to tell her that [place] would not be refilled. Advised patient that she had not been seen since July and had no appointment scheduled. Advised her that Dr. Irene Limbo out of office until 11/18. On call MD did not refill RX. patient states she will make appt with Dr. Irene Limbo on his return.

## 2018-05-24 NOTE — Telephone Encounter (Signed)
Contacted by Altha Harm from [facility]. Patient needs refill of Promacta. Please advise as it needs to be mailed on Friday to be received by patient on Monday. Reviewed with Dr. Alen Blew in Dr. Grier Mitts absence. Last PLT was 147 on 02/06/18. Patient has not had appointment since 7/22 and has no appointment scheduled. Dr. Alen Blew declined to refill medication. Pharmacy notified, they will contact patient.

## 2018-06-09 ENCOUNTER — Telehealth: Payer: Self-pay | Admitting: Hematology

## 2018-06-09 NOTE — Telephone Encounter (Signed)
Tried to call regarding voicemail, patient voicemail box was full could not leave a message.

## 2018-06-13 ENCOUNTER — Telehealth: Payer: Self-pay | Admitting: Hematology

## 2018-06-13 ENCOUNTER — Telehealth: Payer: Self-pay | Admitting: *Deleted

## 2018-06-13 ENCOUNTER — Other Ambulatory Visit: Payer: Self-pay | Admitting: Hematology

## 2018-06-13 MED ORDER — ELTROMBOPAG OLAMINE 25 MG PO TABS: ORAL_TABLET | ORAL | 2 refills | Status: DC

## 2018-06-13 MED FILL — PROMACTA 25 MG TABLET: 25 | 30 days supply | Qty: 30 | Fill #0

## 2018-06-13 NOTE — Telephone Encounter (Signed)
Patient called to reschedule and wanted to see if she could come in before 12/24

## 2018-06-13 NOTE — Telephone Encounter (Signed)
Patient requested earlier appt than 12/24. Per Dr. Irene Limbo, schedule patient before 12/24 at earliest open appt. Informed patient that scheduling dept will contact her. Patient verbalized understanding.

## 2018-06-14 ENCOUNTER — Telehealth: Payer: Self-pay | Admitting: Hematology

## 2018-06-14 NOTE — Telephone Encounter (Signed)
Called regarding 12/9 °

## 2018-06-26 ENCOUNTER — Inpatient Hospital Stay: Payer: Medicaid Other | Attending: Hematology

## 2018-06-26 ENCOUNTER — Telehealth: Payer: Self-pay

## 2018-06-26 ENCOUNTER — Inpatient Hospital Stay (HOSPITAL_BASED_OUTPATIENT_CLINIC_OR_DEPARTMENT_OTHER): Payer: Medicaid Other | Admitting: Hematology

## 2018-06-26 VITALS — BP 136/81 | HR 84 | Temp 98.2°F | Resp 18 | Ht 62.0 in | Wt 342.2 lb

## 2018-06-26 DIAGNOSIS — D509 Iron deficiency anemia, unspecified: Secondary | ICD-10-CM | POA: Diagnosis not present

## 2018-06-26 DIAGNOSIS — Z79899 Other long term (current) drug therapy: Secondary | ICD-10-CM | POA: Diagnosis not present

## 2018-06-26 DIAGNOSIS — E039 Hypothyroidism, unspecified: Secondary | ICD-10-CM

## 2018-06-26 DIAGNOSIS — Z86711 Personal history of pulmonary embolism: Secondary | ICD-10-CM | POA: Insufficient documentation

## 2018-06-26 DIAGNOSIS — D693 Immune thrombocytopenic purpura: Secondary | ICD-10-CM | POA: Diagnosis not present

## 2018-06-26 DIAGNOSIS — E669 Obesity, unspecified: Secondary | ICD-10-CM | POA: Insufficient documentation

## 2018-06-26 DIAGNOSIS — G43909 Migraine, unspecified, not intractable, without status migrainosus: Secondary | ICD-10-CM | POA: Diagnosis not present

## 2018-06-26 LAB — CMP (CANCER CENTER ONLY)
ALBUMIN: 3.3 g/dL — AB (ref 3.5–5.0)
ALK PHOS: 98 U/L (ref 38–126)
ALT: 16 U/L (ref 0–44)
AST: 18 U/L (ref 15–41)
Anion gap: 7 (ref 5–15)
BILIRUBIN TOTAL: 0.3 mg/dL (ref 0.3–1.2)
BUN: 8 mg/dL (ref 6–20)
CALCIUM: 8.8 mg/dL — AB (ref 8.9–10.3)
CO2: 27 mmol/L (ref 22–32)
Chloride: 106 mmol/L (ref 98–111)
Creatinine: 0.69 mg/dL (ref 0.44–1.00)
GFR, Est AFR Am: >60 mL/min (ref >60)
GFR, Estimated: >60 mL/min (ref >60)
Glucose, Bld: 102 mg/dL — ABNORMAL HIGH (ref 70–99)
Potassium: 3.8 mmol/L (ref 3.5–5.1)
Sodium: 140 mmol/L (ref 135–145)
Total Protein: 7.1 g/dL (ref 6.5–8.1)

## 2018-06-26 LAB — CBC WITH DIFFERENTIAL/PLATELET
ABS IMMATURE GRANULOCYTES: 0.07 10*3/uL (ref 0.00–0.07)
BASOS ABS: 0 10*3/uL (ref 0.0–0.1)
Basophils Relative: 0 %
EOS PCT: 2 %
Eosinophils Absolute: 0.2 10*3/uL (ref 0.0–0.5)
HEMATOCRIT: 36.2 % (ref 36.0–46.0)
HEMOGLOBIN: 11.5 g/dL — AB (ref 12.0–15.0)
IMMATURE GRANULOCYTES: 1 %
LYMPHS ABS: 1.2 10*3/uL (ref 0.7–4.0)
LYMPHS PCT: 17 %
MCH: 22.7 pg — ABNORMAL LOW (ref 26.0–34.0)
MCHC: 31.8 g/dL (ref 30.0–36.0)
MCV: 71.4 fL — ABNORMAL LOW (ref 80.0–100.0)
Monocytes Absolute: 0.9 10*3/uL (ref 0.1–1.0)
Monocytes Relative: 12 %
NEUTROS ABS: 4.6 10*3/uL (ref 1.7–7.7)
NRBC: 0 % (ref 0.0–0.2)
Neutrophils Relative %: 68 %
Platelets: 135 10*3/uL — ABNORMAL LOW (ref 150–400)
RBC: 5.07 MIL/uL (ref 3.87–5.11)
RDW: 15.9 % — ABNORMAL HIGH (ref 11.5–15.5)
WBC: 6.9 10*3/uL (ref 4.0–10.5)

## 2018-06-26 LAB — IRON AND TIBC
IRON: 119 ug/dL (ref 41–142)
SATURATION RATIOS: 27 % (ref 21–57)
TIBC: 439 ug/dL (ref 236–444)
UIBC: 320 ug/dL (ref 120–384)

## 2018-06-26 LAB — FERRITIN: Ferritin: 41 ng/mL (ref 11–307)

## 2018-06-26 NOTE — Telephone Encounter (Signed)
Printed avs and calender of upcoming appointment. Per 1/29 los 

## 2018-06-26 NOTE — Progress Notes (Signed)
HEMATOLOGY/ONCOLOGY CLINIC NOTE  Date of Service: 06/26/18  PCP: [place]:  F/u for continued management of ITP  HISTORY OF PRESENTING ILLNESS:   Kiara Barrera is a wonderful 42 y.o. 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  Patient was been managed for her chronic ITP by Dr Yvone Neu at [facility] in [place], [place] and recently moved to [place] and wants to establish care here.  As per review of outside records the patient was diagnosed with ITP since 2012 in [place] and has been treated with prednisone on off since then. She apparently had significant weight gain with prednisone and had chronically low platelets despite the prednisone. She was then started on Promacta 50 mg by mouth daily in June 2016 and had a very good response with platelet counts of more than 500k. Her Promacta was discontinued in July 2016. She had relapse of her ITP in September 2016 with platelet count was down to 49k any significant bleeding or bruising. He has been on variable doses of Promacta since then and reports that she is currently taking 25 mg on Monday Wednesday and Friday.  He has had previous iron deficiency due to heavy periods which was treated with oral iron replacement.  INTERVAL HISTORY   Kiara Barrera is here for evaluation and management of her chronic ITP and iron deficiency anemia. The patient's last visit with [place] was on 02/06/18 and subsequently missed follow ups. The pt reports that she is doing well overall.   The pt reports that she had a wonderful Thanksgiving. She notes that she has been more sleepy recently, and notes that she wakes up around 2am for a few hours and watches TV, then returns to bed. She notes that she feels refreshed after she gets up to begin her day  however.   The pt notes that she ran out of Promacta about 2 weeks ago, but denies any problems tolerating this medication prior to this.   Lab results today (06/26/18) of CBC w/diff and CMP is as follows: all values are WNL except for HGB at 11.5, MCV at 71.4, MCH at 22.7, RDW at 15.9, PLT at 135k, Glucose at 102, Calcium at 8.8, Albumin at 3.3. 06/26/18 Iron and TIBC is pending  06/26/18 Ferritin is at 41  On review of systems, pt reports feeling tired frequently, waking up feeling refreshed, eating well, and denies abdominal pains, leg swelling, concerns for bleeding, and any other symptoms.    MEDICAL HISTORY:  #1 chronic ITP #2 Iron deficiency anemia due to heavy periods #3 hypothyroidism #4 degenerative arthritis #5 anxiety disorder #6 history of pulmonary embolism in 2004 status post IVC filter placement. 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 #7 migraine headaches  SURGICAL HISTORY: Past Surgical History:  Procedure Laterality Date  . C secton  2002  . COLONOSCOPY WITH PROPOFOL N/A 02/07/2018   Procedure: COLONOSCOPY WITH PROPOFOL;  Surgeon: Mauri Pole, MD;  Location: [facility];  Service: Endoscopy;  Laterality: N/A;  . IVC FILTER PLACEMENT ([place] [facility])  2004  . Left axillary lymph node excision biopsy  2008  . POLYPECTOMY  02/07/2018   Procedure: POLYPECTOMY;  Surgeon: Mauri Pole, MD;  Location:  [facility];  Service: Endoscopy;;  Clips Placed    SOCIAL HISTORY: Social History   Socioeconomic History  . Marital status: Single    Spouse name: Not on file  . Number of children: Not on file  . Years of education: Not on file  . Highest education level: Not on file  Occupational History  . Not on file  Social Needs  . Financial resource strain: Not on file  . Food insecurity:    Worry: Not on file    Inability: Not on file  . Transportation needs:      Medical: Not on file    Non-medical: Not on file  Tobacco Use  . Smoking status: Never Smoker  . Smokeless tobacco: Never Used  Substance and Sexual Activity  . Alcohol use: Yes    Comment: occasional: not that often  . Drug use: No  . Sexual activity: Not on file  Lifestyle  . Physical activity:    Days per week: Not on file    Minutes per session: Not on file  . Stress: Not on file  Relationships  . Social connections:    Talks on phone: Not on file    Gets together: Not on file    Attends religious service: Not on file    Active member of club or organization: Not on file    Attends meetings of clubs or organizations: Not on file    Relationship status: Not on file  . Intimate partner violence:    Fear of current or ex partner: Not on file    Emotionally abused: Not on file    Physically abused: Not on file    Forced sexual activity: Not on file  Other Topics Concern  . Not on file  Social History Narrative  . Not on file  Recently moved from [place], [place] to [place]. Nonsmoker minimal social alcohol use no drug use Currently unemployed Has a 48 year old daughter  FAMILY HISTORY: No known history of blood disorders Mother had a history of cervical cancer  maternal grandmother diabetes type 2 Maternal uncle diabetes type 2  ALLERGIES:  is allergic to dextromethorphan and doxylamine succinate (sleep).  MEDICATIONS:  Current Outpatient Medications  Medication Sig Dispense Refill  . Cholecalciferol (VITAMIN D3 PO) Take 1 capsule by mouth daily.    . Cyanocobalamin (B-12) 1000 MCG SUBL Place 1,000 mcg under the tongue daily. 30 each 3  . eltrombopag (PROMACTA) 25 MG tablet TAKE 1 TABLET (25 MG TOTAL) BY MOUTH DAILY. TAKE ON AN EMPTY STOMACH 1 HOUR BEFORE A MEAL OR 2 HOURS AFTER 30 tablet 2  . iron polysaccharides (NIFEREX) 150 MG capsule Take 1 capsule (150 mg total) by mouth 2 (two) times daily.    Marland Kitchen levothyroxine (SYNTHROID, LEVOTHROID) 100 MCG tablet  Take 1 tablet (100 mcg total) by mouth daily before breakfast. 30 tablet 0  . Multiple Vitamin (MULTIVITAMIN) tablet Take 1 tablet by mouth daily.     No current facility-administered medications for this visit.     REVIEW OF SYSTEMS:    A 10+ POINT REVIEW OF SYSTEMS WAS OBTAINED including neurology, dermatology, psychiatry, cardiac, respiratory, lymph, extremities, GI, GU, Musculoskeletal, constitutional, breasts, reproductive, HEENT.  All pertinent positives are noted in the HPI.  All others are negative.   PHYSICAL EXAMINATION: ECOG PERFORMANCE STATUS: 1 - Symptomatic but completely ambulatory  Vitals:   06/26/18 1441  BP: 136/81  Pulse: 84  Resp: 18  Temp: 98.2 F (36.8 C)  SpO2: 100%  Filed Weights   06/26/18 1441  Weight: (!) 342 lb 3.2 oz (155.2 kg)   .Body mass index is 62.59 kg/m.   GENERAL:alert, in no acute distress and comfortable SKIN: no acute rashes, no significant lesions EYES: conjunctiva are pink and non-injected, sclera anicteric OROPHARYNX: MMM, no exudates, no oropharyngeal erythema or ulceration NECK: supple, no JVD LYMPH:  no palpable lymphadenopathy in the cervical, axillary or inguinal regions LUNGS: clear to auscultation b/l with normal respiratory effort HEART: regular rate & rhythm ABDOMEN:  normoactive bowel sounds , non tender, not distended. No palpable hepatosplenomegaly.  Extremity: no pedal edema PSYCH: alert & oriented x 3 with fluent speech NEURO: no focal motor/sensory deficits   LABORATORY DATA:  I have reviewed the data as listed  . CBC Latest Ref Rng & Units 06/26/2018 02/06/2018 12/06/2017  WBC 4.0 - 10.5 K/uL 6.9 5.4 5.1  Hemoglobin 12.0 - 15.0 g/dL 11.5(L) 11.2(L) 11.5(L)  Hematocrit 36.0 - 46.0 % 36.2 35.2 35.4  Platelets 150 - 400 K/uL 135(L) 147 140(L)    CMP Latest Ref Rng & Units 06/26/2018 02/06/2018 12/06/2017  Glucose 70 - 99 mg/dL 102(H) 94 141(H)  BUN 6 - 20 mg/dL 8 11 7   Creatinine 0.44 - 1.00 mg/dL 0.69 0.76  0.79  Sodium 135 - 145 mmol/L 140 138 140  Potassium 3.5 - 5.1 mmol/L 3.8 3.9 4.0  Chloride 98 - 111 mmol/L 106 105 106  CO2 22 - 32 mmol/L 27 25 27   Calcium 8.9 - 10.3 mg/dL 8.8(L) 9.1 8.7  Total Protein 6.5 - 8.1 g/dL 7.1 7.4 6.9  Total Bilirubin 0.3 - 1.2 mg/dL 0.3 0.3 0.3  Alkaline Phos 38 - 126 U/L 98 87 81  AST 15 - 41 U/L 18 17 15   ALT 0 - 44 U/L 16 15 15    . Lab Results  Component Value Date   IRON 119 06/26/2018   TIBC 439 06/26/2018   IRONPCTSAT 27 06/26/2018   (Iron and TIBC)  Lab Results  Component Value Date   FERRITIN 41 06/26/2018    RADIOGRAPHIC STUDIES: I have personally reviewed the radiological images as listed and agreed with the findings in the report. No results found.  ASSESSMENT & PLAN:   42 y.o. African-American female with   1) Chronic ITP since 2012 now with acute relapse of her ITP with very labile platelet counts. No overt bleeding at this time. Patient has been on and off steroids and has been on Promacta since 2016. Was apparently on Promacta 25mg  po MWF. No issues with bleeding at this time. No petechiae B12 wnl. Ultrasound abdomen 06/02/2016- did show some mild splenomegaly which could be an additional factor in her thrombocytopenia. The spleen measures 12.9 x 13.9 x 6.7 cm. The calculated volume is 628 cc. Unclear etiology of splenomegaly. Liver appeared normal.  Patient's platelet counts have normalized after treatment with Rituxan weekly 4 doses with platelets improved temporarily but she needed additional treatment.. Her platelet counts to 147K today.  PLAN:  -Discussed pt labwork today, 06/26/18; HGB stable at 11.5, PLT at 135k, Ferritin at 41, 27% Saturation ratio -The pt has no prohibitive toxicities from continuing 25mg  Promacta at this time.  -Recommended that the patient pursue a sleep study with PCP for possible OSA -Counseled the patient regarding the importance of medical follow up given, especially as she is taking  Promacta and needs regular monitoring and toxicity checks  -Goal for PLT >50k -If counts continue to be stable/increase further, will consider dropping Promacta  on the weekends.  -Advised pt to absolutely avoid NSAIDS, ASA and similar products -Patient was again counseled on importance of weight loss and remaining physically active. I recommend 10,000 steps a day with a balanced healthy diet.  -Will see the pt back in 3 months   2) Iron deficiency Anemia - likely from heavy menstrual losses. -Tolerating the po iron without any significant issues. She previously was not taking PO iron for a period of time because she states that she forgot to pick up her medication. I previously encouraged her to be more compliant with her medication to improve her iron levels to avoid IV iron. She understands and is agreeable.   Lab Results  Component Value Date   FERRITIN 41 06/26/2018   Plan  -Continue oral iron replacement with iron polysaccharide 150 mg by mouth twice a day till ferritin close to 100 -recommended she maintain compliance with this. -Her periods/menorrhagia remain overall stable.   3)  Hypothyroidism - on levothyroxine As per primary care physician -continue Mx per PCP  4) . Patient Active Problem List   Diagnosis Date Noted  . Body mass index (BMI) of 50.0 to 59.9 in adult [facility])   . BRBPR (bright red blood per rectum)   . Polyp of descending colon   . Encntr for gyn exam (general) (routine) w/o abn findings 10/13/2016  . Exposure to STD 10/13/2016  . Chronic ITP (idiopathic thrombocytopenia) (HCC) 05/03/2016  . Thrombocytopenia ([place]) 04/19/2016  . Iron deficiency anemia 04/19/2016  . Hypothyroidism 04/19/2016  . Degenerative arthritis 04/19/2016  . Generalized anxiety disorder 04/19/2016  -continue f/u with PCP  5) Obesity  -discussed lifestyle modifications including need for appropriate diet and exercise. Encouraged her to take at least 10,000 steps a day and have a  balanced healthy diet.  6) H/o PE in 2004-- still has IVC filter in situ. Thought to be triggered by immobility and obesity. -not on anticoagulation at this time.   RTC with Dr Irene Limbo with labs in 46months   All of the patients questions were answered with apparent satisfaction. The patient knows to call the clinic with any problems, questions or concerns.  The total time spent in the appt was 20 minutes and more than 50% was on counseling and direct patient cares.    Sullivan Lone MD [place] AAHIVMS [facility] Hematology/Oncology Physician [facility]  (Office):       [phone] (Work cell):  [phone] (Fax):           [phone]  I, Baldwin [place], am acting as a scribe for Dr. Sullivan Lone.   .I have reviewed the above documentation for accuracy and completeness, and I agree with the above. Brunetta Genera MD

## 2018-07-11 ENCOUNTER — Ambulatory Visit: Payer: Medicaid Other | Admitting: Hematology

## 2018-07-11 ENCOUNTER — Other Ambulatory Visit: Payer: Medicaid Other

## 2018-07-13 MED FILL — PROMACTA 25 MG TABLET: 25 | 30 days supply | Qty: 30 | Fill #1

## 2018-08-10 MED FILL — PROMACTA 25 MG TABLET: 25 | 30 days supply | Qty: 30 | Fill #2

## 2018-09-06 ENCOUNTER — Other Ambulatory Visit: Payer: Self-pay | Admitting: Hematology

## 2018-09-11 MED FILL — PROMACTA 25 MG TABLET: 25 | 30 days supply | Qty: 30 | Fill #0

## 2018-09-22 NOTE — Progress Notes (Signed)
HEMATOLOGY/ONCOLOGY CLINIC NOTE  Date of Service: 09/25/18  PCP: [place]:  F/u for continued management of ITP  HISTORY OF PRESENTING ILLNESS:   Kiara Barrera is a wonderful 43 y.o. 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  Patient was been managed for her chronic ITP by Dr Yvone Neu at [facility] in [place], [place] and recently moved to [place] and wants to establish care here.  As per review of outside records the patient was diagnosed with ITP since 2012 in [place] and has been treated with prednisone on off since then. She apparently had significant weight gain with prednisone and had chronically low platelets despite the prednisone. She was then started on Promacta 50 mg by mouth daily in June 2016 and had a very good response with platelet counts of more than 500k. Her Promacta was discontinued in July 2016. She had relapse of her ITP in September 2016 with platelet count was down to 49k any significant bleeding or bruising. He has been on variable doses of Promacta since then and reports that she is currently taking 25 mg on Monday Wednesday and Friday.  He has had previous iron deficiency due to heavy periods which was treated with oral iron replacement.  INTERVAL HISTORY   Kiara Barrera is here for evaluation and management of her chronic ITP and iron deficiency anemia. The patient's last visit with [place] was on 06/26/18.. The pt reports that she is doing well overall .   The pt notes that she is doing better after having a cold a week recently, and denies having any fevers. The pt reports that she has not developed any other new concerns in the interim. The pt notes that she is taking her iron pills regularly. She notes that her periods have been very heavy when  they occur, but she did have a 3 month gap between periods. She denies any medication changes.  The pt notes that she has continued to tolerate Promacta very well and denies any other concerns for bleeding at this time.  Lab results today (09/25/18) of CBC w/diff, Reticulocytes is as follows: all values are WNL except for HGB at 11.3, HCT at 35.9, MCV at 75.6, MCH at 23.8, RDW at 17.6, PLT at 71k, Abs immature granulocytes at 0.10k, Immature retic fract at 22.2%. 09/25/18 CMP is pending  On review of systems, pt reports good energy levels, infrequent periods, heavy periods, and denies fevers, other concerns of bleeding, nose bleeds, gum bleeds, leg swelling, abdominal pains, and any other symptoms.   MEDICAL HISTORY:  #1 chronic ITP #2 Iron deficiency anemia due to heavy periods #3 hypothyroidism #4 degenerative arthritis #5 anxiety disorder #6 history of pulmonary embolism in 2004 status post IVC filter placement. 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 #7 migraine headaches  SURGICAL HISTORY: Past Surgical History:  Procedure Laterality Date  . C secton  2002  . COLONOSCOPY WITH PROPOFOL N/A 02/07/2018   Procedure: COLONOSCOPY WITH PROPOFOL;  Surgeon: Mauri Pole, MD;  Location: [facility];  Service: Endoscopy;  Laterality: N/A;  . IVC FILTER PLACEMENT ([facility] HX)  2004  . Left axillary lymph node excision biopsy  2008  . POLYPECTOMY  02/07/2018  Procedure: POLYPECTOMY;  Surgeon: Mauri Pole, MD;  Location: [facility];  Service: Endoscopy;;  Clips Placed    SOCIAL HISTORY: Social History   Socioeconomic History  . Marital status: Single    Spouse name: Not on file  . Number of children: Not on file  . Years of education: Not on file  . Highest education level: Not on file  Occupational History  . Not on file  Social Needs  . Financial resource strain: Not on  file  . Food insecurity:    Worry: Not on file    Inability: Not on file  . Transportation needs:    Medical: Not on file    Non-medical: Not on file  Tobacco Use  . Smoking status: Never Smoker  . Smokeless tobacco: Never Used  Substance and Sexual Activity  . Alcohol use: Yes    Comment: occasional: not that often  . Drug use: No  . Sexual activity: Not on file  Lifestyle  . Physical activity:    Days per week: Not on file    Minutes per session: Not on file  . Stress: Not on file  Relationships  . Social connections:    Talks on phone: Not on file    Gets together: Not on file    Attends religious service: Not on file    Active member of club or organization: Not on file    Attends meetings of clubs or organizations: Not on file    Relationship status: Not on file  . Intimate partner violence:    Fear of current or ex partner: Not on file    Emotionally abused: Not on file    Physically abused: Not on file    Forced sexual activity: Not on file  Other Topics Concern  . Not on file  Social History Narrative  . Not on file  Recently moved from [place], [place] to [place]. Nonsmoker minimal social alcohol use no drug use Currently unemployed Has a 45 year old daughter  FAMILY HISTORY: No known history of blood disorders Mother had a history of cervical cancer  maternal grandmother diabetes type 2 Maternal uncle diabetes type 2  ALLERGIES:  is allergic to dextromethorphan and doxylamine succinate (sleep).  MEDICATIONS:  Current Outpatient Medications  Medication Sig Dispense Refill  . Cholecalciferol (VITAMIN D3 PO) Take 1 capsule by mouth daily.    . Cyanocobalamin (B-12) 1000 MCG SUBL Place 1,000 mcg under the tongue daily. 30 each 3  . eltrombopag (PROMACTA) 25 MG tablet TAKE 1 TABLET (25 MG TOTAL) BY MOUTH DAILY. TAKE ON AN EMPTY STOMACH 1 HOUR BEFORE A MEAL OR 2 HOURS AFTER 30 tablet 2  . iron polysaccharides (NIFEREX) 150 MG capsule Take 1 capsule (150  mg total) by mouth 2 (two) times daily.    Marland Kitchen levothyroxine (SYNTHROID, LEVOTHROID) 100 MCG tablet Take 1 tablet (100 mcg total) by mouth daily before breakfast. 30 tablet 0  . Multiple Vitamin (MULTIVITAMIN) tablet Take 1 tablet by mouth daily.     No current facility-administered medications for this visit.     REVIEW OF SYSTEMS:    A 10+ POINT REVIEW OF SYSTEMS WAS OBTAINED including neurology, dermatology, psychiatry, cardiac, respiratory, lymph, extremities, GI, GU, Musculoskeletal, constitutional, breasts, reproductive, HEENT.  All pertinent positives are noted in the HPI.  All others are negative.  PHYSICAL EXAMINATION: ECOG PERFORMANCE STATUS: 1 - Symptomatic but completely ambulatory  Vitals:   09/25/18 1220  BP: (!) 153/86  Pulse: 91  Resp: 18  Temp: 98.2 F (36.8 C)  SpO2: 100%   Filed Weights   09/25/18 1220  Weight: (!) 347 lb 3.2 oz (157.5 kg)   .Body mass index is 63.5 kg/m.   GENERAL:alert, in no acute distress and comfortable SKIN: no acute rashes, no significant lesions EYES: conjunctiva are pink and non-injected, sclera anicteric OROPHARYNX: MMM, no exudates, no oropharyngeal erythema or ulceration NECK: supple, no JVD LYMPH:  no palpable lymphadenopathy in the cervical, axillary or inguinal regions LUNGS: clear to auscultation b/l with normal respiratory effort HEART: regular rate & rhythm ABDOMEN:  normoactive bowel sounds , non tender, not distended. No palpable hepatosplenomegaly.  Extremity: no pedal edema PSYCH: alert & oriented x 3 with fluent speech NEURO: no focal motor/sensory deficits   LABORATORY DATA:  I have reviewed the data as listed  . CBC Latest Ref Rng & Units 09/25/2018 06/26/2018 02/06/2018  WBC 4.0 - 10.5 K/uL 7.2 6.9 5.4  Hemoglobin 12.0 - 15.0 g/dL 11.3(L) 11.5(L) 11.2(L)  Hematocrit 36.0 - 46.0 % 35.9(L) 36.2 35.2  Platelets 150 - 400 K/uL 71(L) 135(L) 147    CMP Latest Ref Rng & Units 06/26/2018 02/06/2018 12/06/2017  Glucose  70 - 99 mg/dL 102(H) 94 141(H)  BUN 6 - 20 mg/dL 8 11 7   Creatinine 0.44 - 1.00 mg/dL 0.69 0.76 0.79  Sodium 135 - 145 mmol/L 140 138 140  Potassium 3.5 - 5.1 mmol/L 3.8 3.9 4.0  Chloride 98 - 111 mmol/L 106 105 106  CO2 22 - 32 mmol/L 27 25 27   Calcium 8.9 - 10.3 mg/dL 8.8(L) 9.1 8.7  Total Protein 6.5 - 8.1 g/dL 7.1 7.4 6.9  Total Bilirubin 0.3 - 1.2 mg/dL 0.3 0.3 0.3  Alkaline Phos 38 - 126 U/L 98 87 81  AST 15 - 41 U/L 18 17 15   ALT 0 - 44 U/L 16 15 15    . Lab Results  Component Value Date   IRON 119 06/26/2018   TIBC 439 06/26/2018   IRONPCTSAT 27 06/26/2018   (Iron and TIBC)  Lab Results  Component Value Date   FERRITIN 41 06/26/2018    RADIOGRAPHIC STUDIES: I have personally reviewed the radiological images as listed and agreed with the findings in the report. No results found.  ASSESSMENT & PLAN:   43 y.o. African-American female with   1) Chronic ITP since 2012 now with acute relapse of her ITP with very labile platelet counts. No overt bleeding at this time. Patient has been on and off steroids and has been on Promacta since 2016. Was apparently on Promacta 25mg  po MWF. No issues with bleeding at this time. No petechiae B12 wnl. Ultrasound abdomen 06/02/2016- did show some mild splenomegaly which could be an additional factor in her thrombocytopenia. The spleen measures 12.9 x 13.9 x 6.7 cm. The calculated volume is 628 cc. Unclear etiology of splenomegaly. Liver appeared normal.  Patient's platelet counts have normalized after treatment with Rituxan weekly 4 doses with platelets improved temporarily but she needed additional treatment.  PLAN:  -Discussed pt labwork today, 09/25/18; PLT lower at 71k with recent viral infection, HGB at 11.3 -Continue PO 150mg  Iron Polysaccharide BID -Continue 25mg  Promacta daily -Goal for PLT >50k -If counts continue to be stable/increase further, will consider dropping Promacta on the weekends. -Advised pt to absolutely  avoid NSAIDS, ASA and similar products -Patient was again counseled on importance of weight loss and remaining physically active. I recommend 10,000 steps a day with a balanced healthy diet.  -Recommended that  the patient pursue a sleep study with PCP for possible OSA -Counseled the patient regarding the importance of medical follow up given, especially as she is taking Promacta and needs regular monitoring and toxicity checks -Will see the pt back in 2 months  2) Iron deficiency Anemia - likely from heavy menstrual losses. -Tolerating the po iron without any significant issues. She previously was not taking PO iron for a period of time because she states that she forgot to pick up her medication. I previously encouraged her to be more compliant with her medication to improve her iron levels to avoid IV iron. She understands and is agreeable.   Lab Results  Component Value Date   FERRITIN 41 06/26/2018   Plan  -Continue oral iron replacement with iron polysaccharide 150 mg by mouth twice a day till ferritin close to 100 -Her periods/menorrhagia remain overall stable.   3)  Hypothyroidism - on levothyroxine As per primary care physician -continue Mx per PCP  4) . Patient Active Problem List   Diagnosis Date Noted  . Body mass index (BMI) of 50.0 to 59.9 in adult [facility])   . BRBPR (bright red blood per rectum)   . Polyp of descending colon   . Encntr for gyn exam (general) (routine) w/o abn findings 10/13/2016  . Exposure to STD 10/13/2016  . Chronic ITP (idiopathic thrombocytopenia) (HCC) 05/03/2016  . Thrombocytopenia ([place]) 04/19/2016  . Iron deficiency anemia 04/19/2016  . Hypothyroidism 04/19/2016  . Degenerative arthritis 04/19/2016  . Generalized anxiety disorder 04/19/2016  -continue f/u with PCP  5) Obesity  -discussed lifestyle modifications including need for appropriate diet and exercise. Encouraged her to take at least 10,000 steps a day and have a balanced healthy  diet.  6) H/o PE in 2004-- still has IVC filter in situ. Thought to be triggered by immobility and obesity. -not on anticoagulation at this time.   RTC with Dr Irene Limbo with labs in 2 months   All of the patients questions were answered with apparent satisfaction. The patient knows to call the clinic with any problems, questions or concerns.  The total time spent in the appt was 20 minutes and more than 50% was on counseling and direct patient cares.   Sullivan Lone MD [place] AAHIVMS [facility] Hematology/Oncology Physician [facility]  (Office):       [phone] (Work cell):  [phone] (Fax):           [phone]  I, Baldwin [place], am acting as a scribe for Dr. Sullivan Lone.   .I have reviewed the above documentation for accuracy and completeness, and I agree with the above. Brunetta Genera MD

## 2018-09-25 ENCOUNTER — Inpatient Hospital Stay: Payer: Medicaid Other | Attending: Hematology | Admitting: Hematology

## 2018-09-25 ENCOUNTER — Inpatient Hospital Stay: Payer: Medicaid Other

## 2018-09-25 ENCOUNTER — Telehealth: Payer: Self-pay | Admitting: Hematology

## 2018-09-25 VITALS — BP 153/86 | HR 91 | Temp 98.2°F | Resp 18 | Ht 62.0 in | Wt 347.2 lb

## 2018-09-25 DIAGNOSIS — D509 Iron deficiency anemia, unspecified: Secondary | ICD-10-CM | POA: Insufficient documentation

## 2018-09-25 DIAGNOSIS — Z79899 Other long term (current) drug therapy: Secondary | ICD-10-CM | POA: Insufficient documentation

## 2018-09-25 DIAGNOSIS — D693 Immune thrombocytopenic purpura: Secondary | ICD-10-CM | POA: Insufficient documentation

## 2018-09-25 DIAGNOSIS — E039 Hypothyroidism, unspecified: Secondary | ICD-10-CM

## 2018-09-25 DIAGNOSIS — Z86711 Personal history of pulmonary embolism: Secondary | ICD-10-CM | POA: Diagnosis not present

## 2018-09-25 DIAGNOSIS — E669 Obesity, unspecified: Secondary | ICD-10-CM | POA: Diagnosis not present

## 2018-09-25 LAB — CBC WITH DIFFERENTIAL/PLATELET
ABS IMMATURE GRANULOCYTES: 0.1 10*3/uL — AB (ref 0.00–0.07)
BASOS ABS: 0 10*3/uL (ref 0.0–0.1)
Basophils Relative: 0 %
EOS PCT: 2 %
Eosinophils Absolute: 0.1 10*3/uL (ref 0.0–0.5)
HCT: 35.9 % — ABNORMAL LOW (ref 36.0–46.0)
HEMOGLOBIN: 11.3 g/dL — AB (ref 12.0–15.0)
Immature Granulocytes: 1 %
LYMPHS PCT: 15 %
Lymphs Abs: 1.1 10*3/uL (ref 0.7–4.0)
MCH: 23.8 pg — ABNORMAL LOW (ref 26.0–34.0)
MCHC: 31.5 g/dL (ref 30.0–36.0)
MCV: 75.6 fL — ABNORMAL LOW (ref 80.0–100.0)
Monocytes Absolute: 0.6 10*3/uL (ref 0.1–1.0)
Monocytes Relative: 8 %
NEUTROS ABS: 5.2 10*3/uL (ref 1.7–7.7)
NRBC: 0 % (ref 0.0–0.2)
Neutrophils Relative %: 74 %
Platelets: 71 10*3/uL — ABNORMAL LOW (ref 150–400)
RBC: 4.75 MIL/uL (ref 3.87–5.11)
RDW: 17.6 % — ABNORMAL HIGH (ref 11.5–15.5)
WBC: 7.2 10*3/uL (ref 4.0–10.5)

## 2018-09-25 LAB — COMPREHENSIVE METABOLIC PANEL
ALBUMIN: 3.2 g/dL — AB (ref 3.5–5.0)
ALK PHOS: 81 U/L (ref 38–126)
ALT: 15 U/L (ref 0–44)
ANION GAP: 11 (ref 5–15)
AST: 16 U/L (ref 15–41)
BUN: 10 mg/dL (ref 6–20)
CALCIUM: 8.7 mg/dL — AB (ref 8.9–10.3)
CO2: 24 mmol/L (ref 22–32)
Chloride: 105 mmol/L (ref 98–111)
Creatinine, Ser: 0.73 mg/dL (ref 0.44–1.00)
GFR calc Af Amer: >60 mL/min (ref >60)
GFR calc non Af Amer: >60 mL/min (ref >60)
GLUCOSE: 143 mg/dL — AB (ref 70–99)
POTASSIUM: 3.7 mmol/L (ref 3.5–5.1)
Sodium: 140 mmol/L (ref 135–145)
Total Bilirubin: 0.4 mg/dL (ref 0.3–1.2)
Total Protein: 6.7 g/dL (ref 6.5–8.1)

## 2018-09-25 LAB — RETICULOCYTES
Immature Retic Fract: 22.2 % — ABNORMAL HIGH (ref 2.3–15.9)
RBC.: 4.75 MIL/uL (ref 3.87–5.11)
RETIC COUNT ABSOLUTE: 98.3 10*3/uL (ref 19.0–186.0)
Retic Ct Pct: 2.1 % (ref 0.4–3.1)

## 2018-09-25 NOTE — Telephone Encounter (Signed)
Scheduled appt per los 03/09

## 2018-10-13 MED FILL — PROMACTA 25 MG TABLET: 25 | 30 days supply | Qty: 30 | Fill #1

## 2018-11-09 MED FILL — PROMACTA 25 MG TABLET: 25 | 30 days supply | Qty: 30 | Fill #2

## 2018-11-24 NOTE — Progress Notes (Signed)
HEMATOLOGY/ONCOLOGY CLINIC NOTE  Date of Service: 11/27/18  PCP: [place]:  F/u for continued management of ITP  HISTORY OF PRESENTING ILLNESS:   Kiara Barrera is a wonderful 43 y.o. 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  Patient was been managed for her chronic ITP by Dr Yvone Neu at [facility] in [place], [place] and recently moved to [place] and wants to establish care here.  As per review of outside records the patient was diagnosed with ITP since 2012 in [place] and has been treated with prednisone on off since then. She apparently had significant weight gain with prednisone and had chronically low platelets despite the prednisone. She was then started on Promacta 50 mg by mouth daily in June 2016 and had a very good response with platelet counts of more than 500k. Her Promacta was discontinued in July 2016. She had relapse of her ITP in September 2016 with platelet count was down to 49k any significant bleeding or bruising. He has been on variable doses of Promacta since then and reports that she is currently taking 25 mg on Monday Wednesday and Friday.  He has had previous iron deficiency due to heavy periods which was treated with oral iron replacement.  INTERVAL HISTORY   Ms Kiara Barrera is here for evaluation and management of her chronic ITP and iron deficiency anemia. The patient's last visit with Kiara Barrera was on 09/25/18. The pt reports that she is doing well overall.  The pt reports that she has not developed any new concerns in the interim. She notes compliance with taking her 25mg  Promacta. She notes that she has also been taking her daily PO Iron replacement, and notes that her periods have been stable and last 6-7 days. She denies any concerns for infections. She  denies any concerns for abnormal bleeding or bruising at this time.  Lab results today (11/27/18) of CBC w/diff and CMP is as follows: all values are WNL except for MCV at 78.3, MCH at 25.1, PLT at 85k, Glucose at 137, Calcium at 8.8. 11/27/18 Iron and TIBC is pending 11/27/18 Ferritin is pending  On review of systems, pt reports good energy levels, stable periods, moving her bowels well, eating her well, and denies concerns for infections, abdominal pains, leg swelling, and any other symptoms.   MEDICAL HISTORY:  #1 chronic ITP #2 Iron deficiency anemia due to heavy periods #3 hypothyroidism #4 degenerative arthritis #5 anxiety disorder #6 history of pulmonary embolism in 2004 status post IVC filter placement. 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 #7 migraine headaches  SURGICAL HISTORY: Past Surgical History:  Procedure Laterality Date  . C secton  2002  . COLONOSCOPY WITH PROPOFOL N/A 02/07/2018   Procedure: COLONOSCOPY WITH PROPOFOL;  Surgeon: Mauri Pole, MD;  Location: [facility];  Service: Endoscopy;  Laterality: N/A;  . IVC FILTER PLACEMENT ([place] HX)  2004  . Left axillary lymph node excision biopsy  2008  . POLYPECTOMY  02/07/2018   Procedure: POLYPECTOMY;  Surgeon: Mauri Pole, MD;  Location: [facility];  Service: Endoscopy;;  Clips Placed    SOCIAL HISTORY: Social History   Socioeconomic History  . Marital status: Single    Spouse  name: Not on file  . Number of children: Not on file  . Years of education: Not on file  . Highest education level: Not on file  Occupational History  . Not on file  Social Needs  . Financial resource strain: Not on file  . Food insecurity:    Worry: Not on file    Inability: Not on file  . Transportation needs:    Medical: Not on file    Non-medical: Not on file  Tobacco Use  . Smoking status: Never Smoker  .  Smokeless tobacco: Never Used  Substance and Sexual Activity  . Alcohol use: Yes    Comment: occasional: not that often  . Drug use: No  . Sexual activity: Not on file  Lifestyle  . Physical activity:    Days per week: Not on file    Minutes per session: Not on file  . Stress: Not on file  Relationships  . Social connections:    Talks on phone: Not on file    Gets together: Not on file    Attends religious service: Not on file    Active member of club or organization: Not on file    Attends meetings of clubs or organizations: Not on file    Relationship status: Not on file  . Intimate partner violence:    Fear of current or ex partner: Not on file    Emotionally abused: Not on file    Physically abused: Not on file    Forced sexual activity: Not on file  Other Topics Concern  . Not on file  Social History Narrative  . Not on file  Recently moved from [place], [place] to [place]. Nonsmoker minimal social alcohol use no drug use Currently unemployed Has a 52 year old daughter  FAMILY HISTORY: No known history of blood disorders Mother had a history of cervical cancer  maternal grandmother diabetes type 2 Maternal uncle diabetes type 2  ALLERGIES:  is allergic to dextromethorphan and doxylamine succinate (sleep).  MEDICATIONS:  Current Outpatient Medications  Medication Sig Dispense Refill  . Cholecalciferol (VITAMIN D3 PO) Take 1 capsule by mouth daily.    . Cyanocobalamin (B-12) 1000 MCG SUBL Place 1,000 mcg under the tongue daily. 30 each 3  . eltrombopag (PROMACTA) 25 MG tablet TAKE 1 TABLET (25 MG TOTAL) BY MOUTH DAILY. TAKE ON AN EMPTY STOMACH 1 HOUR BEFORE A MEAL OR 2 HOURS AFTER 30 tablet 2  . iron polysaccharides (NIFEREX) 150 MG capsule Take 1 capsule (150 mg total) by mouth 2 (two) times daily.    Marland Kitchen levothyroxine (SYNTHROID, LEVOTHROID) 100 MCG tablet Take 1 tablet (100 mcg total) by mouth daily before breakfast. 30 tablet 0  . Multiple Vitamin  (MULTIVITAMIN) tablet Take 1 tablet by mouth daily.     No current facility-administered medications for this visit.     REVIEW OF SYSTEMS:    A 10+ POINT REVIEW OF SYSTEMS WAS OBTAINED including neurology, dermatology, psychiatry, cardiac, respiratory, lymph, extremities, GI, GU, Musculoskeletal, constitutional, breasts, reproductive, HEENT.  All pertinent positives are noted in the HPI.  All others are negative.   PHYSICAL EXAMINATION: ECOG PERFORMANCE STATUS: 1 - Symptomatic but completely ambulatory  Vitals:   11/27/18 0936  BP: (!) 147/91  Pulse: 84  Resp: 18  Temp: 97.8 F (36.6 C)  SpO2: 99%   Filed Weights   11/27/18 0936  Weight: (!) 347 lb 1.6 oz (157.4 kg)   .Body mass index is 63.49 kg/m.   GENERAL:alert, in  no acute distress and comfortable SKIN: no acute rashes, no significant lesions EYES: conjunctiva are pink and non-injected, sclera anicteric OROPHARYNX: MMM, no exudates, no oropharyngeal erythema or ulceration NECK: supple, no JVD LYMPH:  no palpable lymphadenopathy in the cervical, axillary or inguinal regions LUNGS: clear to auscultation b/l with normal respiratory effort HEART: regular rate & rhythm ABDOMEN:  normoactive bowel sounds , non tender, not distended. No palpable hepatosplenomegaly.  Extremity: no pedal edema PSYCH: alert & oriented x 3 with fluent speech NEURO: no focal motor/sensory deficits   LABORATORY DATA:  I have reviewed the data as listed  . CBC Latest Ref Rng & Units 11/27/2018 09/25/2018 06/26/2018  WBC 4.0 - 10.5 K/uL 6.7 7.2 6.9  Hemoglobin 12.0 - 15.0 g/dL 12.4 11.3(L) 11.5(L)  Hematocrit 36.0 - 46.0 % 38.7 35.9(L) 36.2  Platelets 150 - 400 K/uL 85(L) 71(L) 135(L)    CMP Latest Ref Rng & Units 11/27/2018 09/25/2018 06/26/2018  Glucose 70 - 99 mg/dL 137(H) 143(H) 102(H)  BUN 6 - 20 mg/dL 8 10 8   Creatinine 0.44 - 1.00 mg/dL 0.82 0.73 0.69  Sodium 135 - 145 mmol/L 138 140 140  Potassium 3.5 - 5.1 mmol/L 4.2 3.7 3.8   Chloride 98 - 111 mmol/L 103 105 106  CO2 22 - 32 mmol/L 25 24 27   Calcium 8.9 - 10.3 mg/dL 8.8(L) 8.7(L) 8.8(L)  Total Protein 6.5 - 8.1 g/dL 7.3 6.7 7.1  Total Bilirubin 0.3 - 1.2 mg/dL 0.5 0.4 0.3  Alkaline Phos 38 - 126 U/L 94 81 98  AST 15 - 41 U/L 20 16 18   ALT 0 - 44 U/L 21 15 16    . Lab Results  Component Value Date   IRON 119 06/26/2018   TIBC 439 06/26/2018   IRONPCTSAT 27 06/26/2018   (Iron and TIBC)  Lab Results  Component Value Date   FERRITIN 41 06/26/2018    RADIOGRAPHIC STUDIES: I have personally reviewed the radiological images as listed and agreed with the findings in the report. No results found.  ASSESSMENT & PLAN:   43 y.o. African-American female with   1) Chronic ITP since 2012 now with acute relapse of her ITP with very labile platelet counts. No overt bleeding at this time. Patient has been on and off steroids and has been on Promacta since 2016. Was apparently on Promacta 25mg  po MWF. No issues with bleeding at this time. No petechiae B12 wnl. Ultrasound abdomen 06/02/2016- did show some mild splenomegaly which could be an additional factor in her thrombocytopenia. The spleen measures 12.9 x 13.9 x 6.7 cm. The calculated volume is 628 cc. Unclear etiology of splenomegaly. Liver appeared normal.  Patient's platelet counts have normalized after treatment with Rituxan weekly 4 doses with platelets improved temporarily but she needed additional treatment.  PLAN:  -Discussed pt labwork today, 11/27/18; PLT okay at [place]. WBC normal at 6.7k. HGB improved at 12.4. -11/27/18 Ferritin is pending -The pt has no prohibitive toxicities from continuing 25mg  Promacta at this time.  -Goal for PLT >50k -Continue 150mg  Iron Polysaccharide BID -If counts continue to be stable/increase further, will consider dropping Promacta on the weekends. -Advised pt to absolutely avoid NSAIDS, ASA and similar products -Patient was again counseled on importance of weight loss  and remaining physically active. I recommend 10,000 steps a day with a balanced healthy diet.  -Recommended that the patient pursue a sleep study with PCP for possible OSA -Counseled the patient regarding the importance of medical follow up given, especially  as she is taking Promacta and needs regular monitoring and toxicity checks -Will see the pt back in 2 months  2) Iron deficiency Anemia - likely from heavy menstrual losses. -Tolerating the po iron without any significant issues. She previously was not taking PO iron for a period of time because she states that she forgot to pick up her medication. I previously encouraged her to be more compliant with her medication to improve her iron levels to avoid IV iron. She understands and is agreeable.   Lab Results  Component Value Date   FERRITIN 41 06/26/2018   Plan  -Continue oral iron replacement with iron polysaccharide 150 mg by mouth twice a day till ferritin close to 100 -Her periods/menorrhagia remain overall stable.   3)  Hypothyroidism - on levothyroxine As per primary care physician -continue Mx per PCP  4) . Patient Active Problem List   Diagnosis Date Noted  . Body mass index (BMI) of 50.0 to 59.9 in adult [facility])   . BRBPR (bright red blood per rectum)   . Polyp of descending colon   . Encntr for gyn exam (general) (routine) w/o abn findings 10/13/2016  . Exposure to STD 10/13/2016  . Chronic ITP (idiopathic thrombocytopenia) (HCC) 05/03/2016  . Thrombocytopenia ([facility]) 04/19/2016  . Iron deficiency anemia 04/19/2016  . Hypothyroidism 04/19/2016  . Degenerative arthritis 04/19/2016  . Generalized anxiety disorder 04/19/2016  -continue f/u with PCP  5) Obesity  -discussed lifestyle modifications including need for appropriate diet and exercise. Encouraged her to take at least 10,000 steps a day and have a balanced healthy diet.  6) H/o PE in 2004-- still has IVC filter in situ. Thought to be triggered by immobility and  obesity. -not on anticoagulation at this time.   RTC with dr Irene Limbo with labs in 2 months   All of the patients questions were answered with apparent satisfaction. The patient knows to call the clinic with any problems, questions or concerns.  The total time spent in the appt was 20 minutes and more than 50% was on counseling and direct patient cares.   Sullivan Lone MD [place] AAHIVMS [facility] Hematology/Oncology Physician [facility]  (Office):       [phone] (Work cell):  [phone] (Fax):           [phone]  I, Baldwin [place], am acting as a scribe for Dr. Sullivan Lone.   .I have reviewed the above documentation for accuracy and completeness, and I agree with the above. Brunetta Genera MD

## 2018-11-27 ENCOUNTER — Inpatient Hospital Stay (HOSPITAL_BASED_OUTPATIENT_CLINIC_OR_DEPARTMENT_OTHER): Payer: Medicaid Other | Admitting: Hematology

## 2018-11-27 ENCOUNTER — Telehealth: Payer: Self-pay | Admitting: Hematology

## 2018-11-27 ENCOUNTER — Inpatient Hospital Stay: Payer: Medicaid Other | Attending: Hematology

## 2018-11-27 ENCOUNTER — Other Ambulatory Visit: Payer: Self-pay

## 2018-11-27 VITALS — BP 147/91 | HR 84 | Temp 97.8°F | Resp 18 | Ht 62.0 in | Wt 347.1 lb

## 2018-11-27 DIAGNOSIS — D693 Immune thrombocytopenic purpura: Secondary | ICD-10-CM | POA: Diagnosis not present

## 2018-11-27 DIAGNOSIS — E039 Hypothyroidism, unspecified: Secondary | ICD-10-CM | POA: Insufficient documentation

## 2018-11-27 DIAGNOSIS — D509 Iron deficiency anemia, unspecified: Secondary | ICD-10-CM

## 2018-11-27 DIAGNOSIS — Z86711 Personal history of pulmonary embolism: Secondary | ICD-10-CM | POA: Insufficient documentation

## 2018-11-27 DIAGNOSIS — Z79899 Other long term (current) drug therapy: Secondary | ICD-10-CM | POA: Diagnosis not present

## 2018-11-27 DIAGNOSIS — E669 Obesity, unspecified: Secondary | ICD-10-CM

## 2018-11-27 LAB — CMP (CANCER CENTER ONLY)
ALT: 21 U/L (ref 0–44)
AST: 20 U/L (ref 15–41)
Albumin: 3.5 g/dL (ref 3.5–5.0)
Alkaline Phosphatase: 94 U/L (ref 38–126)
Anion gap: 10 (ref 5–15)
BUN: 8 mg/dL (ref 6–20)
CO2: 25 mmol/L (ref 22–32)
Calcium: 8.8 mg/dL — ABNORMAL LOW (ref 8.9–10.3)
Chloride: 103 mmol/L (ref 98–111)
Creatinine: 0.82 mg/dL (ref 0.44–1.00)
GFR, Est AFR Am: >60 mL/min (ref >60)
GFR, Estimated: >60 mL/min (ref >60)
Glucose, Bld: 137 mg/dL — ABNORMAL HIGH (ref 70–99)
Potassium: 4.2 mmol/L (ref 3.5–5.1)
Sodium: 138 mmol/L (ref 135–145)
Total Bilirubin: 0.5 mg/dL (ref 0.3–1.2)
Total Protein: 7.3 g/dL (ref 6.5–8.1)

## 2018-11-27 LAB — CBC WITH DIFFERENTIAL/PLATELET
Abs Immature Granulocytes: 0.06 10*3/uL (ref 0.00–0.07)
Basophils Absolute: 0 10*3/uL (ref 0.0–0.1)
Basophils Relative: 0 %
Eosinophils Absolute: 0.1 10*3/uL (ref 0.0–0.5)
Eosinophils Relative: 2 %
HCT: 38.7 % (ref 36.0–46.0)
Hemoglobin: 12.4 g/dL (ref 12.0–15.0)
Immature Granulocytes: 1 %
Lymphocytes Relative: 17 %
Lymphs Abs: 1.1 10*3/uL (ref 0.7–4.0)
MCH: 25.1 pg — ABNORMAL LOW (ref 26.0–34.0)
MCHC: 32 g/dL (ref 30.0–36.0)
MCV: 78.3 fL — ABNORMAL LOW (ref 80.0–100.0)
Monocytes Absolute: 0.7 10*3/uL (ref 0.1–1.0)
Monocytes Relative: 10 %
Neutro Abs: 4.7 10*3/uL (ref 1.7–7.7)
Neutrophils Relative %: 70 %
Platelets: 85 10*3/uL — ABNORMAL LOW (ref 150–400)
RBC: 4.94 MIL/uL (ref 3.87–5.11)
RDW: 14.1 % (ref 11.5–15.5)
WBC: 6.7 10*3/uL (ref 4.0–10.5)
nRBC: 0 % (ref 0.0–0.2)

## 2018-11-27 LAB — IRON AND TIBC
Iron: 72 ug/dL (ref 41–142)
Saturation Ratios: 18 % — ABNORMAL LOW (ref 21–57)
TIBC: 394 ug/dL (ref 236–444)
UIBC: 322 ug/dL (ref 120–384)

## 2018-11-27 LAB — FERRITIN: Ferritin: 55 ng/mL (ref 11–307)

## 2018-11-27 NOTE — Telephone Encounter (Signed)
Scheduled appt per 5/11 los. ° °A calendar will be mailed out. °

## 2018-11-29 ENCOUNTER — Other Ambulatory Visit: Payer: Self-pay | Admitting: Pharmacist

## 2018-11-29 DIAGNOSIS — D693 Immune thrombocytopenic purpura: Secondary | ICD-10-CM

## 2018-11-29 MED ORDER — ELTROMBOPAG OLAMINE 25 MG PO TABS: 25.0000 mg | ORAL_TABLET | Freq: Every day | ORAL | 2 refills | Status: DC

## 2018-12-14 MED FILL — PROMACTA 25 MG TABLET: 25 | 30 days supply | Qty: 30 | Fill #0

## 2019-01-09 MED FILL — PROMACTA 25 MG TABLET: 25 | 30 days supply | Qty: 30 | Fill #1

## 2019-01-25 NOTE — Progress Notes (Signed)
HEMATOLOGY/ONCOLOGY CLINIC NOTE  Date of Service: 01/26/19  PCP: [place]:  F/u for continued management of ITP  HISTORY OF PRESENTING ILLNESS:   Kiara Barrera is a wonderful 43 y.o. 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  Patient was been managed for her chronic ITP by Dr Yvone Neu at [facility] in [place], [place] and recently moved to [place] and wants to establish care here.  As per review of outside records the patient was diagnosed with ITP since 2012 in [place] and has been treated with prednisone on off since then. She apparently had significant weight gain with prednisone and had chronically low platelets despite the prednisone. She was then started on Promacta 50 mg by mouth daily in June 2016 and had a very good response with platelet counts of more than 500k. Her Promacta was discontinued in July 2016. She had relapse of her ITP in September 2016 with platelet count was down to 49k any significant bleeding or bruising. He has been on variable doses of Promacta since then and reports that she is currently taking 25 mg on Monday Wednesday and Friday.  He has had previous iron deficiency due to heavy periods which was treated with oral iron replacement.  INTERVAL HISTORY   Ms Farrey is here for evaluation and management of her chronic ITP and iron deficiency anemia. The patient's last visit with [place] was on 11/27/18. The pt reports that she is doing well overall.  The pt reports that she has been socially distancing and has been avoiding crowds. She denies any concerns for infections at this time. She notes that she has not been very active and has been mostly staying insider her house.   The pt notes that she has continued on 25mg  Promacta without complaint  and has also continued on 150mg  Iron Polysaccharide daily. She notes that her periods have been regular and stable, denies heaviness.   The pt endorses some stable ankle swelling but denies leg pain or new SOB. The pt notes that she is staying very hydrated and is consuming a gallon of water each day. She adds that she has been thinking about becoming vegan and has been trying to monitor her sodium intake.  Lab results today (01/26/19) of CBC w/diff and CMP is as follows: all values are WNL except for MCV at 76.2, MCH at 25.1, PLT at 63k, Abs immature granulocytes at 0.10k, Glucose at 132, Calcium at 8.6. 01/26/19 Ferritin is 84  On review of systems, pt reports stable periods, stable energy levels, moving her bowels well, staying well hydrated, and denies concerns for infections, SOB, leg pain, abdominal pains, and any other symptoms.   MEDICAL HISTORY:  #1 chronic ITP #2 Iron deficiency anemia due to heavy periods #3 hypothyroidism #4 degenerative arthritis #5 anxiety disorder #6 history of pulmonary embolism in 2004 status post IVC filter placement. 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 #7 migraine headaches  SURGICAL HISTORY: Past Surgical History:  Procedure Laterality Date  . C secton  2002  . COLONOSCOPY WITH PROPOFOL N/A 02/07/2018   Procedure: COLONOSCOPY WITH PROPOFOL;  Surgeon: Mauri Pole, MD;  Location: [facility];  Service: Endoscopy;  Laterality: N/A;  .  IVC FILTER PLACEMENT ([facility] HX)  2004  . Left axillary lymph node excision biopsy  2008  . POLYPECTOMY  02/07/2018   Procedure: POLYPECTOMY;  Surgeon: Mauri Pole, MD;  Location: [facility];  Service: Endoscopy;;  Clips Placed    SOCIAL HISTORY: Social History   Socioeconomic History  . Marital status: Single    Spouse name: Not on file  . Number of children: Not on file  . Years of education:  Not on file  . Highest education level: Not on file  Occupational History  . Not on file  Social Needs  . Financial resource strain: Not on file  . Food insecurity    Worry: Not on file    Inability: Not on file  . Transportation needs    Medical: Not on file    Non-medical: Not on file  Tobacco Use  . Smoking status: Never Smoker  . Smokeless tobacco: Never Used  Substance and Sexual Activity  . Alcohol use: Yes    Comment: occasional: not that often  . Drug use: No  . Sexual activity: Not on file  Lifestyle  . Physical activity    Days per week: Not on file    Minutes per session: Not on file  . Stress: Not on file  Relationships  . Social Herbalist on phone: Not on file    Gets together: Not on file    Attends religious service: Not on file    Active member of club or organization: Not on file    Attends meetings of clubs or organizations: Not on file    Relationship status: Not on file  . Intimate partner violence    Fear of current or ex partner: Not on file    Emotionally abused: Not on file    Physically abused: Not on file    Forced sexual activity: Not on file  Other Topics Concern  . Not on file  Social History Narrative  . Not on file  Recently moved from [place], [place] to [place]. Nonsmoker minimal social alcohol use no drug use Currently unemployed Has a 61 year old daughter  FAMILY HISTORY: No known history of blood disorders Mother had a history of cervical cancer  maternal grandmother diabetes type 2 Maternal uncle diabetes type 2  ALLERGIES:  is allergic to dextromethorphan and doxylamine succinate (sleep).  MEDICATIONS:  Current Outpatient Medications  Medication Sig Dispense Refill  . Cholecalciferol (VITAMIN D3 PO) Take 1 capsule by mouth daily.    . Cyanocobalamin (B-12) 1000 MCG SUBL Place 1,000 mcg under the tongue daily. 30 each 3  . eltrombopag (PROMACTA) 25 MG tablet Take 1 tablet (25 mg total) by mouth daily. Take  on am empty stomach 1 hr before or 2 hr after food. Separate from iron supplement. 30 tablet 2  . iron polysaccharides (NIFEREX) 150 MG capsule Take 1 capsule (150 mg total) by mouth 2 (two) times daily.    Marland Kitchen levothyroxine (SYNTHROID, LEVOTHROID) 100 MCG tablet Take 1 tablet (100 mcg total) by mouth daily before breakfast. 30 tablet 0  . Multiple Vitamin (MULTIVITAMIN) tablet Take 1 tablet by mouth daily.     No current facility-administered medications for this visit.     REVIEW OF SYSTEMS:    A 10+ POINT REVIEW OF SYSTEMS WAS OBTAINED including neurology, dermatology, psychiatry, cardiac, respiratory, lymph, extremities, GI, GU, Musculoskeletal, constitutional, breasts, reproductive, HEENT.  All pertinent positives are noted in the HPI.  All others are negative.  PHYSICAL EXAMINATION: ECOG PERFORMANCE STATUS: 1 - Symptomatic but completely ambulatory  Vitals:   01/26/19 1004  BP: (!) 157/92  Pulse: 94  Resp: 18  Temp: 98.2 F (36.8 C)  SpO2: 100%   Filed Weights   01/26/19 1004  Weight: (!) 343 lb 12.8 oz (155.9 kg)   .Body mass index is 62.88 kg/m.   GENERAL:alert, in no acute distress and comfortable SKIN: no acute rashes, no significant lesions EYES: conjunctiva are pink and non-injected, sclera anicteric OROPHARYNX: MMM, no exudates, no oropharyngeal erythema or ulceration NECK: supple, no JVD LYMPH:  no palpable lymphadenopathy in the cervical, axillary or inguinal regions LUNGS: clear to auscultation b/l with normal respiratory effort HEART: regular rate & rhythm ABDOMEN:  normoactive bowel sounds , non tender, not distended. No palpable hepatosplenomegaly.  Extremity: no pedal edema PSYCH: alert & oriented x 3 with fluent speech NEURO: no focal motor/sensory deficits   LABORATORY DATA:  I have reviewed the data as listed  . CBC Latest Ref Rng & Units 01/26/2019 11/27/2018 09/25/2018  WBC 4.0 - 10.5 K/uL 8.9 6.7 7.2  Hemoglobin 12.0 - 15.0 g/dL 12.2 12.4 11.3(L)   Hematocrit 36.0 - 46.0 % 37.1 38.7 35.9(L)  Platelets 150 - 400 K/uL 63(L) 85(L) 71(L)    CMP Latest Ref Rng & Units 01/26/2019 11/27/2018 09/25/2018  Glucose 70 - 99 mg/dL 132(H) 137(H) 143(H)  BUN 6 - 20 mg/dL 9 8 10   Creatinine 0.44 - 1.00 mg/dL 0.76 0.82 0.73  Sodium 135 - 145 mmol/L 138 138 140  Potassium 3.5 - 5.1 mmol/L 3.8 4.2 3.7  Chloride 98 - 111 mmol/L 103 103 105  CO2 22 - 32 mmol/L 25 25 24   Calcium 8.9 - 10.3 mg/dL 8.6(L) 8.8(L) 8.7(L)  Total Protein 6.5 - 8.1 g/dL 7.2 7.3 6.7  Total Bilirubin 0.3 - 1.2 mg/dL 0.4 0.5 0.4  Alkaline Phos 38 - 126 U/L 93 94 81  AST 15 - 41 U/L 20 20 16   ALT 0 - 44 U/L 22 21 15    . Lab Results  Component Value Date   IRON 72 11/27/2018   TIBC 394 11/27/2018   IRONPCTSAT 18 (L) 11/27/2018   (Iron and TIBC)  Lab Results  Component Value Date   FERRITIN 84 01/26/2019    RADIOGRAPHIC STUDIES: I have personally reviewed the radiological images as listed and agreed with the findings in the report. No results found.  ASSESSMENT & PLAN:   43 y.o. African-American female with   1) Chronic ITP since 2012 now with acute relapse of her ITP with very labile platelet counts. No overt bleeding at this time. Patient has been on and off steroids and has been on Promacta since 2016. Was apparently on Promacta 25mg  po MWF. No issues with bleeding at this time. No petechiae B12 wnl. Ultrasound abdomen 06/02/2016- did show some mild splenomegaly which could be an additional factor in her thrombocytopenia. The spleen measures 12.9 x 13.9 x 6.7 cm. The calculated volume is 628 cc. Unclear etiology of splenomegaly. Liver appeared normal.  Patient's platelet counts have normalized after treatment with Rituxan weekly 4 doses with platelets improved temporarily but she needed additional treatment.  PLAN:  -Discussed pt labwork today, 01/26/19; PLT at 63k, HGB remains normal at 12.2 with MCV at 76.2. Chemistries are stable. -01/26/19 Ferritin is 84  -The pt has no prohibitive toxicities from continuing 25mg  Promacta at this time. -Goal for PLT >50k -Continue 150mg  Iron Polysaccharide BID -If counts continue to be stable/increase  further, will consider dropping Promacta on the weekends. -Advised pt to absolutely avoid NSAIDS, ASA and similar products -Patient was again counseled on importance of weight loss and remaining physically active. I recommend 10,000 steps a day with a balanced healthy diet. Also recommend minimizing free sugars. Could also consider interval fasting. -Recommended that the patient pursue a sleep study with PCP for possible OSA -Counseled the patient regarding the importance of medical follow up given, especially as she is taking Promacta and needs regular monitoring and toxicity checks -Will see the pt back in 10 weeks  2) Iron deficiency Anemia - likely from heavy menstrual losses. -Tolerating the po iron without any significant issues. She previously was not taking PO iron for a period of time because she states that she forgot to pick up her medication. I previously encouraged her to be more compliant with her medication to improve her iron levels to avoid IV iron. She understands and is agreeable.   Lab Results  Component Value Date   FERRITIN 84 01/26/2019   Plan  -Continue oral iron replacement with iron polysaccharide 150 mg by mouth twice a day till ferritin close to 100 -Her periods/menorrhagia remain overall stable.   3)  Hypothyroidism - on levothyroxine As per primary care physician -continue Mx per PCP  4) . Patient Active Problem List   Diagnosis Date Noted  . Body mass index (BMI) of 50.0 to 59.9 in adult [facility])   . BRBPR (bright red blood per rectum)   . Polyp of descending colon   . Encntr for gyn exam (general) (routine) w/o abn findings 10/13/2016  . Exposure to STD 10/13/2016  . Chronic ITP (idiopathic thrombocytopenia) (HCC) 05/03/2016  . Thrombocytopenia ([place]) 04/19/2016  . Iron  deficiency anemia 04/19/2016  . Hypothyroidism 04/19/2016  . Degenerative arthritis 04/19/2016  . Generalized anxiety disorder 04/19/2016  -continue f/u with PCP  5) Obesity  -discussed lifestyle modifications including need for appropriate diet and exercise. Encouraged her to take at least 10,000 steps a day and have a balanced healthy diet.  6) H/o PE in 2004-- still has IVC filter in situ. Thought to be triggered by immobility and obesity. -not on anticoagulation at this time.   RTC with Dr Irene Limbo with labs in 10 weeks   All of the patients questions were answered with apparent satisfaction. The patient knows to call the clinic with any problems, questions or concerns.  The total time spent in the appt was 20 minutes and more than 50% was on counseling and direct patient cares.   Sullivan Lone MD [place] [facility] Hematology/Oncology [facility]  (Office):       [phone] (Work cell):  [phone] (Fax):           [phone]  I, Baldwin [place], am acting as a scribe for Dr. Sullivan Lone.   .I have reviewed the above documentation for accuracy and completeness, and I agree with the above. Brunetta Genera MD

## 2019-01-26 ENCOUNTER — Inpatient Hospital Stay: Payer: Medicaid Other | Attending: Hematology

## 2019-01-26 ENCOUNTER — Other Ambulatory Visit: Payer: Self-pay

## 2019-01-26 ENCOUNTER — Inpatient Hospital Stay (HOSPITAL_BASED_OUTPATIENT_CLINIC_OR_DEPARTMENT_OTHER): Payer: Medicaid Other | Admitting: Hematology

## 2019-01-26 ENCOUNTER — Telehealth: Payer: Self-pay | Admitting: Hematology

## 2019-01-26 VITALS — BP 157/92 | HR 94 | Temp 98.2°F | Resp 18 | Ht 62.0 in | Wt 343.8 lb

## 2019-01-26 DIAGNOSIS — Z79899 Other long term (current) drug therapy: Secondary | ICD-10-CM | POA: Diagnosis not present

## 2019-01-26 DIAGNOSIS — E669 Obesity, unspecified: Secondary | ICD-10-CM

## 2019-01-26 DIAGNOSIS — Z560 Unemployment, unspecified: Secondary | ICD-10-CM | POA: Insufficient documentation

## 2019-01-26 DIAGNOSIS — D509 Iron deficiency anemia, unspecified: Secondary | ICD-10-CM

## 2019-01-26 DIAGNOSIS — Z6841 Body Mass Index (BMI) 40.0 and over, adult: Secondary | ICD-10-CM | POA: Diagnosis not present

## 2019-01-26 DIAGNOSIS — D693 Immune thrombocytopenic purpura: Secondary | ICD-10-CM | POA: Insufficient documentation

## 2019-01-26 DIAGNOSIS — Z86711 Personal history of pulmonary embolism: Secondary | ICD-10-CM | POA: Diagnosis not present

## 2019-01-26 DIAGNOSIS — E039 Hypothyroidism, unspecified: Secondary | ICD-10-CM

## 2019-01-26 DIAGNOSIS — F419 Anxiety disorder, unspecified: Secondary | ICD-10-CM

## 2019-01-26 DIAGNOSIS — F411 Generalized anxiety disorder: Secondary | ICD-10-CM | POA: Insufficient documentation

## 2019-01-26 LAB — CBC WITH DIFFERENTIAL/PLATELET
Abs Immature Granulocytes: 0.1 10*3/uL — ABNORMAL HIGH (ref 0.00–0.07)
Basophils Absolute: 0 10*3/uL (ref 0.0–0.1)
Basophils Relative: 0 %
Eosinophils Absolute: 0.3 10*3/uL (ref 0.0–0.5)
Eosinophils Relative: 4 %
HCT: 37.1 % (ref 36.0–46.0)
Hemoglobin: 12.2 g/dL (ref 12.0–15.0)
Immature Granulocytes: 1 %
Lymphocytes Relative: 13 %
Lymphs Abs: 1.2 10*3/uL (ref 0.7–4.0)
MCH: 25.1 pg — ABNORMAL LOW (ref 26.0–34.0)
MCHC: 32.9 g/dL (ref 30.0–36.0)
MCV: 76.2 fL — ABNORMAL LOW (ref 80.0–100.0)
Monocytes Absolute: 0.7 10*3/uL (ref 0.1–1.0)
Monocytes Relative: 7 %
Neutro Abs: 6.6 10*3/uL (ref 1.7–7.7)
Neutrophils Relative %: 75 %
Platelets: 63 10*3/uL — ABNORMAL LOW (ref 150–400)
RBC: 4.87 MIL/uL (ref 3.87–5.11)
RDW: 13.9 % (ref 11.5–15.5)
WBC: 8.9 10*3/uL (ref 4.0–10.5)
nRBC: 0 % (ref 0.0–0.2)

## 2019-01-26 LAB — CMP (CANCER CENTER ONLY)
ALT: 22 U/L (ref 0–44)
AST: 20 U/L (ref 15–41)
Albumin: 3.5 g/dL (ref 3.5–5.0)
Alkaline Phosphatase: 93 U/L (ref 38–126)
Anion gap: 10 (ref 5–15)
BUN: 9 mg/dL (ref 6–20)
CO2: 25 mmol/L (ref 22–32)
Calcium: 8.6 mg/dL — ABNORMAL LOW (ref 8.9–10.3)
Chloride: 103 mmol/L (ref 98–111)
Creatinine: 0.76 mg/dL (ref 0.44–1.00)
GFR, Est AFR Am: >60 mL/min (ref >60)
GFR, Estimated: >60 mL/min (ref >60)
Glucose, Bld: 132 mg/dL — ABNORMAL HIGH (ref 70–99)
Potassium: 3.8 mmol/L (ref 3.5–5.1)
Sodium: 138 mmol/L (ref 135–145)
Total Bilirubin: 0.4 mg/dL (ref 0.3–1.2)
Total Protein: 7.2 g/dL (ref 6.5–8.1)

## 2019-01-26 LAB — FERRITIN: Ferritin: 84 ng/mL (ref 11–307)

## 2019-01-26 NOTE — Telephone Encounter (Signed)
Scheduled appt per 7/10 los.  Spoke with patient and she is aware of her appt date and time.

## 2019-02-08 MED FILL — PROMACTA 25 MG TABLET: 25 | 30 days supply | Qty: 30 | Fill #2

## 2019-02-19 IMAGING — DX DG CERVICAL SPINE COMPLETE 4+V
7 series · 7 of 7 positions shown · non-contrast
Comparison: None.

CLINICAL DATA: Neck pain and right hand tingling for several
months.

EXAM:
CERVICAL SPINE - COMPLETE 4+ VIEW

[dg cervical spine complete (1 of 7)]
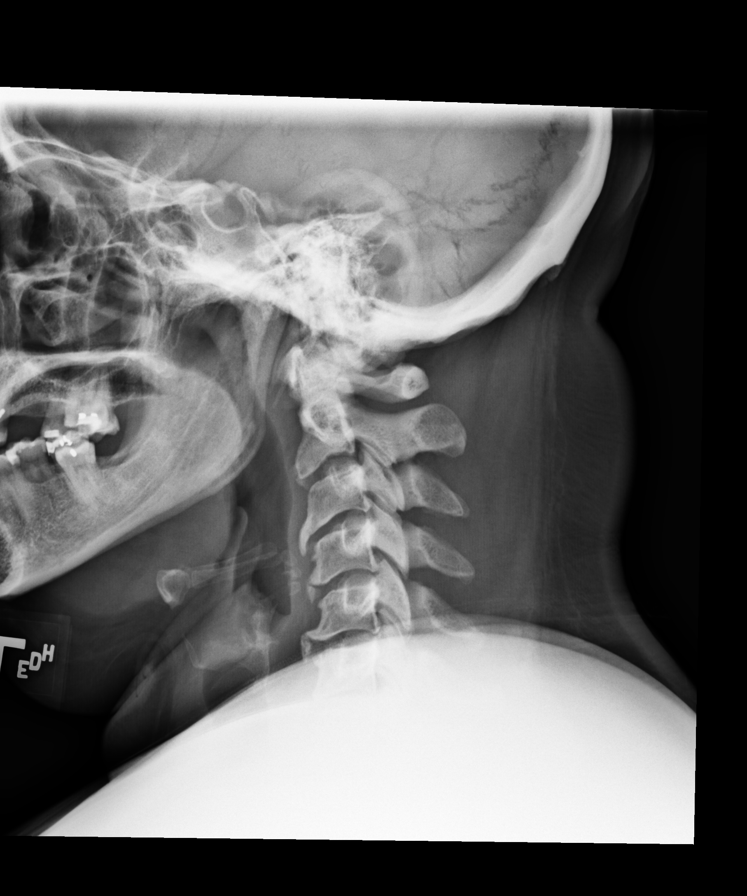

[dg cervical spine complete (2 of 7)]
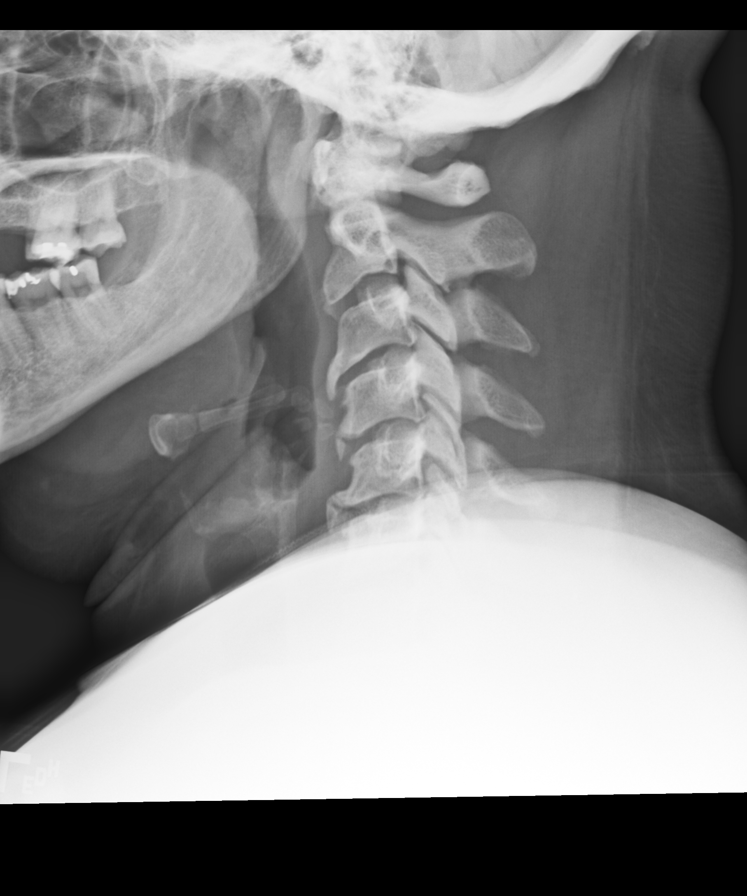

[dg cervical spine complete (3 of 7)]
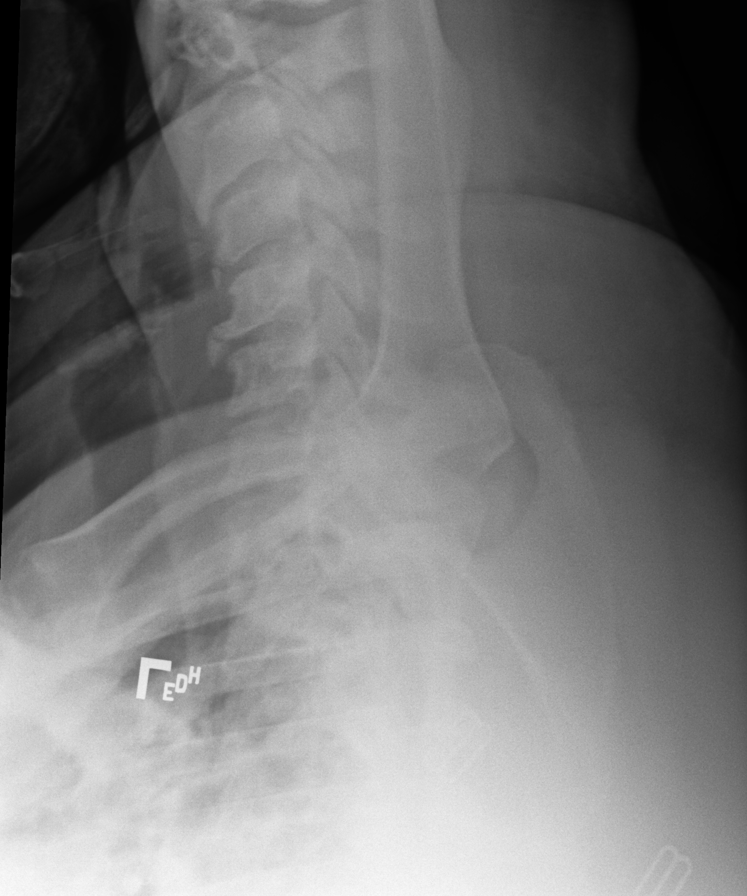

[dg cervical spine complete (4 of 7)]
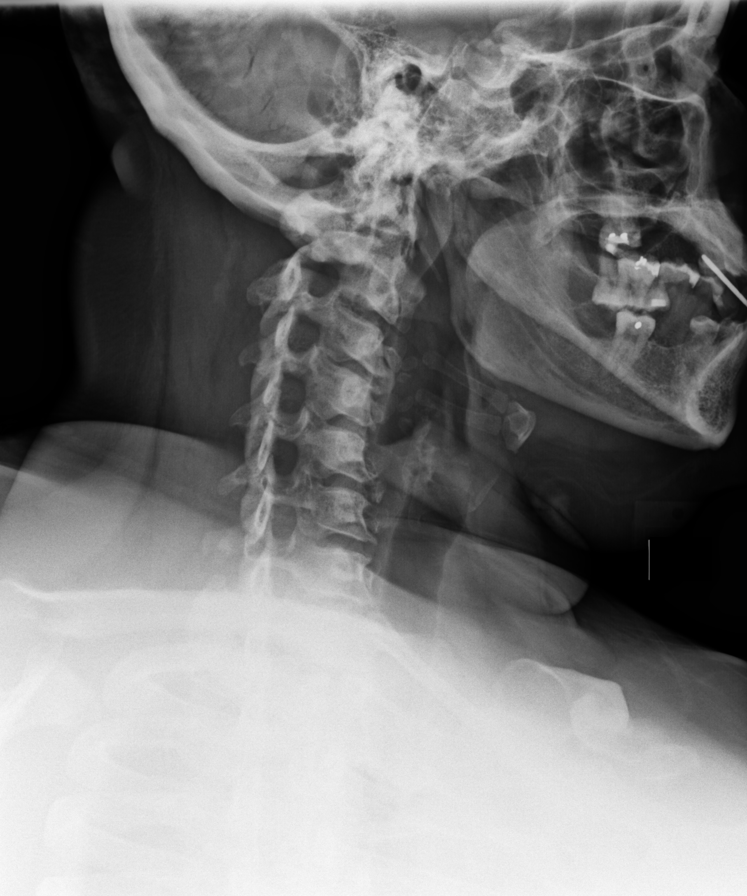

[dg cervical spine complete (5 of 7)]
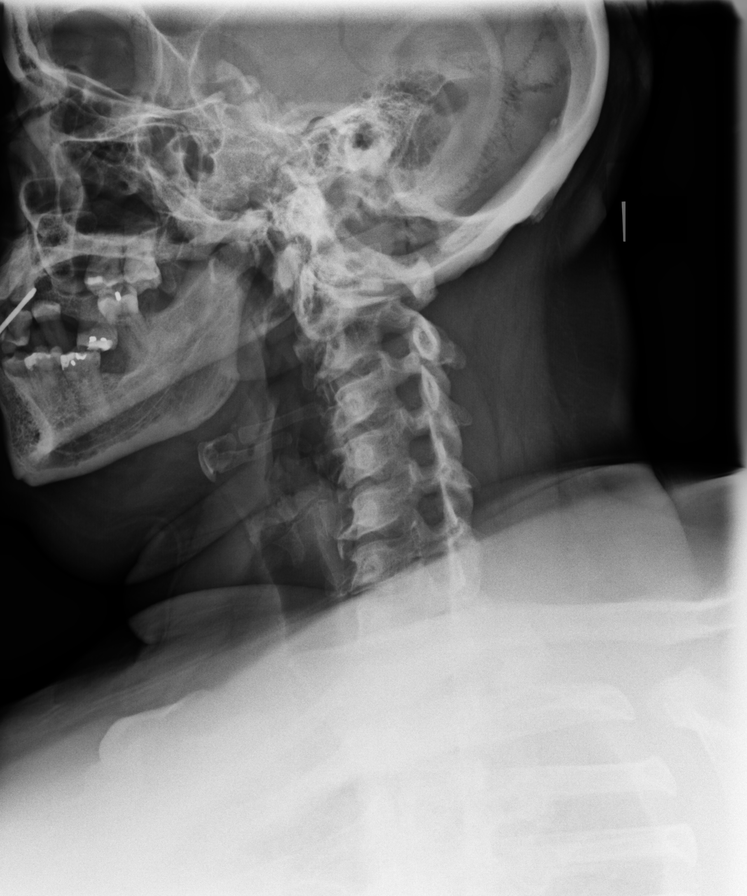

[dg cervical spine complete (6 of 7)]
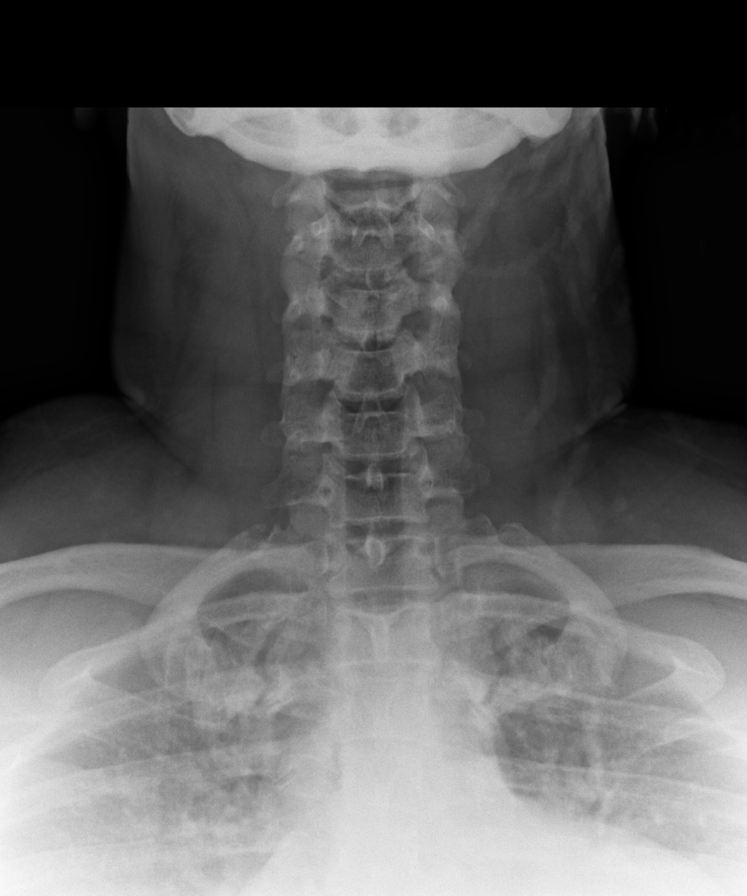

[dg cervical spine complete (7 of 7)]
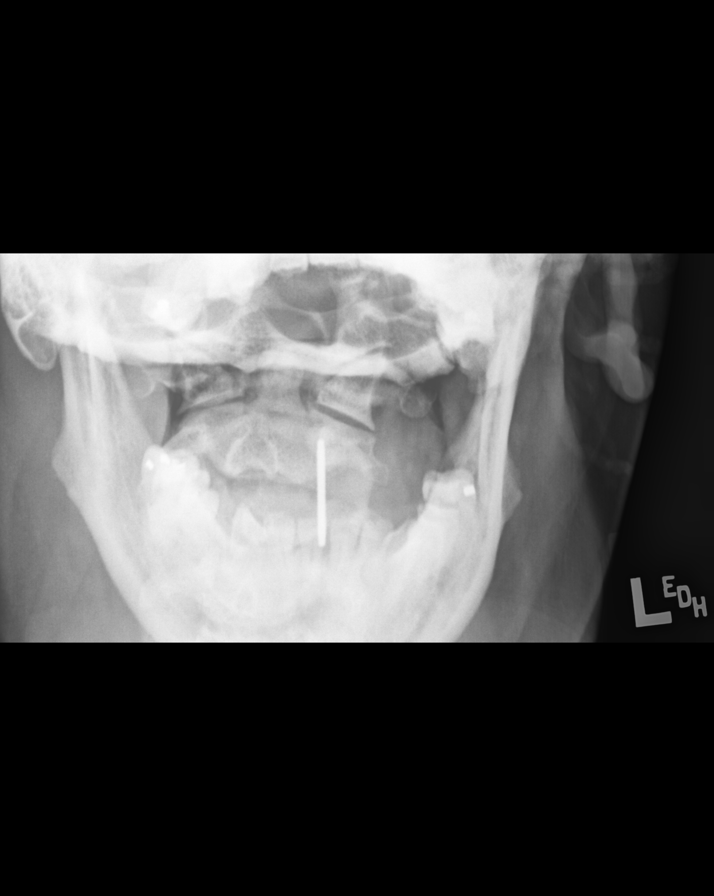

[7 of 7 positions shown; findings below may reference images not displayed]

FINDINGS: Mild reversal of normal lordosis of cervical spine is noted which
most likely is positional in origin. No fracture or
spondylolisthesis is noted. Moderate anterior osteophyte formation
is noted at C3-4, C4-5 and C5-6. No significant neural foraminal
stenosis is noted.
IMPRESSION: Degenerative changes as described above. No acute abnormality seen
in the cervical spine.

## 2019-03-13 ENCOUNTER — Other Ambulatory Visit: Payer: Self-pay | Admitting: Hematology

## 2019-03-13 DIAGNOSIS — D693 Immune thrombocytopenic purpura: Secondary | ICD-10-CM

## 2019-03-13 MED FILL — PROMACTA 25 MG TABLET: 25 | 30 days supply | Qty: 30 | Fill #0

## 2019-04-08 NOTE — Progress Notes (Signed)
HEMATOLOGY/ONCOLOGY CLINIC NOTE  Date of Service: 04/10/19  PCP: [facility]:  F/u for continued management of ITP  HISTORY OF PRESENTING ILLNESS:   Kiara Barrera is a wonderful 43 y.o. 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  Patient was been managed for her chronic ITP by Dr Yvone Neu at [facility] in [place], [place] and recently moved to [place] and wants to establish care here.  As per review of outside records the patient was diagnosed with ITP since 2012 in [place] and has been treated with prednisone on off since then. She apparently had significant weight gain with prednisone and had chronically low platelets despite the prednisone. She was then started on Promacta 50 mg by mouth daily in June 2016 and had a very good response with platelet counts of more than 500k. Her Promacta was discontinued in July 2016. She had relapse of her ITP in September 2016 with platelet count was down to 49k any significant bleeding or bruising. He has been on variable doses of Promacta since then and reports that she is currently taking 25 mg on Monday Wednesday and Friday.  He has had previous iron deficiency due to heavy periods which was treated with oral iron replacement.  INTERVAL HISTORY   [name] is here for evaluation and management of her chronic ITP and iron deficiency anemia. The patient's last visit with [place] was on 01/26/2019. The pt reports that she is doing well overall.  The pt reports some distress about having to continue to receive treatment but states that she does not have any specific issues with her medication. Pt has been dealing with her daughter learning from home during her last year of high school and applying to college. Pt has been taking her iron  pills as prescribed. She reports that she has not had a menstrual cycle in two months but has experienced spotting. She denies the possibility that she could be pregnant. Pt has not been walking outside of her home and states that it's because she has gotten comfortable with staying inside during quarantine.   Lab results today (04/10/19) of CBC w/diff and CMP is as follows: all values are WNL except for RBC at 5.18, MCV at 75.3, MCH at 25.1, PLTs at 80K, Glucose at 162, Calcium at 8.7.  On review of systems, pt and denies abdominal pain, bleeding issues and any other symptoms.   MEDICAL HISTORY:  #1 chronic ITP #2 Iron deficiency anemia due to heavy periods #3 hypothyroidism #4 degenerative arthritis #5 anxiety disorder #6 history of pulmonary embolism in 2004 status post IVC filter placement. 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 #7 migraine headaches  SURGICAL HISTORY: Past Surgical History:  Procedure Laterality Date  . C secton  2002  . COLONOSCOPY WITH PROPOFOL N/A 02/07/2018   Procedure: COLONOSCOPY WITH PROPOFOL;  Surgeon: Mauri Pole, MD;  Location: [facility];  Service: Endoscopy;  Laterality: N/A;  . IVC FILTER PLACEMENT ([facility])  2004  . Left axillary lymph node excision biopsy  2008  . POLYPECTOMY  02/07/2018   Procedure: POLYPECTOMY;  Surgeon: Mauri Pole, MD;  Location: [facility];  Service: Endoscopy;;  Clips Placed  SOCIAL HISTORY: Social History   Socioeconomic History  . Marital status: Single    Spouse name: Not on file  . Number of children: Not on file  . Years of education: Not on file  . Highest education level: Not on file  Occupational History  . Not on file  Social Needs  . Financial resource strain: Not on file  . Food insecurity    Worry: Not on file    Inability: Not on file  . Transportation needs    Medical: Not on  file    Non-medical: Not on file  Tobacco Use  . Smoking status: Never Smoker  . Smokeless tobacco: Never Used  Substance and Sexual Activity  . Alcohol use: Yes    Comment: occasional: not that often  . Drug use: No  . Sexual activity: Not on file  Lifestyle  . Physical activity    Days per week: Not on file    Minutes per session: Not on file  . Stress: Not on file  Relationships  . Social Herbalist on phone: Not on file    Gets together: Not on file    Attends religious service: Not on file    Active member of club or organization: Not on file    Attends meetings of clubs or organizations: Not on file    Relationship status: Not on file  . Intimate partner violence    Fear of current or ex partner: Not on file    Emotionally abused: Not on file    Physically abused: Not on file    Forced sexual activity: Not on file  Other Topics Concern  . Not on file  Social History Narrative  . Not on file  Recently moved from [place], [place] to [place]. Nonsmoker minimal social alcohol use no drug use Currently unemployed Has a 4 year old daughter  FAMILY HISTORY: No known history of blood disorders Mother had a history of cervical cancer  maternal grandmother diabetes type 2 Maternal uncle diabetes type 2  ALLERGIES:  is allergic to dextromethorphan and doxylamine succinate (sleep).  MEDICATIONS:  Current Outpatient Medications  Medication Sig Dispense Refill  . Cholecalciferol (VITAMIN D3 PO) Take 1 capsule by mouth daily.    . Cyanocobalamin (B-12) 1000 MCG SUBL Place 1,000 mcg under the tongue daily. 30 each 3  . iron polysaccharides (NIFEREX) 150 MG capsule Take 1 capsule (150 mg total) by mouth 2 (two) times daily.    Marland Kitchen levothyroxine (SYNTHROID, LEVOTHROID) 100 MCG tablet Take 1 tablet (100 mcg total) by mouth daily before breakfast. 30 tablet 0  . Multiple Vitamin (MULTIVITAMIN) tablet Take 1 tablet by mouth daily.    Marland Kitchen PROMACTA 25 MG tablet TAKE 1  TABLET (25 MG TOTAL) BY MOUTH DAILY. TAKE ON AM EMPTY STOMACH 1 HR BEFORE OR 2 HR AFTER FOOD. SEPARATE FROM IRON SUPPLEMENT. 30 tablet 2   No current facility-administered medications for this visit.     REVIEW OF SYSTEMS:    A 10+ POINT REVIEW OF SYSTEMS WAS OBTAINED including neurology, dermatology, psychiatry, cardiac, respiratory, lymph, extremities, GI, GU, Musculoskeletal, constitutional, breasts, reproductive, HEENT.  All pertinent positives are noted in the HPI.  All others are negative.    PHYSICAL EXAMINATION: ECOG PERFORMANCE STATUS: 1 - Symptomatic but completely ambulatory  Vitals:   04/10/19 0937  BP: (!) 139/96  Pulse: 89  Resp: 18  Temp: 97.9 F (36.6 C)  SpO2: 99%   Filed Weights   04/10/19 [id number]  Weight: (!) 347 lb 9.6 oz (157.7 kg)   .Body mass index is 63.58 kg/m.    GENERAL:alert, in no acute distress and comfortable SKIN: no acute rashes, no significant lesions EYES: conjunctiva are pink and non-injected, sclera anicteric OROPHARYNX: MMM, no exudates, no oropharyngeal erythema or ulceration NECK: supple, no JVD LYMPH:  no palpable lymphadenopathy in the cervical, axillary or inguinal regions LUNGS: clear to auscultation b/l with normal respiratory effort HEART: regular rate & rhythm ABDOMEN:  normoactive bowel sounds , non tender, not distended. No palpable hepatosplenomegaly.  Extremity: no pedal edema PSYCH: alert & oriented x 3 with fluent speech NEURO: no focal motor/sensory deficits  LABORATORY DATA:  I have reviewed the data as listed  . CBC Latest Ref Rng & Units 04/10/2019 01/26/2019 11/27/2018  WBC 4.0 - 10.5 K/uL 7.1 8.9 6.7  Hemoglobin 12.0 - 15.0 g/dL 13.0 12.2 12.4  Hematocrit 36.0 - 46.0 % 39.0 37.1 38.7  Platelets 150 - 400 K/uL 80(L) 63(L) 85(L)    CMP Latest Ref Rng & Units 01/26/2019 11/27/2018 09/25/2018  Glucose 70 - 99 mg/dL 132(H) 137(H) 143(H)  BUN 6 - 20 mg/dL 9 8 10   Creatinine 0.44 - 1.00 mg/dL 0.76 0.82 0.73  Sodium 135  - 145 mmol/L 138 138 140  Potassium 3.5 - 5.1 mmol/L 3.8 4.2 3.7  Chloride 98 - 111 mmol/L 103 103 105  CO2 22 - 32 mmol/L 25 25 24   Calcium 8.9 - 10.3 mg/dL 8.6(L) 8.8(L) 8.7(L)  Total Protein 6.5 - 8.1 g/dL 7.2 7.3 6.7  Total Bilirubin 0.3 - 1.2 mg/dL 0.4 0.5 0.4  Alkaline Phos 38 - 126 U/L 93 94 81  AST 15 - 41 U/L 20 20 16   ALT 0 - 44 U/L 22 21 15    . Lab Results  Component Value Date   IRON 72 11/27/2018   TIBC 394 11/27/2018   IRONPCTSAT 18 (L) 11/27/2018   (Iron and TIBC)  Lab Results  Component Value Date   FERRITIN 84 01/26/2019    RADIOGRAPHIC STUDIES: I have personally reviewed the radiological images as listed and agreed with the findings in the report. No results found.  ASSESSMENT & PLAN:   43 y.o. African-American female with   1) Chronic ITP since 2012 now with acute relapse of her ITP with very labile platelet counts. No overt bleeding at this time. Patient has been on and off steroids and has been on Promacta since 2016. Was apparently on Promacta 25mg  po MWF. No issues with bleeding at this time. No petechiae B12 wnl. Ultrasound abdomen 06/02/2016- did show some mild splenomegaly which could be an additional factor in her thrombocytopenia. The spleen measures 12.9 x 13.9 x 6.7 cm. The calculated volume is 628 cc. Unclear etiology of splenomegaly. Liver appeared normal.  Patient's platelet counts have normalized after treatment with Rituxan weekly 4 doses with platelets improved temporarily but she needed additional treatment.  PLAN:  -Discussed pt labwork today, 04/10/19; all values are WNL except for RBC at 5.18, MCV at 75.3, MCH at 25.1, PLTs at 80K, Glucose at 162, Calcium at 8.7. -Continue taking Promacta 25 mg daily  -The pt has no prohibitive toxicities from continuing 25mg  Promacta at this time -If PLT counts continue to be stable/increase further, will consider lowering dose of Promacta  -Goal for PLT >50k -Continue 150mg  Iron  Polysaccharide BID -Advised pt that weight loss could help increase her PLTs -Goal of 4,000 steps per day for the first month and then increase to  6000 steps a day. -Advised pt to absolutely avoid NSAIDS, ASA and similar products -Will see the pt back in 3 months with labs  2) Iron deficiency Anemia - likely from heavy menstrual losses. -Tolerating the po iron without any significant issues. She previously was not taking PO iron for a period of time because she states that she forgot to pick up her medication. I previously encouraged her to be more compliant with her medication to improve her iron levels to avoid IV iron. She understands and is agreeable.   Lab Results  Component Value Date   FERRITIN 84 01/26/2019   Plan  -Continue oral iron replacement with iron polysaccharide 150 mg by mouth twice a day till ferritin close to 100 -Her periods/menorrhagia remain overall stable.   3)  Hypothyroidism - on levothyroxine As per primary care physician -continue Mx per PCP  4) . Patient Active Problem List   Diagnosis Date Noted  . Body mass index (BMI) of 50.0 to 59.9 in adult [facility])   . BRBPR (bright red blood per rectum)   . Polyp of descending colon   . Encntr for gyn exam (general) (routine) w/o abn findings 10/13/2016  . Exposure to STD 10/13/2016  . Chronic ITP (idiopathic thrombocytopenia) (HCC) 05/03/2016  . Thrombocytopenia ([facility]) 04/19/2016  . Iron deficiency anemia 04/19/2016  . Hypothyroidism 04/19/2016  . Degenerative arthritis 04/19/2016  . Generalized anxiety disorder 04/19/2016  -continue f/u with PCP  5) Obesity  -discussed lifestyle modifications including need for appropriate diet and exercise. Encouraged her to take at least 10,000 steps a day and have a balanced healthy diet.  6) H/o PE in 2004-- still has IVC filter in situ. Thought to be triggered by immobility and obesity. -not on anticoagulation at this time.  FOLLOW UP: RTC with Dr Irene Limbo with labs in 3  months   The total time spent in the appt was 15 minutes and more than 50% was on counseling and direct patient cares.  All of the patient's questions were answered with apparent satisfaction. The patient knows to call the clinic with any problems, questions or concerns.   Sullivan Lone MD [place] AAHIVMS [facility] Hematology/Oncology Physician [facility]  (Office):       [phone] (Work cell):  [phone] (Fax):           [phone]  I, Yevette Edwards, am acting as a scribe for Dr. Sullivan Lone.   .I have reviewed the above documentation for accuracy and completeness, and I agree with the above. Brunetta Genera MD

## 2019-04-10 ENCOUNTER — Telehealth: Payer: Self-pay | Admitting: Hematology

## 2019-04-10 ENCOUNTER — Other Ambulatory Visit: Payer: Self-pay

## 2019-04-10 ENCOUNTER — Inpatient Hospital Stay: Payer: Medicaid Other | Attending: Hematology

## 2019-04-10 ENCOUNTER — Inpatient Hospital Stay (HOSPITAL_BASED_OUTPATIENT_CLINIC_OR_DEPARTMENT_OTHER): Payer: Medicaid Other | Admitting: Hematology

## 2019-04-10 VITALS — BP 139/96 | HR 89 | Temp 97.9°F | Resp 18 | Ht 62.0 in | Wt 347.6 lb

## 2019-04-10 DIAGNOSIS — F419 Anxiety disorder, unspecified: Secondary | ICD-10-CM | POA: Insufficient documentation

## 2019-04-10 DIAGNOSIS — D509 Iron deficiency anemia, unspecified: Secondary | ICD-10-CM | POA: Diagnosis not present

## 2019-04-10 DIAGNOSIS — Z6841 Body Mass Index (BMI) 40.0 and over, adult: Secondary | ICD-10-CM | POA: Diagnosis not present

## 2019-04-10 DIAGNOSIS — Z833 Family history of diabetes mellitus: Secondary | ICD-10-CM | POA: Insufficient documentation

## 2019-04-10 DIAGNOSIS — M1990 Unspecified osteoarthritis, unspecified site: Secondary | ICD-10-CM | POA: Insufficient documentation

## 2019-04-10 DIAGNOSIS — E039 Hypothyroidism, unspecified: Secondary | ICD-10-CM | POA: Insufficient documentation

## 2019-04-10 DIAGNOSIS — E669 Obesity, unspecified: Secondary | ICD-10-CM | POA: Insufficient documentation

## 2019-04-10 DIAGNOSIS — D693 Immune thrombocytopenic purpura: Secondary | ICD-10-CM | POA: Diagnosis not present

## 2019-04-10 DIAGNOSIS — D696 Thrombocytopenia, unspecified: Secondary | ICD-10-CM | POA: Diagnosis not present

## 2019-04-10 DIAGNOSIS — Z86711 Personal history of pulmonary embolism: Secondary | ICD-10-CM | POA: Insufficient documentation

## 2019-04-10 DIAGNOSIS — Z8049 Family history of malignant neoplasm of other genital organs: Secondary | ICD-10-CM | POA: Diagnosis not present

## 2019-04-10 DIAGNOSIS — N920 Excessive and frequent menstruation with regular cycle: Secondary | ICD-10-CM | POA: Diagnosis not present

## 2019-04-10 DIAGNOSIS — G43909 Migraine, unspecified, not intractable, without status migrainosus: Secondary | ICD-10-CM | POA: Insufficient documentation

## 2019-04-10 DIAGNOSIS — Z79899 Other long term (current) drug therapy: Secondary | ICD-10-CM | POA: Diagnosis not present

## 2019-04-10 LAB — CBC WITH DIFFERENTIAL/PLATELET
Abs Immature Granulocytes: 0.04 10*3/uL (ref 0.00–0.07)
Basophils Absolute: 0 10*3/uL (ref 0.0–0.1)
Basophils Relative: 1 %
Eosinophils Absolute: 0.1 10*3/uL (ref 0.0–0.5)
Eosinophils Relative: 2 %
HCT: 39 % (ref 36.0–46.0)
Hemoglobin: 13 g/dL (ref 12.0–15.0)
Immature Granulocytes: 1 %
Lymphocytes Relative: 17 %
Lymphs Abs: 1.2 10*3/uL (ref 0.7–4.0)
MCH: 25.1 pg — ABNORMAL LOW (ref 26.0–34.0)
MCHC: 33.3 g/dL (ref 30.0–36.0)
MCV: 75.3 fL — ABNORMAL LOW (ref 80.0–100.0)
Monocytes Absolute: 0.6 10*3/uL (ref 0.1–1.0)
Monocytes Relative: 9 %
Neutro Abs: 5 10*3/uL (ref 1.7–7.7)
Neutrophils Relative %: 70 %
Platelets: 80 10*3/uL — ABNORMAL LOW (ref 150–400)
RBC: 5.18 MIL/uL — ABNORMAL HIGH (ref 3.87–5.11)
RDW: 13.7 % (ref 11.5–15.5)
WBC: 7.1 10*3/uL (ref 4.0–10.5)
nRBC: 0 % (ref 0.0–0.2)

## 2019-04-10 LAB — CMP (CANCER CENTER ONLY)
ALT: 14 U/L (ref 0–44)
AST: 15 U/L (ref 15–41)
Albumin: 3.7 g/dL (ref 3.5–5.0)
Alkaline Phosphatase: 92 U/L (ref 38–126)
Anion gap: 10 (ref 5–15)
BUN: 11 mg/dL (ref 6–20)
CO2: 24 mmol/L (ref 22–32)
Calcium: 8.7 mg/dL — ABNORMAL LOW (ref 8.9–10.3)
Chloride: 105 mmol/L (ref 98–111)
Creatinine: 0.72 mg/dL (ref 0.44–1.00)
GFR, Est AFR Am: >60 mL/min (ref >60)
GFR, Estimated: >60 mL/min (ref >60)
Glucose, Bld: 162 mg/dL — ABNORMAL HIGH (ref 70–99)
Potassium: 3.9 mmol/L (ref 3.5–5.1)
Sodium: 139 mmol/L (ref 135–145)
Total Bilirubin: 0.5 mg/dL (ref 0.3–1.2)
Total Protein: 7.1 g/dL (ref 6.5–8.1)

## 2019-04-10 NOTE — Telephone Encounter (Signed)
Scheduled appt per 9/22 los.  Left a voice message of appt date and time.

## 2019-04-11 MED FILL — PROMACTA 25 MG TABLET: 25 | 30 days supply | Qty: 30 | Fill #1

## 2019-05-07 MED FILL — PROMACTA 25 MG TABLET: 25 | 30 days supply | Qty: 30 | Fill #2

## 2019-06-04 ENCOUNTER — Other Ambulatory Visit: Payer: Self-pay | Admitting: Hematology

## 2019-06-04 DIAGNOSIS — D693 Immune thrombocytopenic purpura: Secondary | ICD-10-CM

## 2019-06-07 MED FILL — PROMACTA 25 MG TABLET: 25 | 30 days supply | Qty: 30 | Fill #0

## 2019-07-09 MED FILL — PROMACTA 25 MG TABLET: 25 | 30 days supply | Qty: 30 | Fill #1

## 2019-07-09 NOTE — Progress Notes (Signed)
HEMATOLOGY/ONCOLOGY CLINIC NOTE  Date of Service: 07/10/19  PCP: [place]:  F/u for continued management of ITP  HISTORY OF PRESENTING ILLNESS:   Tanith Swinton Mennenga is a wonderful 43 y.o. 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  Patient was been managed for her chronic ITP by Dr Yvone Neu at [facility] in [place], [place] and recently moved to [place] and wants to establish care here.  As per review of outside records the patient was diagnosed with ITP since 2012 in [place] and has been treated with prednisone on off since then. She apparently had significant weight gain with prednisone and had chronically low platelets despite the prednisone. She was then started on Promacta 50 mg by mouth daily in June 2016 and had a very good response with platelet counts of more than 500k. Her Promacta was discontinued in July 2016. She had relapse of her ITP in September 2016 with platelet count was down to 49k any significant bleeding or bruising. He has been on variable doses of Promacta since then and reports that she is currently taking 25 mg on Monday Wednesday and Friday.  He has had previous iron deficiency due to heavy periods which was treated with oral iron replacement.  INTERVAL HISTORY  Ms Ratledge is here for evaluation and management of her chronic ITP and iron deficiency anemia. The patient's last visit with [place] was on 04/10/2019. The pt reports that she is doing well overall.  The pt reports that she had a great Thanksgiving. Pt began spotting in September which continued until November. On November 19th her cycle began to be heavy and she is still experiencing heavy bleeding now. Pt has already reached out to her OBGYN. She has continued to take her PO iron twice per day.  Pt has never been diagnosed with Diabetes and is not taking any mediation to control her blood glucose levels. She has not been able to get much activity due to being in the house because of the pandemic and the cold weather. Pt is scheduled to see Dr. Ayesha Rumpf, her PCP, on 12/29.   Lab results today (07/10/19) of CBC w/diff and CMP is as follows: all values are WNL except for Hgb 11.9, MCV at 78.2, MCH at 25.5, Glucose at 254, Calcium at 8.7. 07/10/2019 Ferritin at 81  On review of systems, pt reports heavy periods and denies abdominal pain, leg swelling and any other symptoms.   MEDICAL HISTORY:  #1 chronic ITP #2 Iron deficiency anemia due to heavy periods #3 hypothyroidism #4 degenerative arthritis #5 anxiety disorder #6 history of pulmonary embolism in 2004 status post IVC filter placement. 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 #7 migraine headaches  SURGICAL HISTORY: Past Surgical History:  Procedure Laterality Date  . C secton  2002  . COLONOSCOPY WITH PROPOFOL N/A 02/07/2018   Procedure: COLONOSCOPY WITH PROPOFOL;  Surgeon: Mauri Pole, MD;  Location: [facility];  Service: Endoscopy;  Laterality: N/A;  . IVC FILTER PLACEMENT ([facility] HX)  2004  . Left axillary lymph node excision biopsy  2008  . POLYPECTOMY  02/07/2018   Procedure: POLYPECTOMY;  Surgeon: Mauri Pole, MD;  Location: [facility];  Service: Endoscopy;;  Clips Placed    SOCIAL HISTORY: Social History   Socioeconomic History  . Marital status: Single    Spouse name: Not on file  . Number of children: Not on file  . Years of education: Not on file  . Highest education level: Not on file  Occupational History  . Not on file  Tobacco Use  . Smoking status: Never Smoker  . Smokeless tobacco: Never Used  Substance and Sexual Activity  . Alcohol use: Yes    Comment: occasional: not that often    . Drug use: No  . Sexual activity: Not on file  Other Topics Concern  . Not on file  Social History Narrative  . Not on file   Social Determinants of Health   Financial Resource Strain:   . Difficulty of Paying Living Expenses: Not on file  Food Insecurity:   . Worried About Charity fundraiser in the Last Year: Not on file  . Ran Out of Food in the Last Year: Not on file  Transportation Needs:   . Lack of Transportation (Medical): Not on file  . Lack of Transportation (Non-Medical): Not on file  Physical Activity:   . Days of Exercise per Week: Not on file  . Minutes of Exercise per Session: Not on file  Stress:   . Feeling of Stress : Not on file  Social Connections:   . Frequency of Communication with Friends and Family: Not on file  . Frequency of Social Gatherings with Friends and Family: Not on file  . Attends Religious Services: Not on file  . Active Member of Clubs or Organizations: Not on file  . Attends Archivist Meetings: Not on file  . Marital Status: Not on file  Intimate Partner Violence:   . Fear of Current or Ex-Partner: Not on file  . Emotionally Abused: Not on file  . Physically Abused: Not on file  . Sexually Abused: Not on file  Recently moved from [place], [place] to [place]. Nonsmoker minimal social alcohol use no drug use Currently unemployed Has a 72 year old daughter  FAMILY HISTORY: No known history of blood disorders Mother had a history of cervical cancer  maternal grandmother diabetes type 2 Maternal uncle diabetes type 2  ALLERGIES:  is allergic to dextromethorphan and doxylamine succinate (sleep).  MEDICATIONS:  Current Outpatient Medications  Medication Sig Dispense Refill  . Cholecalciferol (VITAMIN D3 PO) Take 1 capsule by mouth daily.    . Cyanocobalamin (B-12) 1000 MCG SUBL Place 1,000 mcg under the tongue daily. 30 each 3  . iron polysaccharides (NIFEREX) 150 MG capsule Take 1 capsule (150 mg total) by mouth 2  (two) times daily.    Marland Kitchen levothyroxine (SYNTHROID, LEVOTHROID) 100 MCG tablet Take 1 tablet (100 mcg total) by mouth daily before breakfast. 30 tablet 0  . Multiple Vitamin (MULTIVITAMIN) tablet Take 1 tablet by mouth daily.    Marland Kitchen PROMACTA 25 MG tablet TAKE 1 TABLET (25 MG TOTAL) BY MOUTH DAILY. TAKE ON AM EMPTY STOMACH 1 HR BEFORE OR 2 HR AFTER FOOD. SEPARATE FROM IRON SUPPLEMENT. 30 tablet 2   No current facility-administered medications for this visit.    REVIEW OF SYSTEMS:   A 10+ POINT REVIEW OF SYSTEMS WAS OBTAINED including neurology, dermatology, psychiatry, cardiac, respiratory, lymph, extremities, GI, GU, Musculoskeletal, constitutional, breasts, reproductive, HEENT.  All pertinent positives are noted in the HPI.  All others are negative.   PHYSICAL EXAMINATION: ECOG PERFORMANCE STATUS: 1 - Symptomatic but completely ambulatory  Vitals:   07/10/19 1124  BP: (!) 159/88  Pulse: 95  Resp: 18  Temp: 98 F (36.7 C)  SpO2: 100%   Filed Weights   07/10/19 1124  Weight: (!) 349 lb 3.2 oz (158.4 kg)   .Body mass index is 63.87 kg/m.   Exam was given in a chair   GENERAL:alert, in no acute distress and comfortable SKIN: no acute rashes, no significant lesions EYES: conjunctiva are pink and non-injected, sclera anicteric OROPHARYNX: MMM, no exudates, no oropharyngeal erythema or ulceration NECK: supple, no JVD LYMPH:  no palpable lymphadenopathy in the cervical, axillary or inguinal regions LUNGS: clear to auscultation b/l with normal respiratory effort HEART: regular rate & rhythm ABDOMEN:  normoactive bowel sounds , non tender, not distended. No palpable hepatosplenomegaly.  Extremity: no pedal edema PSYCH: alert & oriented x 3 with fluent speech NEURO: no focal motor/sensory deficits  LABORATORY DATA:  I have reviewed the data as listed  . CBC Latest Ref Rng & Units 07/10/2019 04/10/2019 01/26/2019  WBC 4.0 - 10.5 K/uL 6.7 7.1 8.9  Hemoglobin 12.0 - 15.0 g/dL 11.9(L)  13.0 12.2  Hematocrit 36.0 - 46.0 % 36.5 39.0 37.1  Platelets 150 - 400 K/uL 175 80(L) 63(L)    CMP Latest Ref Rng & Units 07/10/2019 04/10/2019 01/26/2019  Glucose 70 - 99 mg/dL 254(H) 162(H) 132(H)  BUN 6 - 20 mg/dL 8 11 9   Creatinine 0.44 - 1.00 mg/dL 0.78 0.72 0.76  Sodium 135 - 145 mmol/L 139 139 138  Potassium 3.5 - 5.1 mmol/L 3.9 3.9 3.8  Chloride 98 - 111 mmol/L 106 105 103  CO2 22 - 32 mmol/L 22 24 25   Calcium 8.9 - 10.3 mg/dL 8.7(L) 8.7(L) 8.6(L)  Total Protein 6.5 - 8.1 g/dL 7.0 7.1 7.2  Total Bilirubin 0.3 - 1.2 mg/dL 0.3 0.5 0.4  Alkaline Phos 38 - 126 U/L 99 92 93  AST 15 - 41 U/L 17 15 20   ALT 0 - 44 U/L 16 14 22    . Lab Results  Component Value Date   IRON 72 11/27/2018   TIBC 394 11/27/2018   IRONPCTSAT 18 (L) 11/27/2018   (Iron and TIBC)  Lab Results  Component Value Date   FERRITIN 81 07/10/2019    RADIOGRAPHIC STUDIES: I have personally reviewed the radiological images as listed and agreed with the findings in the report. No results found.  ASSESSMENT & PLAN:   43 y.o. African-American female with   1) Chronic ITP since 2012 now with acute relapse of her ITP with very labile platelet counts. No overt bleeding at this time. Patient has been on and off steroids and has been on Promacta since 2016. Was apparently on Promacta 25mg  po MWF. No issues with bleeding at this time. No petechiae B12 wnl. Ultrasound abdomen 06/02/2016- did show some mild splenomegaly which could be an additional factor in her thrombocytopenia. The spleen measures 12.9 x 13.9 x 6.7 cm. The calculated volume is 628 cc. Unclear etiology of splenomegaly. Liver appeared normal.  Patient's platelet counts have normalized after treatment with Rituxan weekly 4 doses with platelets improved temporarily but she needed additional treatment.  PLAN:  -Discussed pt labwork today, 07/10/19; all values are WNL except for Hgb 11.9, MCV at 78.2, MCH at 25.5, Glucose at 254, Calcium at  8.7. -Discussed 07/10/2019 Ferritin at 81 -The pt has no prohibitive toxicities from continuing 25mg  Promacta daily at this time  -If PLT counts continue to be stable/increase further, will consider lowering dose  of Promacta  -Goal for PLT >50k - currently at goal -Continue 150mg  Iron Polysaccharide BID -Advised pt to absolutely avoid NSAIDS, ASA and similar products -Recommended that the pt continue to eat well, drink at least 48-64 oz of water each day, and walk 20-30 minutes each day.  -Recommended pt work on healthy weight loss  -Recommend pt receive a Hgb a1c test with her PCP -Advised pt to f/u with OBGYN for menorrhagia -Advised pt to f/u with Dr. Ayesha Rumpf on 12/29  -Will see back in 3 months with labs   2) Iron deficiency Anemia - likely from heavy menstrual losses. -Tolerating the po iron without any significant issues. She previously was not taking PO iron for a period of time because she states that she forgot to pick up her medication. I previously encouraged her to be more compliant with her medication to improve her iron levels to avoid IV iron. She understands and is agreeable.   Lab Results  Component Value Date   FERRITIN 81 07/10/2019   Plan  -Continue oral iron replacement with iron polysaccharide 150 mg by mouth twice a day till ferritin close to 100 -Her periods/menorrhagia remain overall stable.   3)  Hypothyroidism - on levothyroxine As per primary care physician -continue Mx per PCP  4) . Patient Active Problem List   Diagnosis Date Noted  . Body mass index (BMI) of 50.0 to 59.9 in adult [facility])   . BRBPR (bright red blood per rectum)   . Polyp of descending colon   . Encntr for gyn exam (general) (routine) w/o abn findings 10/13/2016  . Exposure to STD 10/13/2016  . Chronic ITP (idiopathic thrombocytopenia) (H[facility]) 05/03/2016  . Thrombocytopenia ([facility]) 04/19/2016  . Iron deficiency anemia 04/19/2016  . Hypothyroidism 04/19/2016  . Degenerative arthritis  04/19/2016  . Generalized anxiety disorder 04/19/2016  -continue f/u with PCP  5) Obesity  -discussed lifestyle modifications including need for appropriate diet and exercise. Encouraged her to take at least 10,000 steps a day and have a balanced healthy diet.  6) H/o PE in 2004-- still has IVC filter in situ. Thought to be triggered by immobility and obesity. -not on anticoagulation at this time.  FOLLOW UP: RTC with Dr Irene Limbo with labs in 3 months   The total time spent in the appt was 20 minutes and more than 50% was on counseling and direct patient cares.  All of the patient's questions were answered with apparent satisfaction. The patient knows to call the clinic with any problems, questions or concerns.   Sullivan Lone MD [place] AAHIVMS [facility] Hematology/Oncology Physician [facility]  (Office):       [phone] (Work cell):  [phone] (Fax):           [phone]  I, Yevette Edwards, am acting as a scribe for Dr. Sullivan Lone.   .I have reviewed the above documentation for accuracy and completeness, and I agree with the above. Brunetta Genera MD

## 2019-07-10 ENCOUNTER — Inpatient Hospital Stay (HOSPITAL_BASED_OUTPATIENT_CLINIC_OR_DEPARTMENT_OTHER): Payer: Medicaid Other | Admitting: Hematology

## 2019-07-10 ENCOUNTER — Inpatient Hospital Stay: Payer: Medicaid Other | Attending: Hematology

## 2019-07-10 ENCOUNTER — Other Ambulatory Visit: Payer: Self-pay

## 2019-07-10 VITALS — BP 159/88 | HR 95 | Temp 98.0°F | Resp 18 | Ht 62.0 in | Wt 349.2 lb

## 2019-07-10 DIAGNOSIS — G43909 Migraine, unspecified, not intractable, without status migrainosus: Secondary | ICD-10-CM | POA: Insufficient documentation

## 2019-07-10 DIAGNOSIS — F411 Generalized anxiety disorder: Secondary | ICD-10-CM | POA: Diagnosis not present

## 2019-07-10 DIAGNOSIS — Z6841 Body Mass Index (BMI) 40.0 and over, adult: Secondary | ICD-10-CM | POA: Insufficient documentation

## 2019-07-10 DIAGNOSIS — Z833 Family history of diabetes mellitus: Secondary | ICD-10-CM | POA: Insufficient documentation

## 2019-07-10 DIAGNOSIS — E039 Hypothyroidism, unspecified: Secondary | ICD-10-CM | POA: Diagnosis not present

## 2019-07-10 DIAGNOSIS — Z79899 Other long term (current) drug therapy: Secondary | ICD-10-CM | POA: Diagnosis not present

## 2019-07-10 DIAGNOSIS — D693 Immune thrombocytopenic purpura: Secondary | ICD-10-CM

## 2019-07-10 DIAGNOSIS — D509 Iron deficiency anemia, unspecified: Secondary | ICD-10-CM | POA: Diagnosis not present

## 2019-07-10 DIAGNOSIS — Z86711 Personal history of pulmonary embolism: Secondary | ICD-10-CM | POA: Diagnosis not present

## 2019-07-10 DIAGNOSIS — E669 Obesity, unspecified: Secondary | ICD-10-CM | POA: Insufficient documentation

## 2019-07-10 DIAGNOSIS — M1990 Unspecified osteoarthritis, unspecified site: Secondary | ICD-10-CM | POA: Insufficient documentation

## 2019-07-10 DIAGNOSIS — Z8049 Family history of malignant neoplasm of other genital organs: Secondary | ICD-10-CM | POA: Diagnosis not present

## 2019-07-10 DIAGNOSIS — R161 Splenomegaly, not elsewhere classified: Secondary | ICD-10-CM | POA: Diagnosis not present

## 2019-07-10 DIAGNOSIS — N920 Excessive and frequent menstruation with regular cycle: Secondary | ICD-10-CM | POA: Insufficient documentation

## 2019-07-10 DIAGNOSIS — G35 Multiple sclerosis: Secondary | ICD-10-CM | POA: Insufficient documentation

## 2019-07-10 LAB — CBC WITH DIFFERENTIAL/PLATELET
Abs Immature Granulocytes: 0.05 10*3/uL (ref 0.00–0.07)
Basophils Absolute: 0 10*3/uL (ref 0.0–0.1)
Basophils Relative: 0 %
Eosinophils Absolute: 0.1 10*3/uL (ref 0.0–0.5)
Eosinophils Relative: 1 %
HCT: 36.5 % (ref 36.0–46.0)
Hemoglobin: 11.9 g/dL — ABNORMAL LOW (ref 12.0–15.0)
Immature Granulocytes: 1 %
Lymphocytes Relative: 15 %
Lymphs Abs: 1 10*3/uL (ref 0.7–4.0)
MCH: 25.5 pg — ABNORMAL LOW (ref 26.0–34.0)
MCHC: 32.6 g/dL (ref 30.0–36.0)
MCV: 78.2 fL — ABNORMAL LOW (ref 80.0–100.0)
Monocytes Absolute: 0.6 10*3/uL (ref 0.1–1.0)
Monocytes Relative: 9 %
Neutro Abs: 5 10*3/uL (ref 1.7–7.7)
Neutrophils Relative %: 74 %
Platelets: 175 10*3/uL (ref 150–400)
RBC: 4.67 MIL/uL (ref 3.87–5.11)
RDW: 13.4 % (ref 11.5–15.5)
WBC: 6.7 10*3/uL (ref 4.0–10.5)
nRBC: 0 % (ref 0.0–0.2)

## 2019-07-10 LAB — CMP (CANCER CENTER ONLY)
ALT: 16 U/L (ref 0–44)
AST: 17 U/L (ref 15–41)
Albumin: 3.5 g/dL (ref 3.5–5.0)
Alkaline Phosphatase: 99 U/L (ref 38–126)
Anion gap: 11 (ref 5–15)
BUN: 8 mg/dL (ref 6–20)
CO2: 22 mmol/L (ref 22–32)
Calcium: 8.7 mg/dL — ABNORMAL LOW (ref 8.9–10.3)
Chloride: 106 mmol/L (ref 98–111)
Creatinine: 0.78 mg/dL (ref 0.44–1.00)
GFR, Est AFR Am: >60 mL/min (ref >60)
GFR, Estimated: >60 mL/min (ref >60)
Glucose, Bld: 254 mg/dL — ABNORMAL HIGH (ref 70–99)
Potassium: 3.9 mmol/L (ref 3.5–5.1)
Sodium: 139 mmol/L (ref 135–145)
Total Bilirubin: 0.3 mg/dL (ref 0.3–1.2)
Total Protein: 7 g/dL (ref 6.5–8.1)

## 2019-07-10 LAB — FERRITIN: Ferritin: 81 ng/mL (ref 11–307)

## 2019-07-11 ENCOUNTER — Telehealth: Payer: Self-pay | Admitting: Hematology

## 2019-07-11 NOTE — Telephone Encounter (Signed)
Scheduled appt per 12/22 los.  Spoke with pt and they are aware of the appt date and time. 

## 2019-08-09 MED FILL — PROMACTA 25 MG TABLET: 25 | 30 days supply | Qty: 30 | Fill #2

## 2019-08-23 ENCOUNTER — Other Ambulatory Visit: Payer: Self-pay | Admitting: Family Medicine

## 2019-08-23 DIAGNOSIS — R102 Pelvic and perineal pain: Secondary | ICD-10-CM

## 2019-08-23 DIAGNOSIS — N920 Excessive and frequent menstruation with regular cycle: Secondary | ICD-10-CM

## 2019-08-29 ENCOUNTER — Ambulatory Visit
Admission: RE | Admit: 2019-08-29 | Discharge: 2019-08-29 | Disposition: A | Discharge status: Home / Self Care | Payer: Medicaid Other | Source: Ambulatory Visit | Attending: Family Medicine | Admitting: Family Medicine

## 2019-08-29 DIAGNOSIS — R102 Pelvic and perineal pain: Secondary | ICD-10-CM

## 2019-08-29 DIAGNOSIS — N920 Excessive and frequent menstruation with regular cycle: Secondary | ICD-10-CM

## 2019-09-04 ENCOUNTER — Other Ambulatory Visit: Payer: Self-pay | Admitting: Hematology

## 2019-09-04 DIAGNOSIS — D693 Immune thrombocytopenic purpura: Secondary | ICD-10-CM

## 2019-09-04 NOTE — Telephone Encounter (Signed)
Refill requested. Does she continue same dose?

## 2019-09-06 MED FILL — PROMACTA 25 MG TABLET: 25 | 30 days supply | Qty: 30 | Fill #0

## 2019-10-05 MED FILL — PROMACTA 25 MG TABLET: 25 | 30 days supply | Qty: 30 | Fill #1

## 2019-10-08 NOTE — Progress Notes (Signed)
HEMATOLOGY/ONCOLOGY CLINIC NOTE  Date of Service: 10/09/19  PCP: [address]:  F/u for continued management of ITP  HISTORY OF PRESENTING ILLNESS:   Kiara Barrera is a wonderful 44 y.o. 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  Patient was been managed for her chronic ITP by Dr Yvone Neu at [facility] in [place], [place] and recently moved to [place] and wants to establish care here.  As per review of outside records the patient was diagnosed with ITP since 2012 in [place] and has been treated with prednisone on off since then. She apparently had significant weight gain with prednisone and had chronically low platelets despite the prednisone. She was then started on Promacta 50 mg by mouth daily in June 2016 and had a very good response with platelet counts of more than 500k. Her Promacta was discontinued in July 2016. She had relapse of her ITP in September 2016 with platelet count was down to 49k any significant bleeding or bruising. He has been on variable doses of Promacta since then and reports that she is currently taking 25 mg on Monday Wednesday and Friday.  He has had previous iron deficiency due to heavy periods which was treated with oral iron replacement.  INTERVAL HISTORY   Kiara Barrera is here for evaluation and management of her chronic ITP and iron deficiency anemia. The patient's last visit with [place] was on 07/10/19. The pt reports that she is doing well overall.  The pt reports she has been great. Her PCP sent her to get an [place] for fibroids. She is taking her iron pills daily. Pt's period has been very heavy recently. She has had no trouble tolerating 25 mg of Promacta daily. Pt has not gotten her COVID19 vaccination. She has gotten a treadmill and has used it  once, but wants to get it fixed up.   Lab results today (10/09/19) of CBC w/diff and CMP is as follows: all values are WNL except for RBC at 5.43, MCV at 73.1, MCH at 23.8, Platelets at 113, Abs Immature Granulocytes at 0.09K, Glucose at 209 10/09/19 of Iron and TIBC: all values are WNL 10/09/19 of Ferritin at 83  On review of systems, pt reports trouble falling asleep and denies abdominal pain, irregular bowl movements and any other symptoms.    MEDICAL HISTORY:  #1 chronic ITP #2 Iron deficiency anemia due to heavy periods #3 hypothyroidism #4 degenerative arthritis #5 anxiety disorder #6 history of pulmonary embolism in 2004 status post IVC filter placement. 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 #7 migraine headaches  SURGICAL HISTORY: Past Surgical History:  Procedure Laterality Date  . C secton  2002  . COLONOSCOPY WITH PROPOFOL N/A 02/07/2018   Procedure: COLONOSCOPY WITH PROPOFOL;  Surgeon: Mauri Pole, MD;  Location: [facility];  Service: Endoscopy;  Laterality: N/A;  . IVC FILTER PLACEMENT ([facility] HX)  2004  . Left axillary lymph node excision biopsy  2008  . POLYPECTOMY  02/07/2018   Procedure: POLYPECTOMY;  Surgeon: Mauri Pole, MD;  Location: [facility];  Service: Endoscopy;;  Clips Placed    SOCIAL HISTORY: Social History   Socioeconomic History  . Marital status: Single  Spouse name: Not on file  . Number of children: Not on file  . Years of education: Not on file  . Highest education level: Not on file  Occupational History  . Not on file  Tobacco Use  . Smoking status: Never Smoker  . Smokeless tobacco: Never Used  Substance and Sexual Activity  . Alcohol use: Yes    Comment: occasional: not that often  . Drug use: No  . Sexual activity: Not on file  Other Topics Concern  . Not on file  Social History Narrative  . Not on file    Social Determinants of Health   Financial Resource Strain:   . Difficulty of Paying Living Expenses:   Food Insecurity:   . Worried About Charity fundraiser in the Last Year:   . Arboriculturist in the Last Year:   Transportation Needs:   . Film/video editor (Medical):   Marland Kitchen Lack of Transportation (Non-Medical):   Physical Activity:   . Days of Exercise per Week:   . Minutes of Exercise per Session:   Stress:   . Feeling of Stress :   Social Connections:   . Frequency of Communication with Friends and Family:   . Frequency of Social Gatherings with Friends and Family:   . Attends Religious Services:   . Active Member of Clubs or Organizations:   . Attends Archivist Meetings:   Marland Kitchen Marital Status:   Intimate Partner Violence:   . Fear of Current or Ex-Partner:   . Emotionally Abused:   Marland Kitchen Physically Abused:   . Sexually Abused:   Recently moved from [place], [place] to [place]. Nonsmoker minimal social alcohol use no drug use Currently unemployed Has a 51 year old daughter  FAMILY HISTORY: No known history of blood disorders Mother had a history of cervical cancer  maternal grandmother diabetes type 2 Maternal uncle diabetes type 2  ALLERGIES:  is allergic to dextromethorphan and doxylamine succinate (sleep).  MEDICATIONS:  Current Outpatient Medications  Medication Sig Dispense Refill  . Cholecalciferol (VITAMIN D3 PO) Take 1 capsule by mouth daily.    . Cyanocobalamin (B-12) 1000 MCG SUBL Place 1,000 mcg under the tongue daily. 30 each 3  . iron polysaccharides (NIFEREX) 150 MG capsule Take 1 capsule (150 mg total) by mouth 2 (two) times daily.    Marland Kitchen levothyroxine (SYNTHROID, LEVOTHROID) 100 MCG tablet Take 1 tablet (100 mcg total) by mouth daily before breakfast. 30 tablet 0  . Multiple Vitamin (MULTIVITAMIN) tablet Take 1 tablet by mouth daily.    Marland Kitchen PROMACTA 25 MG tablet TAKE 1 TABLET (25 MG TOTAL) BY MOUTH DAILY. TAKE ON AM EMPTY STOMACH 1 HR  BEFORE OR 2 HR AFTER FOOD. SEPARATE FROM IRON SUPPLEMENT. 30 tablet 2   No current facility-administered medications for this visit.    REVIEW OF SYSTEMS:   A 10+ POINT REVIEW OF SYSTEMS WAS OBTAINED including neurology, dermatology, psychiatry, cardiac, respiratory, lymph, extremities, GI, GU, Musculoskeletal, constitutional, breasts, reproductive, HEENT.  All pertinent positives are noted in the HPI.  All others are negative.   PHYSICAL EXAMINATION: ECOG PERFORMANCE STATUS: 1 - Symptomatic but completely ambulatory  Vitals:   10/09/19 0902  BP: (!) 162/79  Pulse: 93  Resp: 18  Temp: 98.7 F (37.1 C)  SpO2: 98%   Filed Weights   10/09/19 0902  Weight: (!) 348 lb 4.8 oz (158 kg)   .Body mass index is 63.7 kg/m.   Exam was given in a chair  GENERAL:alert, in no acute distress and comfortable SKIN: no acute rashes, no significant lesions EYES: conjunctiva are pink and non-injected, sclera anicteric OROPHARYNX: MMM, no exudates, no oropharyngeal erythema or ulceration NECK: supple, no JVD LYMPH:  no palpable lymphadenopathy in the cervical, axillary or inguinal regions LUNGS: clear to auscultation b/l with normal respiratory effort HEART: regular rate & rhythm ABDOMEN:  normoactive bowel sounds , non tender, not distended. Extremity: no pedal edema PSYCH: alert & oriented x 3 with fluent speech NEURO: no focal motor/sensory deficits  LABORATORY DATA:  I have reviewed the data as listed  . CBC Latest Ref Rng & Units 10/09/2019 07/10/2019 04/10/2019  WBC 4.0 - 10.5 K/uL 7.4 6.7 7.1  Hemoglobin 12.0 - 15.0 g/dL 12.9 11.9(L) 13.0  Hematocrit 36.0 - 46.0 % 39.7 36.5 39.0  Platelets 150 - 400 K/uL 113(L) 175 80(L)    CMP Latest Ref Rng & Units 10/09/2019 07/10/2019 04/10/2019  Glucose 70 - 99 mg/dL 209(H) 254(H) 162(H)  BUN 6 - 20 mg/dL 9 8 11   Creatinine 0.44 - 1.00 mg/dL 0.74 0.78 0.72  Sodium 135 - 145 mmol/L 140 139 139  Potassium 3.5 - 5.1 mmol/L 4.0 3.9 3.9   Chloride 98 - 111 mmol/L 104 106 105  CO2 22 - 32 mmol/L 24 22 24   Calcium 8.9 - 10.3 mg/dL 9.1 8.7(L) 8.7(L)  Total Protein 6.5 - 8.1 g/dL 7.2 7.0 7.1  Total Bilirubin 0.3 - 1.2 mg/dL 0.4 0.3 0.5  Alkaline Phos 38 - 126 U/L 106 99 92  AST 15 - 41 U/L 22 17 15   ALT 0 - 44 U/L 23 16 14    . Lab Results  Component Value Date   IRON 94 10/09/2019   TIBC 408 10/09/2019   IRONPCTSAT 23 10/09/2019   (Iron and TIBC)  Lab Results  Component Value Date   FERRITIN 83 10/09/2019    RADIOGRAPHIC STUDIES: I have personally reviewed the radiological images as listed and agreed with the findings in the report. No results found.  ASSESSMENT & PLAN:   44 y.o. African-American female with   1) Chronic ITP since 2012 now with acute relapse of her ITP with very labile platelet counts. No overt bleeding at this time. Patient has been on and off steroids and has been on Promacta since 2016. Was apparently on Promacta 25mg  po MWF. No issues with bleeding at this time. No petechiae B12 wnl. Ultrasound abdomen 06/02/2016- did show some mild splenomegaly which could be an additional factor in her thrombocytopenia. The spleen measures 12.9 x 13.9 x 6.7 cm. The calculated volume is 628 cc. Unclear etiology of splenomegaly. Liver appeared normal.  Patient's platelet counts have normalized after treatment with Rituxan weekly 4 doses with platelets improved temporarily but she needed additional treatment.  PLAN:  -Discussed pt labwork today, 10/09/19; of CBC w/diff and CMP is as follows: all values are WNL except for RBC at 5.43, MCV at 73.1, MCH at 23.8, Platelets at 113, Abs Immature Granulocytes at 0.09K, Glucose at 209 -Discussed 10/09/19 of Iron and TIBC: all values are WNL -Discussed 10/09/19 of Ferritin at 83 -Advised on watching TV before going to bed- causing trouble sleeping  -Recommends glucose being high- getting tested for diabetes with PCP -Recommends seeing OB/GYN for  menorrhagia -Recommends getting COVID19 vaccine -advised on COVID19 vaccination  -Continue Iron supplement 2 times daily  -The pt has no prohibitive toxicities from continuing 25mg  Promacta daily at this time  -Goal for PLT >50k  -Continue 150mg  Iron  Polysaccharide BID -Advised pt to absolutely avoid NSAIDS, ASA and similar products -Recommended that the pt continue to eat well, drink at least 48-64 oz of water each day, and walk 20-30 minutes each day.  -Recommended pt work on healthy weight loss  -Recommend pt receive a Hgb a1c test with her PCP -F/u with PCP for heavy periods   2) Iron deficiency Anemia - likely from heavy menstrual losses. -Tolerating the po iron without any significant issues. She previously was not taking PO iron for a period of time because she states that she forgot to pick up her medication. I previously encouraged her to be more compliant with her medication to improve her iron levels to avoid IV iron. She understands and is agreeable.   Lab Results  Component Value Date   FERRITIN 83 10/09/2019   Plan  -Continue oral iron replacement with iron polysaccharide 150 mg by mouth twice a day till ferritin close to 100 -Her periods/menorrhagia remain overall stable.   3)  Hypothyroidism - on levothyroxine As per primary care physician -continue Mx per PCP  4) . Patient Active Problem List   Diagnosis Date Noted  . Body mass index (BMI) of 50.0 to 59.9 in adult [facility])   . BRBPR (bright red blood per rectum)   . Polyp of descending colon   . Encntr for gyn exam (general) (routine) w/o abn findings 10/13/2016  . Exposure to STD 10/13/2016  . Chronic ITP (idiopathic thrombocytopenia) (H[facility]) 05/03/2016  . Thrombocytopenia ([facility]) 04/19/2016  . Iron deficiency anemia 04/19/2016  . Hypothyroidism 04/19/2016  . Degenerative arthritis 04/19/2016  . Generalized anxiety disorder 04/19/2016  -continue f/u with PCP  5) Obesity  -discussed lifestyle modifications  including need for appropriate diet and exercise. Encouraged her to take at least 10,000 steps a day and have a balanced healthy diet.  6) H/o PE in 2004-- still has IVC filter in situ. Thought to be triggered by immobility and obesity. -not on anticoagulation at this time.  FOLLOW UP: RTC with Dr Irene Limbo with labs in 3 months   The total time spent in the appt was 20 minutes and more than 50% was on counseling and direct patient cares.  All of the patient's questions were answered with apparent satisfaction. The patient knows to call the clinic with any problems, questions or concerns.   Sullivan Lone MD [place] AAHIVMS [facility] Hematology/Oncology Physician [facility]  (Office):       [phone] (Work cell):  [phone] (Fax):           [phone]  I, Dawayne Cirri am acting as a scribe for Dr. Sullivan Lone.   .I have reviewed the above documentation for accuracy and completeness, and I agree with the above. Brunetta Genera MD

## 2019-10-09 ENCOUNTER — Inpatient Hospital Stay: Payer: Medicaid Other | Attending: Hematology | Admitting: Hematology

## 2019-10-09 ENCOUNTER — Other Ambulatory Visit: Payer: Self-pay

## 2019-10-09 ENCOUNTER — Inpatient Hospital Stay: Payer: Medicaid Other

## 2019-10-09 VITALS — BP 162/79 | HR 93 | Temp 98.7°F | Resp 18 | Ht 62.0 in | Wt 348.3 lb

## 2019-10-09 DIAGNOSIS — Z560 Unemployment, unspecified: Secondary | ICD-10-CM | POA: Diagnosis not present

## 2019-10-09 DIAGNOSIS — Z79899 Other long term (current) drug therapy: Secondary | ICD-10-CM | POA: Diagnosis not present

## 2019-10-09 DIAGNOSIS — E039 Hypothyroidism, unspecified: Secondary | ICD-10-CM | POA: Diagnosis not present

## 2019-10-09 DIAGNOSIS — D693 Immune thrombocytopenic purpura: Secondary | ICD-10-CM

## 2019-10-09 DIAGNOSIS — Z833 Family history of diabetes mellitus: Secondary | ICD-10-CM | POA: Diagnosis not present

## 2019-10-09 DIAGNOSIS — D509 Iron deficiency anemia, unspecified: Secondary | ICD-10-CM | POA: Diagnosis not present

## 2019-10-09 DIAGNOSIS — F411 Generalized anxiety disorder: Secondary | ICD-10-CM | POA: Insufficient documentation

## 2019-10-09 DIAGNOSIS — D696 Thrombocytopenia, unspecified: Secondary | ICD-10-CM | POA: Diagnosis not present

## 2019-10-09 DIAGNOSIS — M1990 Unspecified osteoarthritis, unspecified site: Secondary | ICD-10-CM | POA: Insufficient documentation

## 2019-10-09 DIAGNOSIS — Z8049 Family history of malignant neoplasm of other genital organs: Secondary | ICD-10-CM | POA: Insufficient documentation

## 2019-10-09 DIAGNOSIS — E669 Obesity, unspecified: Secondary | ICD-10-CM | POA: Insufficient documentation

## 2019-10-09 DIAGNOSIS — D500 Iron deficiency anemia secondary to blood loss (chronic): Secondary | ICD-10-CM | POA: Insufficient documentation

## 2019-10-09 DIAGNOSIS — Z6841 Body Mass Index (BMI) 40.0 and over, adult: Secondary | ICD-10-CM | POA: Insufficient documentation

## 2019-10-09 DIAGNOSIS — G43909 Migraine, unspecified, not intractable, without status migrainosus: Secondary | ICD-10-CM | POA: Diagnosis not present

## 2019-10-09 DIAGNOSIS — G35 Multiple sclerosis: Secondary | ICD-10-CM | POA: Insufficient documentation

## 2019-10-09 DIAGNOSIS — R161 Splenomegaly, not elsewhere classified: Secondary | ICD-10-CM | POA: Diagnosis not present

## 2019-10-09 DIAGNOSIS — Z86711 Personal history of pulmonary embolism: Secondary | ICD-10-CM | POA: Insufficient documentation

## 2019-10-09 DIAGNOSIS — N920 Excessive and frequent menstruation with regular cycle: Secondary | ICD-10-CM | POA: Diagnosis not present

## 2019-10-09 LAB — IRON AND TIBC
Iron: 94 ug/dL (ref 41–142)
Saturation Ratios: 23 % (ref 21–57)
TIBC: 408 ug/dL (ref 236–444)
UIBC: 313 ug/dL (ref 120–384)

## 2019-10-09 LAB — CBC WITH DIFFERENTIAL/PLATELET
Abs Immature Granulocytes: 0.09 10*3/uL — ABNORMAL HIGH (ref 0.00–0.07)
Basophils Absolute: 0 10*3/uL (ref 0.0–0.1)
Basophils Relative: 0 %
Eosinophils Absolute: 0.2 10*3/uL (ref 0.0–0.5)
Eosinophils Relative: 2 %
HCT: 39.7 % (ref 36.0–46.0)
Hemoglobin: 12.9 g/dL (ref 12.0–15.0)
Immature Granulocytes: 1 %
Lymphocytes Relative: 15 %
Lymphs Abs: 1.1 10*3/uL (ref 0.7–4.0)
MCH: 23.8 pg — ABNORMAL LOW (ref 26.0–34.0)
MCHC: 32.5 g/dL (ref 30.0–36.0)
MCV: 73.1 fL — ABNORMAL LOW (ref 80.0–100.0)
Monocytes Absolute: 0.7 10*3/uL (ref 0.1–1.0)
Monocytes Relative: 9 %
Neutro Abs: 5.3 10*3/uL (ref 1.7–7.7)
Neutrophils Relative %: 73 %
Platelets: 113 10*3/uL — ABNORMAL LOW (ref 150–400)
RBC: 5.43 MIL/uL — ABNORMAL HIGH (ref 3.87–5.11)
RDW: 15.3 % (ref 11.5–15.5)
WBC: 7.4 10*3/uL (ref 4.0–10.5)
nRBC: 0 % (ref 0.0–0.2)

## 2019-10-09 LAB — CMP (CANCER CENTER ONLY)
ALT: 23 U/L (ref 0–44)
AST: 22 U/L (ref 15–41)
Albumin: 3.6 g/dL (ref 3.5–5.0)
Alkaline Phosphatase: 106 U/L (ref 38–126)
Anion gap: 12 (ref 5–15)
BUN: 9 mg/dL (ref 6–20)
CO2: 24 mmol/L (ref 22–32)
Calcium: 9.1 mg/dL (ref 8.9–10.3)
Chloride: 104 mmol/L (ref 98–111)
Creatinine: 0.74 mg/dL (ref 0.44–1.00)
GFR, Est AFR Am: >60 mL/min (ref >60)
GFR, Estimated: >60 mL/min (ref >60)
Glucose, Bld: 209 mg/dL — ABNORMAL HIGH (ref 70–99)
Potassium: 4 mmol/L (ref 3.5–5.1)
Sodium: 140 mmol/L (ref 135–145)
Total Bilirubin: 0.4 mg/dL (ref 0.3–1.2)
Total Protein: 7.2 g/dL (ref 6.5–8.1)

## 2019-10-09 LAB — FERRITIN: Ferritin: 83 ng/mL (ref 11–307)

## 2019-10-11 ENCOUNTER — Telehealth: Payer: Self-pay | Admitting: Hematology

## 2019-10-11 NOTE — Telephone Encounter (Signed)
Scheduled per 03/23 los, patient has been called and voicemail was left. 

## 2019-10-12 ENCOUNTER — Telehealth: Payer: Self-pay | Admitting: Hematology

## 2019-10-12 NOTE — Telephone Encounter (Signed)
Rescheduled appts per 3/25 sch msg. Pt confirmed new appt time.

## 2019-11-02 MED FILL — PROMACTA 25 MG TABLET: 25 | 30 days supply | Qty: 30 | Fill #2

## 2019-11-26 ENCOUNTER — Other Ambulatory Visit: Payer: Self-pay | Admitting: Hematology

## 2019-11-26 DIAGNOSIS — D693 Immune thrombocytopenic purpura: Secondary | ICD-10-CM

## 2019-12-05 MED FILL — PROMACTA 25 MG TABLET: 25 | 30 days supply | Qty: 30 | Fill #0

## 2020-01-07 MED FILL — PROMACTA 25 MG TABLET: 25 | 30 days supply | Qty: 30 | Fill #1

## 2020-01-09 NOTE — Progress Notes (Signed)
HEMATOLOGY/ONCOLOGY CLINIC NOTE  Date of Service: 01/10/20  PCP: [place]:  F/u for continued management of ITP  HISTORY OF PRESENTING ILLNESS:   Kiara Barrera is a wonderful 44 y.o. 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  Patient was been managed for her chronic ITP by Dr Yvone Neu at [facility] in [place], [place] and recently moved to [place] and wants to establish care here.  As per review of outside records the patient was diagnosed with ITP since 2012 in [place] and has been treated with prednisone on off since then. She apparently had significant weight gain with prednisone and had chronically low platelets despite the prednisone. She was then started on Promacta 50 mg by mouth daily in June 2016 and had a very good response with platelet counts of more than 500k. Her Promacta was discontinued in July 2016. She had relapse of her ITP in September 2016 with platelet count was down to 49k any significant bleeding or bruising. He has been on variable doses of Promacta since then and reports that she is currently taking 25 mg on Monday Wednesday and Friday.  He has had previous iron deficiency due to heavy periods which was treated with oral iron replacement.  INTERVAL HISTORY   Kiara Barrera is here for evaluation and management of her chronic ITP and iron deficiency anemia. The patient's last visit with [place] was on 10/09/19. The pt reports that she is doing well overall.  The pt reports she is good. She has gotten her COVID19 vaccines. Pt has no new concerns over the last several months. She has had no problem taking promacta. Pt has been staying active. She has cravings for sodas before her cycle is about to start. Pt is taking a multi-vitamin, oral iron and biotin.  Her menstrual cycles have been a full 7 days, the first 3 days are heavy. The fibroids are still bothering her.   Lab results today (01/10/20) of CBC w/diff and CMP is as follows: all values are WNL except for MCV at 75.4, MCH at 24.3, Glucose at 164 01/10/20 of Ferritin at 64: WNL 01/10/20 of LDH at 178: WNL  On review of systems, pt denies bleeding, bruising, abdominal pain, irregular bowl movements and any other symptoms.   MEDICAL HISTORY:  #1 chronic ITP #2 Iron deficiency anemia due to heavy periods #3 hypothyroidism #4 degenerative arthritis #5 anxiety disorder #6 history of pulmonary embolism in 2004 status post IVC filter placement. 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 #7 migraine headaches  SURGICAL HISTORY: Past Surgical History:  Procedure Laterality Date  . C secton  2002  . COLONOSCOPY WITH PROPOFOL N/A 02/07/2018   Procedure: COLONOSCOPY WITH PROPOFOL;  Surgeon: Mauri Pole, MD;  Location: [facility];  Service: Endoscopy;  Laterality: N/A;  . IVC FILTER PLACEMENT ([place] [facility])  2004  . Left axillary lymph node excision biopsy  2008  . POLYPECTOMY  02/07/2018   Procedure: POLYPECTOMY;  Surgeon: Mauri Pole, MD;  Location: [facility];  Service: Endoscopy;;  Clips Placed    SOCIAL HISTORY: Social History   Socioeconomic History  . Marital status: Single    Spouse name: Not on file  .  Number of children: Not on file  . Years of education: Not on file  . Highest education level: Not on file  Occupational History  . Not on file  Tobacco Use  . Smoking status: Never Smoker  . Smokeless tobacco: Never Used  Vaping Use  . Vaping Use: Never used  Substance and Sexual Activity  . Alcohol use: Yes    Comment: occasional: not that often  . Drug use: No  . Sexual activity: Not on file  Other Topics Concern  . Not on file  Social History Narrative   . Not on file   Social Determinants of Health   Financial Resource Strain:   . Difficulty of Paying Living Expenses:   Food Insecurity:   . Worried About Charity fundraiser in the Last Year:   . Arboriculturist in the Last Year:   Transportation Needs:   . Film/video editor (Medical):   Marland Kitchen Lack of Transportation (Non-Medical):   Physical Activity:   . Days of Exercise per Week:   . Minutes of Exercise per Session:   Stress:   . Feeling of Stress :   Social Connections:   . Frequency of Communication with Friends and Family:   . Frequency of Social Gatherings with Friends and Family:   . Attends Religious Services:   . Active Member of Clubs or Organizations:   . Attends Archivist Meetings:   Marland Kitchen Marital Status:   Intimate Partner Violence:   . Fear of Current or Ex-Partner:   . Emotionally Abused:   Marland Kitchen Physically Abused:   . Sexually Abused:   Recently moved from [place], [place] to [place]. Nonsmoker minimal social alcohol use no drug use Currently unemployed Has a 79 year old daughter  FAMILY HISTORY: No known history of blood disorders Mother had a history of cervical cancer  maternal grandmother diabetes type 2 Maternal uncle diabetes type 2  ALLERGIES:  is allergic to dextromethorphan and doxylamine succinate (sleep).  MEDICATIONS:  Current Outpatient Medications  Medication Sig Dispense Refill  . Cholecalciferol (VITAMIN D3 PO) Take 1 capsule by mouth daily.    . Cyanocobalamin (B-12) 1000 MCG SUBL Place 1,000 mcg under the tongue daily. 30 each 3  . ergocalciferol (VITAMIN D2) 1.25 MG (50000 UT) capsule Vitamin D2 1,250 mcg (50,000 unit) capsule  TK ONE C PO  ONCE A WEEK    . iron polysaccharides (NIFEREX) 150 MG capsule Take 1 capsule (150 mg total) by mouth 2 (two) times daily.    Marland Kitchen levothyroxine (SYNTHROID, LEVOTHROID) 100 MCG tablet Take 1 tablet (100 mcg total) by mouth daily before breakfast. 30 tablet 0  . Multiple Vitamin  (MULTIVITAMIN) tablet Take 1 tablet by mouth daily.    Marland Kitchen PROMACTA 25 MG tablet TAKE 1 TABLET (25 MG TOTAL) BY MOUTH DAILY. TAKE ON AM EMPTY STOMACH 1 HR BEFORE OR 2 HR AFTER FOOD. SEPARATE FROM IRON SUPPLEMENT. 30 tablet 2   No current facility-administered medications for this visit.    REVIEW OF SYSTEMS:   A 10+ POINT REVIEW OF SYSTEMS WAS OBTAINED including neurology, dermatology, psychiatry, cardiac, respiratory, lymph, extremities, GI, GU, Musculoskeletal, constitutional, breasts, reproductive, HEENT.  All pertinent positives are noted in the HPI.  All others are negative.   PHYSICAL EXAMINATION: ECOG PERFORMANCE STATUS: 1 - Symptomatic but completely ambulatory  Vitals:   01/10/20 0855  BP: (!) 142/89  Pulse: 91  Resp: 18  Temp: 97.7 F (36.5 C)  SpO2: 99%   Filed Weights  01/10/20 0855  Weight: (!) 341 lb 11.2 oz (155 kg)   .Body mass index is 62.5 kg/m.   Exam was given in a chair   GENERAL:alert, in no acute distress and comfortable SKIN: no acute rashes, no significant lesions EYES: conjunctiva are pink and non-injected, sclera anicteric OROPHARYNX: MMM, no exudates, no oropharyngeal erythema or ulceration NECK: supple, no JVD LYMPH:  no palpable lymphadenopathy in the cervical, axillary or inguinal regions LUNGS: clear to auscultation b/l with normal respiratory effort HEART: regular rate & rhythm ABDOMEN:  normoactive bowel sounds , non tender, not distended. Extremity: no pedal edema PSYCH: alert & oriented x 3 with fluent speech NEURO: no focal motor/sensory deficits  LABORATORY DATA:  I have reviewed the data as listed  . CBC Latest Ref Rng & Units 01/10/2020 10/09/2019 07/10/2019  WBC 4.0 - 10.5 K/uL 7.3 7.4 6.7  Hemoglobin 12.0 - 15.0 g/dL 12.3 12.9 11.9(L)  Hematocrit 36 - 46 % 38.4 39.7 36.5  Platelets 150 - 400 K/uL 178 113(L) 175    CMP Latest Ref Rng & Units 01/10/2020 10/09/2019 07/10/2019  Glucose 70 - 99 mg/dL 164(H) 209(H) 254(H)  BUN 6 -  20 mg/dL 8 9 8   Creatinine 0.44 - 1.00 mg/dL 0.75 0.74 0.78  Sodium 135 - 145 mmol/L 139 140 139  Potassium 3.5 - 5.1 mmol/L 3.9 4.0 3.9  Chloride 98 - 111 mmol/L 104 104 106  CO2 22 - 32 mmol/L 23 24 22   Calcium 8.9 - 10.3 mg/dL 9.0 9.1 8.7(L)  Total Protein 6.5 - 8.1 g/dL 7.2 7.2 7.0  Total Bilirubin 0.3 - 1.2 mg/dL 0.3 0.4 0.3  Alkaline Phos 38 - 126 U/L 104 106 99  AST 15 - 41 U/L 16 22 17   ALT 0 - 44 U/L 21 23 16    . Lab Results  Component Value Date   IRON 94 10/09/2019   TIBC 408 10/09/2019   IRONPCTSAT 23 10/09/2019   (Iron and TIBC)  Lab Results  Component Value Date   FERRITIN 64 01/10/2020    RADIOGRAPHIC STUDIES: I have personally reviewed the radiological images as listed and agreed with the findings in the report. No results found.  ASSESSMENT & PLAN:   44 y.o. African-American female with   1) Chronic ITP since 2012 now with acute relapse of her ITP with very labile platelet counts. No overt bleeding at this time. Patient has been on and off steroids and has been on Promacta since 2016. Was apparently on Promacta 25mg  po MWF. No issues with bleeding at this time. No petechiae B12 wnl. Ultrasound abdomen 06/02/2016- did show some mild splenomegaly which could be an additional factor in her thrombocytopenia. The spleen measures 12.9 x 13.9 x 6.7 cm. The calculated volume is 628 cc. Unclear etiology of splenomegaly. Liver appeared normal.  Patient's platelet counts have normalized after treatment with Rituxan weekly 4 doses with platelets improved temporarily but she needed additional treatment.  PLAN:  -Discussed pt labwork today, 01/10/20; of CBC w/diff and CMP is as follows: all values are WNL except for MCV at 75.4, MCH at 24.3, Glucose at 164 -Discussed 01/10/20 of Ferritin at 64: WNL -Discussed 01/10/20 of LDH at 178: WNL -Advised on reducing TV/Screen time  -Advised on fibroids -Advised on tapering Promacta  -The pt has no prohibitive toxicities  from continuing 25mg  Promacta daily at this time  -Goal for PLT >50k  -Advised pt to absolutely avoid NSAIDS, ASA and similar products -Advised healthy eating and staying active  -  Advised on sugar cravings- eating simple foods -Advised on intermittent fasting 1 day a week (about 14-16 hours of fasting)  -Recommend Continue taking multi-vitamin and 150mg  Iron Polysaccharide BID -Advised not taking 150mg  Iron Polysaccharide BID with thyroid pill or Promacta -Continue Iron supplement 2 times daily  -Advised weight loss might reduce need for Promacta  -Recommended that the pt continue to eat well, drink at least 48-64 oz of water each day, and walk 20-30 minutes each day.  -Recommended pt work on healthy weight loss  -Recommend pt receive a Hgb a1c test with her PCP -Will see back in 3 months   2) Iron deficiency Anemia - likely from heavy menstrual losses. -Tolerating the po iron without any significant issues. She previously was not taking PO iron for a period of time because she states that she forgot to pick up her medication. I previously encouraged her to be more compliant with her medication to improve her iron levels to avoid IV iron. She understands and is agreeable.   Lab Results  Component Value Date   FERRITIN 64 01/10/2020   Plan  -Continue oral iron replacement with iron polysaccharide 150 mg by mouth twice a day till ferritin close to 100 -Her periods/menorrhagia remain overall stable.   3)  Hypothyroidism - on levothyroxine As per primary care physician -continue Mx per PCP  4) . Patient Active Problem List   Diagnosis Date Noted  . Body mass index (BMI) of 50.0 to 59.9 in adult [facility])   . BRBPR (bright red blood per rectum)   . Polyp of descending colon   . Encntr for gyn exam (general) (routine) w/o abn findings 10/13/2016  . Exposure to STD 10/13/2016  . Chronic ITP (idiopathic thrombocytopenia) (H[facility]) 05/03/2016  . Thrombocytopenia ([place]) 04/19/2016  . Iron  deficiency anemia 04/19/2016  . Hypothyroidism 04/19/2016  . Degenerative arthritis 04/19/2016  . Generalized anxiety disorder 04/19/2016  -continue f/u with PCP  5) Obesity  -discussed lifestyle modifications including need for appropriate diet and exercise. Encouraged her to take at least 10,000 steps a day and have a balanced healthy diet.  6) H/o PE in 2004-- still has IVC filter in situ. Thought to be triggered by immobility and obesity. -not on anticoagulation at this time.  FOLLOW UP: RTC with Dr Irene Limbo with labs in 3 months   The total time spent in the appt was 25 minutes and more than 50% was on counseling and direct patient cares.  All of the patient's questions were answered with apparent satisfaction. The patient knows to call the clinic with any problems, questions or concerns.  Sullivan Lone MD [place] AAHIVMS [facility] Hematology/Oncology Physician [facility]  (Office):       [phone] (Work cell):  [phone] (Fax):           [phone]  I, Dawayne Cirri am acting as a scribe for Dr. Sullivan Lone.   .I have reviewed the above documentation for accuracy and completeness, and I agree with the above. Brunetta Genera MD

## 2020-01-10 ENCOUNTER — Inpatient Hospital Stay (HOSPITAL_BASED_OUTPATIENT_CLINIC_OR_DEPARTMENT_OTHER): Payer: Medicaid Other | Admitting: Hematology

## 2020-01-10 ENCOUNTER — Ambulatory Visit: Payer: Medicaid Other | Admitting: Hematology

## 2020-01-10 ENCOUNTER — Other Ambulatory Visit: Payer: Self-pay

## 2020-01-10 ENCOUNTER — Inpatient Hospital Stay: Payer: Medicaid Other | Attending: Hematology

## 2020-01-10 ENCOUNTER — Other Ambulatory Visit: Payer: Medicaid Other

## 2020-01-10 VITALS — BP 142/89 | HR 91 | Temp 97.7°F | Resp 18 | Ht 62.0 in | Wt 341.7 lb

## 2020-01-10 DIAGNOSIS — D509 Iron deficiency anemia, unspecified: Secondary | ICD-10-CM

## 2020-01-10 DIAGNOSIS — D693 Immune thrombocytopenic purpura: Secondary | ICD-10-CM | POA: Insufficient documentation

## 2020-01-10 LAB — CBC WITH DIFFERENTIAL/PLATELET
Abs Immature Granulocytes: 0.05 10*3/uL (ref 0.00–0.07)
Basophils Absolute: 0 10*3/uL (ref 0.0–0.1)
Basophils Relative: 0 %
Eosinophils Absolute: 0.1 10*3/uL (ref 0.0–0.5)
Eosinophils Relative: 2 %
HCT: 38.4 % (ref 36.0–46.0)
Hemoglobin: 12.3 g/dL (ref 12.0–15.0)
Immature Granulocytes: 1 %
Lymphocytes Relative: 14 %
Lymphs Abs: 1 10*3/uL (ref 0.7–4.0)
MCH: 24.3 pg — ABNORMAL LOW (ref 26.0–34.0)
MCHC: 32 g/dL (ref 30.0–36.0)
MCV: 75.9 fL — ABNORMAL LOW (ref 80.0–100.0)
Monocytes Absolute: 0.6 10*3/uL (ref 0.1–1.0)
Monocytes Relative: 8 %
Neutro Abs: 5.4 10*3/uL (ref 1.7–7.7)
Neutrophils Relative %: 75 %
Platelets: 178 10*3/uL (ref 150–400)
RBC: 5.06 MIL/uL (ref 3.87–5.11)
RDW: 13.6 % (ref 11.5–15.5)
WBC: 7.3 10*3/uL (ref 4.0–10.5)
nRBC: 0 % (ref 0.0–0.2)

## 2020-01-10 LAB — CMP (CANCER CENTER ONLY)
ALT: 21 U/L (ref 0–44)
AST: 16 U/L (ref 15–41)
Albumin: 3.6 g/dL (ref 3.5–5.0)
Alkaline Phosphatase: 104 U/L (ref 38–126)
Anion gap: 12 (ref 5–15)
BUN: 8 mg/dL (ref 6–20)
CO2: 23 mmol/L (ref 22–32)
Calcium: 9 mg/dL (ref 8.9–10.3)
Chloride: 104 mmol/L (ref 98–111)
Creatinine: 0.75 mg/dL (ref 0.44–1.00)
GFR, Est AFR Am: >60 mL/min (ref >60)
GFR, Estimated: >60 mL/min (ref >60)
Glucose, Bld: 164 mg/dL — ABNORMAL HIGH (ref 70–99)
Potassium: 3.9 mmol/L (ref 3.5–5.1)
Sodium: 139 mmol/L (ref 135–145)
Total Bilirubin: 0.3 mg/dL (ref 0.3–1.2)
Total Protein: 7.2 g/dL (ref 6.5–8.1)

## 2020-01-10 LAB — LACTATE DEHYDROGENASE: LDH: 178 U/L (ref 98–192)

## 2020-01-10 LAB — FERRITIN: Ferritin: 64 ng/mL (ref 11–307)

## 2020-01-14 ENCOUNTER — Telehealth: Payer: Self-pay | Admitting: Hematology

## 2020-01-14 NOTE — Telephone Encounter (Signed)
Scheduled per 06/24 los, patient has been called and notified. 

## 2020-02-04 MED FILL — PROMACTA 25 MG TABLET: 25 | 30 days supply | Qty: 30 | Fill #2

## 2020-03-03 ENCOUNTER — Other Ambulatory Visit: Payer: Self-pay | Admitting: Hematology

## 2020-03-03 DIAGNOSIS — D693 Immune thrombocytopenic purpura: Secondary | ICD-10-CM

## 2020-03-03 NOTE — Telephone Encounter (Signed)
Please review for refill.  

## 2020-03-06 MED FILL — PROMACTA 25 MG TABLET: 25 | 30 days supply | Qty: 30 | Fill #0

## 2020-04-07 MED FILL — PROMACTA 25 MG TABLET: 25 | 30 days supply | Qty: 30 | Fill #1

## 2020-04-08 ENCOUNTER — Inpatient Hospital Stay: Payer: Medicaid Other | Attending: Hematology

## 2020-04-08 ENCOUNTER — Inpatient Hospital Stay (HOSPITAL_BASED_OUTPATIENT_CLINIC_OR_DEPARTMENT_OTHER): Payer: Medicaid Other | Admitting: Hematology

## 2020-04-08 ENCOUNTER — Other Ambulatory Visit: Payer: Self-pay

## 2020-04-08 VITALS — BP 157/99 | HR 86 | Temp 96.5°F | Resp 18 | Ht 62.0 in | Wt 341.1 lb

## 2020-04-08 DIAGNOSIS — Z79899 Other long term (current) drug therapy: Secondary | ICD-10-CM | POA: Diagnosis not present

## 2020-04-08 DIAGNOSIS — Z8049 Family history of malignant neoplasm of other genital organs: Secondary | ICD-10-CM | POA: Insufficient documentation

## 2020-04-08 DIAGNOSIS — D693 Immune thrombocytopenic purpura: Secondary | ICD-10-CM | POA: Diagnosis not present

## 2020-04-08 DIAGNOSIS — E039 Hypothyroidism, unspecified: Secondary | ICD-10-CM | POA: Diagnosis not present

## 2020-04-08 DIAGNOSIS — Z86711 Personal history of pulmonary embolism: Secondary | ICD-10-CM | POA: Diagnosis not present

## 2020-04-08 DIAGNOSIS — F411 Generalized anxiety disorder: Secondary | ICD-10-CM | POA: Insufficient documentation

## 2020-04-08 DIAGNOSIS — Z6841 Body Mass Index (BMI) 40.0 and over, adult: Secondary | ICD-10-CM | POA: Diagnosis not present

## 2020-04-08 DIAGNOSIS — D509 Iron deficiency anemia, unspecified: Secondary | ICD-10-CM | POA: Insufficient documentation

## 2020-04-08 DIAGNOSIS — Z833 Family history of diabetes mellitus: Secondary | ICD-10-CM | POA: Diagnosis not present

## 2020-04-08 DIAGNOSIS — E669 Obesity, unspecified: Secondary | ICD-10-CM | POA: Diagnosis not present

## 2020-04-08 DIAGNOSIS — G43909 Migraine, unspecified, not intractable, without status migrainosus: Secondary | ICD-10-CM | POA: Diagnosis not present

## 2020-04-08 DIAGNOSIS — Z9081 Acquired absence of spleen: Secondary | ICD-10-CM | POA: Diagnosis not present

## 2020-04-08 DIAGNOSIS — G35 Multiple sclerosis: Secondary | ICD-10-CM | POA: Diagnosis not present

## 2020-04-08 LAB — CMP (CANCER CENTER ONLY)
ALT: 20 U/L (ref 0–44)
AST: 17 U/L (ref 15–41)
Albumin: 3.6 g/dL (ref 3.5–5.0)
Alkaline Phosphatase: 95 U/L (ref 38–126)
Anion gap: 8 (ref 5–15)
BUN: 9 mg/dL (ref 6–20)
CO2: 25 mmol/L (ref 22–32)
Calcium: 8.8 mg/dL — ABNORMAL LOW (ref 8.9–10.3)
Chloride: 103 mmol/L (ref 98–111)
Creatinine: 0.68 mg/dL (ref 0.44–1.00)
GFR, Est AFR Am: >60 mL/min (ref >60)
GFR, Estimated: >60 mL/min (ref >60)
Glucose, Bld: 165 mg/dL — ABNORMAL HIGH (ref 70–99)
Potassium: 4.1 mmol/L (ref 3.5–5.1)
Sodium: 136 mmol/L (ref 135–145)
Total Bilirubin: 0.5 mg/dL (ref 0.3–1.2)
Total Protein: 7.3 g/dL (ref 6.5–8.1)

## 2020-04-08 LAB — CBC WITH DIFFERENTIAL/PLATELET
Abs Immature Granulocytes: 0.05 10*3/uL (ref 0.00–0.07)
Basophils Absolute: 0 10*3/uL (ref 0.0–0.1)
Basophils Relative: 0 %
Eosinophils Absolute: 0.2 10*3/uL (ref 0.0–0.5)
Eosinophils Relative: 2 %
HCT: 38.3 % (ref 36.0–46.0)
Hemoglobin: 12.6 g/dL (ref 12.0–15.0)
Immature Granulocytes: 1 %
Lymphocytes Relative: 15 %
Lymphs Abs: 1.1 10*3/uL (ref 0.7–4.0)
MCH: 25.1 pg — ABNORMAL LOW (ref 26.0–34.0)
MCHC: 32.9 g/dL (ref 30.0–36.0)
MCV: 76.3 fL — ABNORMAL LOW (ref 80.0–100.0)
Monocytes Absolute: 0.6 10*3/uL (ref 0.1–1.0)
Monocytes Relative: 8 %
Neutro Abs: 5.8 10*3/uL (ref 1.7–7.7)
Neutrophils Relative %: 74 %
Platelets: 207 10*3/uL (ref 150–400)
RBC: 5.02 MIL/uL (ref 3.87–5.11)
RDW: 16.1 % — ABNORMAL HIGH (ref 11.5–15.5)
WBC: 7.8 10*3/uL (ref 4.0–10.5)
nRBC: 0 % (ref 0.0–0.2)

## 2020-04-08 LAB — FERRITIN: Ferritin: 78 ng/mL (ref 11–307)

## 2020-04-08 NOTE — Progress Notes (Signed)
HEMATOLOGY/ONCOLOGY CLINIC NOTE  Date of Service: 04/08/20  PCP: Arthur:  F/u for continued management of ITP  HISTORY OF PRESENTING ILLNESS:   Natalia Swinton Virtue is a wonderful 44 y.o. 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  Patient was been managed for her chronic ITP by Dr Yvone Neu at [facility] in [place], [place] and recently moved to [place] and wants to establish care here.  As per review of outside records the patient was diagnosed with ITP since 2012 in [place] and has been treated with prednisone on off since then. She apparently had significant weight gain with prednisone and had chronically low platelets despite the prednisone. She was then started on Promacta 50 mg by mouth daily in June 2016 and had a very good response with platelet counts of more than 500k. Her Promacta was discontinued in July 2016. She had relapse of her ITP in September 2016 with platelet count was down to 49k any significant bleeding or bruising. He has been on variable doses of Promacta since then and reports that she is currently taking 25 mg on Monday Wednesday and Friday.  He has had previous iron deficiency due to heavy periods which was treated with oral iron replacement.  INTERVAL HISTORY:  Ms Golinski is here for evaluation and management of her chronic ITP and iron deficiency anemia. The patient's last visit with [place] was on 01/10/2020. The pt reports that she is doing well overall.  The pt reports that her last menstrual cycle lasted 7-8 days and 2-3 of those days were heavy. She feels that this steady. Pt denies any issues tolerating Promacta. Pt has recently gotten over a viral infection. 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   Lab results today (04/08/20) of CBC w/diff  and CMP is as follows: all values are WNL except for MCV at 76.3, MCH at 25.1, RDW at 16.1, Glucose at 165, Calcium at 8.8. 04/08/2020 Ferritin at 78  On review of systems, pt reports heavy menstrual cycles and denies nose bleeds, gum bleeds and any other symptoms.   MEDICAL HISTORY:  #1 chronic ITP #2 Iron deficiency anemia due to heavy periods #3 hypothyroidism #4 degenerative arthritis #5 anxiety disorder #6 history of pulmonary embolism in 2004 status post IVC filter placement. 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 #7 migraine headaches  SURGICAL HISTORY: Past Surgical History:  Procedure Laterality Date  . C secton  2002  . COLONOSCOPY WITH PROPOFOL N/A 02/07/2018   Procedure: COLONOSCOPY WITH PROPOFOL;  Surgeon: Mauri Pole, MD;  Location: [facility];  Service: Endoscopy;  Laterality: N/A;  . IVC FILTER PLACEMENT ([facility] HX)  2004  . Left axillary lymph node excision biopsy  2008  . POLYPECTOMY  02/07/2018   Procedure: POLYPECTOMY;  Surgeon: Mauri Pole, MD;  Location: [facility];  Service: Endoscopy;;  Clips Placed    SOCIAL HISTORY: Social History   Socioeconomic History  . Marital status: Single    Spouse name: Not on file  . Number of children: Not on file  . Years of education: Not on file  . Highest education level: Not on file  Occupational History  . Not on file  Tobacco Use  .  Smoking status: Never Smoker  . Smokeless tobacco: Never Used  Vaping Use  . Vaping Use: Never used  Substance and Sexual Activity  . Alcohol use: Yes    Comment: occasional: not that often  . Drug use: No  . Sexual activity: Not on file  Other Topics Concern  . Not on file  Social History Narrative  . Not on file   Social Determinants of Health   Financial Resource Strain:   . Difficulty of Paying Living Expenses: Not on file  Food Insecurity:   .  Worried About Charity fundraiser in the Last Year: Not on file  . Ran Out of Food in the Last Year: Not on file  Transportation Needs:   . Lack of Transportation (Medical): Not on file  . Lack of Transportation (Non-Medical): Not on file  Physical Activity:   . Days of Exercise per Week: Not on file  . Minutes of Exercise per Session: Not on file  Stress:   . Feeling of Stress : Not on file  Social Connections:   . Frequency of Communication with Friends and Family: Not on file  . Frequency of Social Gatherings with Friends and Family: Not on file  . Attends Religious Services: Not on file  . Active Member of Clubs or Organizations: Not on file  . Attends Archivist Meetings: Not on file  . Marital Status: Not on file  Intimate Partner Violence:   . Fear of Current or Ex-Partner: Not on file  . Emotionally Abused: Not on file  . Physically Abused: Not on file  . Sexually Abused: Not on file  Recently moved from [place], [place] to [place]. Nonsmoker minimal social alcohol use no drug use Currently unemployed Has a 61 year old daughter  FAMILY HISTORY: No known history of blood disorders Mother had a history of cervical cancer  maternal grandmother diabetes type 2 Maternal uncle diabetes type 2  ALLERGIES:  is allergic to dextromethorphan and doxylamine succinate (sleep).  MEDICATIONS:  Current Outpatient Medications  Medication Sig Dispense Refill  . Cholecalciferol (VITAMIN D3 PO) Take 1 capsule by mouth daily.    . Cyanocobalamin (B-12) 1000 MCG SUBL Place 1,000 mcg under the tongue daily. 30 each 3  . ergocalciferol (VITAMIN D2) 1.25 MG (50000 UT) capsule Vitamin D2 1,250 mcg (50,000 unit) capsule  TK ONE C PO  ONCE A WEEK    . iron polysaccharides (NIFEREX) 150 MG capsule Take 1 capsule (150 mg total) by mouth 2 (two) times daily.    Marland Kitchen levothyroxine (SYNTHROID, LEVOTHROID) 100 MCG tablet Take 1 tablet (100 mcg total) by mouth daily before breakfast. 30  tablet 0  . Multiple Vitamin (MULTIVITAMIN) tablet Take 1 tablet by mouth daily.    Marland Kitchen PROMACTA 25 MG tablet TAKE 1 TABLET (25 MG TOTAL) BY MOUTH DAILY. TAKE ON AN EMPTY STOMACH 1 HR BEFORE OR 2 HR AFTER FOOD. SEPARATE FROM IRON SUPPLEMENT. 30 tablet 2   No current facility-administered medications for this visit.    REVIEW OF SYSTEMS:   A 10+ POINT REVIEW OF SYSTEMS WAS OBTAINED including neurology, dermatology, psychiatry, cardiac, respiratory, lymph, extremities, GI, GU, Musculoskeletal, constitutional, breasts, reproductive, HEENT.  All pertinent positives are noted in the HPI.  All others are negative.   PHYSICAL EXAMINATION: ECOG PERFORMANCE STATUS: 1 - Symptomatic but completely ambulatory  Vitals:   04/08/20 1010  BP: (!) 157/99  Pulse: 86  Resp: 18  Temp: (!) 96.5 F (35.8 C)  SpO2: 100%  Filed Weights   04/08/20 1010  Weight: (!) 341 lb 1.6 oz (154.7 kg)   .Body mass index is 62.39 kg/m.   Exam was given in a chair   GENERAL:alert, in no acute distress and comfortable SKIN: no acute rashes, no significant lesions EYES: conjunctiva are pink and non-injected, sclera anicteric OROPHARYNX: MMM, no exudates, no oropharyngeal erythema or ulceration NECK: supple, no JVD LYMPH:  no palpable lymphadenopathy in the cervical, axillary or inguinal regions LUNGS: clear to auscultation b/l with normal respiratory effort HEART: regular rate & rhythm ABDOMEN:  normoactive bowel sounds , non tender, not distended. No palpable hepatosplenomegaly.  Extremity: no pedal edema PSYCH: alert & oriented x 3 with fluent speech NEURO: no focal motor/sensory deficits  LABORATORY DATA:  I have reviewed the data as listed  . CBC Latest Ref Rng & Units 04/08/2020 01/10/2020 10/09/2019  WBC 4.0 - 10.5 K/uL 7.8 7.3 7.4  Hemoglobin 12.0 - 15.0 g/dL 12.6 12.3 12.9  Hematocrit 36 - 46 % 38.3 38.4 39.7  Platelets 150 - 400 K/uL 207 178 113(L)    CMP Latest Ref Rng & Units 04/08/2020 01/10/2020  10/09/2019  Glucose 70 - 99 mg/dL 165(H) 164(H) 209(H)  BUN 6 - 20 mg/dL 9 8 9   Creatinine 0.44 - 1.00 mg/dL 0.68 0.75 0.74  Sodium 135 - 145 mmol/L 136 139 140  Potassium 3.5 - 5.1 mmol/L 4.1 3.9 4.0  Chloride 98 - 111 mmol/L 103 104 104  CO2 22 - 32 mmol/L 25 23 24   Calcium 8.9 - 10.3 mg/dL 8.8(L) 9.0 9.1  Total Protein 6.5 - 8.1 g/dL 7.3 7.2 7.2  Total Bilirubin 0.3 - 1.2 mg/dL 0.5 0.3 0.4  Alkaline Phos 38 - 126 U/L 95 104 106  AST 15 - 41 U/L 17 16 22   ALT 0 - 44 U/L 20 21 23    . Lab Results  Component Value Date   IRON 94 10/09/2019   TIBC 408 10/09/2019   IRONPCTSAT 23 10/09/2019   (Iron and TIBC)  Lab Results  Component Value Date   FERRITIN 78 04/08/2020    RADIOGRAPHIC STUDIES: I have personally reviewed the radiological images as listed and agreed with the findings in the report. No results found.  ASSESSMENT & PLAN:   44 y.o. African-American female with   1) Chronic ITP since 2012 now with acute relapse of her ITP with very labile platelet counts. No overt bleeding at this time. Patient has been on and off steroids and has been on Promacta since 2016. Was apparently on Promacta 25mg  po MWF. No issues with bleeding at this time. No petechiae B12 wnl. Ultrasound abdomen 06/02/2016- did show some mild splenomegaly which could be an additional factor in her thrombocytopenia. The spleen measures 12.9 x 13.9 x 6.7 cm. The calculated volume is 628 cc. Unclear etiology of splenomegaly. Liver appeared normal.  Patient's platelet counts have normalized after treatment with Rituxan weekly 4 doses with platelets improved temporarily but she needed additional treatment.  PLAN:  -Discussed pt labwork today, 04/08/20; PLT are at goal (>50K), Hgb is improved, blood chemistries look good, Ferritin is stable.  -Advised pt that due to her ITP being steroid responsive she has about at 70% change of a splenectomy putting her into remission. -Advised pt that long-term use of  Promacta can cause scarring of the bone marrow. Some of these changes were found to be reversible.  -Discussed CDC guidelines regarding the COVID19 booster. Not the strongest recommendation for her to receive at  this time. -Recommend pt receive the annual flu vaccine as soon as possible. -Recommend pt continue 25 mg Promacta 6 days per week. No prohibitive toxicities.  -Will continue to slowly reduce the dose of Promacta as not to drop PLT.  -Continue taking a daily multi-vitamin and 150 mg Iron Polysaccharide BID. -Continue avoiding NSAIDS, ASA, and similar products. -Will see back in 2 months with labs  2) Iron deficiency Anemia - likely from heavy menstrual losses. -Tolerating the po iron without any significant issues. She previously was not taking PO iron for a period of time because she states that she forgot to pick up her medication. I previously encouraged her to be more compliant with her medication to improve her iron levels to avoid IV iron. She understands and is agreeable.   Lab Results  Component Value Date   FERRITIN 78 04/08/2020   Plan  -Continue oral iron replacement with iron polysaccharide 150 mg by mouth twice a day till ferritin close to 100 -Her periods/menorrhagia remain overall stable.   3)  Hypothyroidism - on levothyroxine As per primary care physician -continue Mx per PCP  4) . Patient Active Problem List   Diagnosis Date Noted  . Body mass index (BMI) of 50.0 to 59.9 in adult [facility])   . BRBPR (bright red blood per rectum)   . Polyp of descending colon   . Encntr for gyn exam (general) (routine) w/o abn findings 10/13/2016  . Exposure to STD 10/13/2016  . Chronic ITP (idiopathic thrombocytopenia) (H[facility]) 05/03/2016  . Thrombocytopenia ([facility]) 04/19/2016  . Iron deficiency anemia 04/19/2016  . Hypothyroidism 04/19/2016  . Degenerative arthritis 04/19/2016  . Generalized anxiety disorder 04/19/2016  -continue f/u with PCP  5) Obesity  -discussed lifestyle  modifications including need for appropriate diet and exercise. Encouraged her to take at least 10,000 steps a day and have a balanced healthy diet.  6) H/o PE in 2004-- still has IVC filter in situ. Thought to be triggered by immobility and obesity. -not on anticoagulation at this time.  FOLLOW UP: RTC with Dr Irene Limbo in 2 months with labs   The total time spent in the appt was 20 minutes and more than 50% was on counseling and direct patient cares.  All of the patient's questions were answered with apparent satisfaction. The patient knows to call the clinic with any problems, questions or concerns.   Sullivan Lone MD [place] AAHIVMS [facility] Hematology/Oncology Physician [facility]  (Office):       [phone] (Work cell):  [phone] (Fax):           [phone]  I, Yevette Edwards, am acting as a scribe for Dr. Sullivan Lone.   .I have reviewed the above documentation for accuracy and completeness, and I agree with the above. Brunetta Genera MD

## 2020-05-12 MED FILL — PROMACTA 25 MG TABLET: 25 | 30 days supply | Qty: 30 | Fill #2

## 2020-05-30 ENCOUNTER — Telehealth: Payer: Self-pay | Admitting: Hematology

## 2020-05-30 NOTE — Telephone Encounter (Signed)
Scheduled per los, patient has been called and notified. 

## 2020-06-03 ENCOUNTER — Other Ambulatory Visit: Payer: Self-pay | Admitting: Hematology

## 2020-06-03 DIAGNOSIS — D693 Immune thrombocytopenic purpura: Secondary | ICD-10-CM

## 2020-06-09 ENCOUNTER — Inpatient Hospital Stay (HOSPITAL_BASED_OUTPATIENT_CLINIC_OR_DEPARTMENT_OTHER): Payer: Medicaid Other | Admitting: Hematology

## 2020-06-09 ENCOUNTER — Other Ambulatory Visit: Payer: Self-pay | Admitting: *Deleted

## 2020-06-09 ENCOUNTER — Other Ambulatory Visit: Payer: Self-pay

## 2020-06-09 ENCOUNTER — Inpatient Hospital Stay: Payer: Medicaid Other | Attending: Hematology

## 2020-06-09 DIAGNOSIS — Z8049 Family history of malignant neoplasm of other genital organs: Secondary | ICD-10-CM | POA: Diagnosis not present

## 2020-06-09 DIAGNOSIS — E039 Hypothyroidism, unspecified: Secondary | ICD-10-CM | POA: Diagnosis not present

## 2020-06-09 DIAGNOSIS — D693 Immune thrombocytopenic purpura: Secondary | ICD-10-CM | POA: Diagnosis not present

## 2020-06-09 DIAGNOSIS — Z79899 Other long term (current) drug therapy: Secondary | ICD-10-CM | POA: Insufficient documentation

## 2020-06-09 DIAGNOSIS — Z833 Family history of diabetes mellitus: Secondary | ICD-10-CM | POA: Insufficient documentation

## 2020-06-09 DIAGNOSIS — J302 Other seasonal allergic rhinitis: Secondary | ICD-10-CM | POA: Diagnosis not present

## 2020-06-09 DIAGNOSIS — Z86711 Personal history of pulmonary embolism: Secondary | ICD-10-CM | POA: Insufficient documentation

## 2020-06-09 DIAGNOSIS — E669 Obesity, unspecified: Secondary | ICD-10-CM | POA: Diagnosis not present

## 2020-06-09 DIAGNOSIS — F411 Generalized anxiety disorder: Secondary | ICD-10-CM | POA: Diagnosis not present

## 2020-06-09 DIAGNOSIS — M1990 Unspecified osteoarthritis, unspecified site: Secondary | ICD-10-CM | POA: Insufficient documentation

## 2020-06-09 DIAGNOSIS — Z79890 Hormone replacement therapy (postmenopausal): Secondary | ICD-10-CM | POA: Insufficient documentation

## 2020-06-09 DIAGNOSIS — G43909 Migraine, unspecified, not intractable, without status migrainosus: Secondary | ICD-10-CM | POA: Diagnosis not present

## 2020-06-09 DIAGNOSIS — D509 Iron deficiency anemia, unspecified: Secondary | ICD-10-CM

## 2020-06-09 DIAGNOSIS — Z560 Unemployment, unspecified: Secondary | ICD-10-CM | POA: Insufficient documentation

## 2020-06-09 DIAGNOSIS — Z6841 Body Mass Index (BMI) 40.0 and over, adult: Secondary | ICD-10-CM | POA: Insufficient documentation

## 2020-06-09 LAB — CBC WITH DIFFERENTIAL (CANCER CENTER ONLY)
Abs Immature Granulocytes: 0.07 10*3/uL (ref 0.00–0.07)
Basophils Absolute: 0 10*3/uL (ref 0.0–0.1)
Basophils Relative: 0 %
Eosinophils Absolute: 0.2 10*3/uL (ref 0.0–0.5)
Eosinophils Relative: 2 %
HCT: 39.3 % (ref 36.0–46.0)
Hemoglobin: 12.9 g/dL (ref 12.0–15.0)
Immature Granulocytes: 1 %
Lymphocytes Relative: 15 %
Lymphs Abs: 1 10*3/uL (ref 0.7–4.0)
MCH: 25.8 pg — ABNORMAL LOW (ref 26.0–34.0)
MCHC: 32.8 g/dL (ref 30.0–36.0)
MCV: 78.6 fL — ABNORMAL LOW (ref 80.0–100.0)
Monocytes Absolute: 0.6 10*3/uL (ref 0.1–1.0)
Monocytes Relative: 9 %
Neutro Abs: 4.8 10*3/uL (ref 1.7–7.7)
Neutrophils Relative %: 73 %
Platelet Count: 67 10*3/uL — ABNORMAL LOW (ref 150–400)
RBC: 5 MIL/uL (ref 3.87–5.11)
RDW: 13.2 % (ref 11.5–15.5)
WBC Count: 6.6 10*3/uL (ref 4.0–10.5)
nRBC: 0 % (ref 0.0–0.2)

## 2020-06-09 LAB — IRON AND TIBC
Iron: 65 ug/dL (ref 41–142)
Saturation Ratios: 17 % — ABNORMAL LOW (ref 21–57)
TIBC: 381 ug/dL (ref 236–444)
UIBC: 316 ug/dL (ref 120–384)

## 2020-06-09 LAB — CMP (CANCER CENTER ONLY)
ALT: 28 U/L (ref 0–44)
AST: 25 U/L (ref 15–41)
Albumin: 3.5 g/dL (ref 3.5–5.0)
Alkaline Phosphatase: 91 U/L (ref 38–126)
Anion gap: 7 (ref 5–15)
BUN: 11 mg/dL (ref 6–20)
CO2: 28 mmol/L (ref 22–32)
Calcium: 9.2 mg/dL (ref 8.9–10.3)
Chloride: 104 mmol/L (ref 98–111)
Creatinine: 0.81 mg/dL (ref 0.44–1.00)
GFR, Estimated: >60 mL/min (ref >60)
Glucose, Bld: 139 mg/dL — ABNORMAL HIGH (ref 70–99)
Potassium: 3.9 mmol/L (ref 3.5–5.1)
Sodium: 139 mmol/L (ref 135–145)
Total Bilirubin: 0.4 mg/dL (ref 0.3–1.2)
Total Protein: 7.7 g/dL (ref 6.5–8.1)

## 2020-06-09 LAB — FERRITIN: Ferritin: 107 ng/mL (ref 11–307)

## 2020-06-09 MED ORDER — ELTROMBOPAG OLAMINE 25 MG PO TABS: 25.0000 mg | ORAL_TABLET | Freq: Every day | ORAL | 2 refills | Status: DC

## 2020-06-09 MED FILL — PROMACTA 25 MG TABLET: 25 | 30 days supply | Qty: 30 | Fill #0

## 2020-06-09 NOTE — Progress Notes (Signed)
HEMATOLOGY/ONCOLOGY CLINIC NOTE  Date of Service: 06/09/20  PCP: [place]:  F/u for continued management of ITP  HISTORY OF PRESENTING ILLNESS:   Kiara Barrera is a wonderful 44 y.o. 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  Patient was been managed for her chronic ITP by Dr Yvone Neu at [facility] in [place], [place] and recently moved to [place] and wants to establish care here.  As per review of outside records the patient was diagnosed with ITP since 2012 in [place] and has been treated with prednisone on off since then. She apparently had significant weight gain with prednisone and had chronically low platelets despite the prednisone. She was then started on Promacta 50 mg by mouth daily in June 2016 and had a very good response with platelet counts of more than 500k. Her Promacta was discontinued in July 2016. She had relapse of her ITP in September 2016 with platelet count was down to 49k any significant bleeding or bruising. He has been on variable doses of Promacta since then and reports that she is currently taking 25 mg on Monday Wednesday and Friday.  He has had previous iron deficiency due to heavy periods which was treated with oral iron replacement.  INTERVAL HISTORY:  Kiara Barrera is here for evaluation and management of her chronic ITP and iron deficiency anemia. The patient's last visit with [place] was on 04/08/2020. The pt reports that she is doing well overall.  The pt reports that she has felt well and continues taking Promacta six days per week. She has been experiencing throat itching and cough recently. Pt endorses seasonal allergies. She received her COVID19 booster on 06/07/20 and tolerated it well. Her menstrual cycles have been steady. She continues  to have seven day menstrual cycles with three days being heavy. She continues taking PO Iron.   Lab results today (06/09/20) of CBC w/diff and CMP is as follows: all values are WNL except for MCV at 78.6, MCH at 25.8, PLT at 67K, Glucose at 139. 06/09/2020 Ferritin at 107 06/09/2020 Iron Panel is as follows: Iron at 65, TIBC at 381, Sat Ratios at 17, UIBC at 316  On review of systems, pt reports itchy throat, frequent cough and denies abdominal pain and any other symptoms.   MEDICAL HISTORY:  #1 chronic ITP #2 Iron deficiency anemia due to heavy periods #3 hypothyroidism #4 degenerative arthritis #5 anxiety disorder #6 history of pulmonary embolism in 2004 status post IVC filter placement. 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 #7 migraine headaches  SURGICAL HISTORY: Past Surgical History:  Procedure Laterality Date  . C secton  2002  . COLONOSCOPY WITH PROPOFOL N/A 02/07/2018   Procedure: COLONOSCOPY WITH PROPOFOL;  Surgeon: Mauri Pole, MD;  Location: [facility];  Service: Endoscopy;  Laterality: N/A;  . IVC FILTER PLACEMENT ([place] HX)  2004  . Left axillary lymph node excision biopsy  2008  . POLYPECTOMY  02/07/2018   Procedure: POLYPECTOMY;  Surgeon: Mauri Pole, MD;  Location: [facility];  Service: Endoscopy;;  Clips Placed    SOCIAL HISTORY: Social History   Socioeconomic History  . Marital status: Single    Spouse name: Not on file  .  Number of children: Not on file  . Years of education: Not on file  . Highest education level: Not on file  Occupational History  . Not on file  Tobacco Use  . Smoking status: Never Smoker  . Smokeless tobacco: Never Used  Vaping Use  . Vaping Use: Never used  Substance and Sexual Activity  . Alcohol use: Yes    Comment: occasional: not that often  . Drug use: No  . Sexual activity: Not on file  Other Topics  Concern  . Not on file  Social History Narrative  . Not on file   Social Determinants of Health   Financial Resource Strain:   . Difficulty of Paying Living Expenses: Not on file  Food Insecurity:   . Worried About Charity fundraiser in the Last Year: Not on file  . Ran Out of Food in the Last Year: Not on file  Transportation Needs:   . Lack of Transportation (Medical): Not on file  . Lack of Transportation (Non-Medical): Not on file  Physical Activity:   . Days of Exercise per Week: Not on file  . Minutes of Exercise per Session: Not on file  Stress:   . Feeling of Stress : Not on file  Social Connections:   . Frequency of Communication with Friends and Family: Not on file  . Frequency of Social Gatherings with Friends and Family: Not on file  . Attends Religious Services: Not on file  . Active Member of Clubs or Organizations: Not on file  . Attends Archivist Meetings: Not on file  . Marital Status: Not on file  Intimate Partner Violence:   . Fear of Current or Ex-Partner: Not on file  . Emotionally Abused: Not on file  . Physically Abused: Not on file  . Sexually Abused: Not on file  Recently moved from [place], [place] to [place]. Nonsmoker minimal social alcohol use no drug use Currently unemployed Has a 20 year old daughter  FAMILY HISTORY: No known history of blood disorders Mother had a history of cervical cancer  maternal grandmother diabetes type 2 Maternal uncle diabetes type 2  ALLERGIES:  is allergic to dextromethorphan and doxylamine succinate (sleep).  MEDICATIONS:  Current Outpatient Medications  Medication Sig Dispense Refill  . Cholecalciferol (VITAMIN D3 PO) Take 1 capsule by mouth daily.    . Cyanocobalamin (B-12) 1000 MCG SUBL Place 1,000 mcg under the tongue daily. 30 each 3  . eltrombopag (PROMACTA) 25 MG tablet Take 1 tablet (25 mg total) by mouth daily. Take on an empty stomach, 1 hour before a meal or 2 hours after. 30 tablet  2  . ergocalciferol (VITAMIN D2) 1.25 MG (50000 UT) capsule Vitamin D2 1,250 mcg (50,000 unit) capsule  TK ONE C PO  ONCE A WEEK    . iron polysaccharides (NIFEREX) 150 MG capsule Take 1 capsule (150 mg total) by mouth 2 (two) times daily.    Marland Kitchen levothyroxine (SYNTHROID, LEVOTHROID) 100 MCG tablet Take 1 tablet (100 mcg total) by mouth daily before breakfast. 30 tablet 0  . Multiple Vitamin (MULTIVITAMIN) tablet Take 1 tablet by mouth daily.     No current facility-administered medications for this visit.    REVIEW OF SYSTEMS:   A 10+ POINT REVIEW OF SYSTEMS WAS OBTAINED including neurology, dermatology, psychiatry, cardiac, respiratory, lymph, extremities, GI, GU, Musculoskeletal, constitutional, breasts, reproductive, HEENT.  All pertinent positives are noted in the HPI.  All others are negative.   PHYSICAL EXAMINATION: ECOG PERFORMANCE STATUS: 1 -  Symptomatic but completely ambulatory  Vitals:   06/09/20 1100  BP: (!) 154/106  Pulse: 88  Resp: 18  Temp: 97.8 F (36.6 C)  SpO2: 97%   Filed Weights   06/09/20 1100  Weight: (!) 340 lb 14.4 oz (154.6 kg)   .Body mass index is 62.35 kg/m.   Exam was given in a chair   GENERAL:alert, in no acute distress and comfortable SKIN: no acute rashes, no significant lesions EYES: conjunctiva are pink and non-injected, sclera anicteric OROPHARYNX: MMM, no exudates, no oropharyngeal erythema or ulceration NECK: supple, no JVD LYMPH:  no palpable lymphadenopathy in the cervical, axillary or inguinal regions LUNGS: clear to auscultation b/l with normal respiratory effort HEART: regular rate & rhythm ABDOMEN:  normoactive bowel sounds , non tender, not distended. No palpable hepatosplenomegaly.  Extremity: no pedal edema PSYCH: alert & oriented x 3 with fluent speech NEURO: no focal motor/sensory deficits  LABORATORY DATA:  I have reviewed the data as listed  . CBC Latest Ref Rng & Units 06/09/2020 04/08/2020 01/10/2020  WBC 4.0 -  10.5 K/uL 6.6 7.8 7.3  Hemoglobin 12.0 - 15.0 g/dL 12.9 12.6 12.3  Hematocrit 36 - 46 % 39.3 38.3 38.4  Platelets 150 - 400 K/uL 67(L) 207 178    CMP Latest Ref Rng & Units 06/09/2020 04/08/2020 01/10/2020  Glucose 70 - 99 mg/dL 139(H) 165(H) 164(H)  BUN 6 - 20 mg/dL 11 9 8   Creatinine 0.44 - 1.00 mg/dL 0.81 0.68 0.75  Sodium 135 - 145 mmol/L 139 136 139  Potassium 3.5 - 5.1 mmol/L 3.9 4.1 3.9  Chloride 98 - 111 mmol/L 104 103 104  CO2 22 - 32 mmol/L 28 25 23   Calcium 8.9 - 10.3 mg/dL 9.2 8.8(L) 9.0  Total Protein 6.5 - 8.1 g/dL 7.7 7.3 7.2  Total Bilirubin 0.3 - 1.2 mg/dL 0.4 0.5 0.3  Alkaline Phos 38 - 126 U/L 91 95 104  AST 15 - 41 U/L 25 17 16   ALT 0 - 44 U/L 28 20 21    . Lab Results  Component Value Date   IRON 65 06/09/2020   TIBC 381 06/09/2020   IRONPCTSAT 17 (L) 06/09/2020   (Iron and TIBC)  Lab Results  Component Value Date   FERRITIN 107 06/09/2020    RADIOGRAPHIC STUDIES: I have personally reviewed the radiological images as listed and agreed with the findings in the report. No results found.  ASSESSMENT & PLAN:   44 y.o. African-American female with   1) Chronic ITP since 2012 now with acute relapse of her ITP with very labile platelet counts. No overt bleeding at this time. Patient has been on and off steroids and has been on Promacta since 2016. Was apparently on Promacta 25mg  po MWF. No issues with bleeding at this time. No petechiae B12 wnl. Ultrasound abdomen 06/02/2016- did show some mild splenomegaly which could be an additional factor in her thrombocytopenia. The spleen measures 12.9 x 13.9 x 6.7 cm. The calculated volume is 628 cc. Unclear etiology of splenomegaly. Liver appeared normal.  Patient's platelet counts have normalized after treatment with Rituxan weekly 4 doses with platelets improved temporarily but she needed additional treatment.  PLAN:  -Discussed pt labwork today, 06/09/20; no anemia, PLT have dropped but are are at goal  (>50K), blood chemistries are nml, Ferritin is WNL, Sat Ratios is low. -Advised pt to increase 25 mg Promacta to 7 days per week. The pt has no prohibitive toxicities at this time.  -Advised pt that  worsening thrombocytopenia could be caused by recent vaccination or seasonal allergies.  -Continue taking a daily multi-vitamin and 150 mg Iron Polysaccharide BID. -Continue avoiding NSAIDS, ASA, and similar products. -Will get rpt labs in 1 month -Will see back in 2 months with labs   2) Iron deficiency Anemia - likely from heavy menstrual losses. -Tolerating the po iron without any significant issues. She previously was not taking PO iron for a period of time because she states that she forgot to pick up her medication. I previously encouraged her to be more compliant with her medication to improve her iron levels to avoid IV iron. She understands and is agreeable.   Lab Results  Component Value Date   FERRITIN 107 06/09/2020   Plan  -Continue oral iron replacement with iron polysaccharide 150 mg by mouth twice a day till ferritin close to 100 -Her periods/menorrhagia remain overall stable.   3)  Hypothyroidism - on levothyroxine As per primary care physician -continue Mx per PCP  4) . Patient Active Problem List   Diagnosis Date Noted  . Body mass index (BMI) of 50.0 to 59.9 in adult [facility])   . BRBPR (bright red blood per rectum)   . Polyp of descending colon   . Encntr for gyn exam (general) (routine) w/o abn findings 10/13/2016  . Exposure to STD 10/13/2016  . Chronic ITP (idiopathic thrombocytopenia) (HCC) 05/03/2016  . Thrombocytopenia ([place]) 04/19/2016  . Iron deficiency anemia 04/19/2016  . Hypothyroidism 04/19/2016  . Degenerative arthritis 04/19/2016  . Generalized anxiety disorder 04/19/2016  -continue f/u with PCP  5) Obesity  -discussed lifestyle modifications including need for appropriate diet and exercise. Encouraged her to take at least 10,000 steps a day and have a  balanced healthy diet.  6) H/o PE in 2004-- still has IVC filter in situ. Thought to be triggered by immobility and obesity. -not on anticoagulation at this time.  FOLLOW UP: Labs in 1 month RTC with Dr Irene Limbo with labs in 2 months   The total time spent in the appt was 20 minutes and more than 50% was on counseling and direct patient cares.  All of the patient's questions were answered with apparent satisfaction. The patient knows to call the clinic with any problems, questions or concerns.   Sullivan Lone MD [place] AAHIVMS [facility] Hematology/Oncology Physician [facility]  (Office):       [phone] (Work cell):  [phone] (Fax):           [phone]  I, Yevette Edwards, am acting as a scribe for Dr. Sullivan Lone.   .I have reviewed the above documentation for accuracy and completeness, and I agree with the above. Brunetta Genera MD

## 2020-07-04 ENCOUNTER — Telehealth: Payer: Self-pay | Admitting: Hematology

## 2020-07-04 ENCOUNTER — Other Ambulatory Visit: Payer: Self-pay | Admitting: Family Medicine

## 2020-07-04 DIAGNOSIS — Z1231 Encounter for screening mammogram for malignant neoplasm of breast: Secondary | ICD-10-CM

## 2020-07-04 NOTE — Telephone Encounter (Signed)
Pt cld to reschedule her lab appt to an earlier time on 12/20. Appt has been r/s to 8am.

## 2020-07-07 ENCOUNTER — Inpatient Hospital Stay: Payer: Medicaid Other | Attending: Hematology

## 2020-07-07 ENCOUNTER — Other Ambulatory Visit: Payer: Medicaid Other

## 2020-07-07 ENCOUNTER — Other Ambulatory Visit: Payer: Self-pay

## 2020-07-07 ENCOUNTER — Other Ambulatory Visit: Payer: Self-pay | Admitting: *Deleted

## 2020-07-07 DIAGNOSIS — D509 Iron deficiency anemia, unspecified: Secondary | ICD-10-CM | POA: Diagnosis not present

## 2020-07-07 DIAGNOSIS — D693 Immune thrombocytopenic purpura: Secondary | ICD-10-CM | POA: Diagnosis present

## 2020-07-07 LAB — CBC WITH DIFFERENTIAL (CANCER CENTER ONLY)
Abs Immature Granulocytes: 0.06 10*3/uL (ref 0.00–0.07)
Basophils Absolute: 0 10*3/uL (ref 0.0–0.1)
Basophils Relative: 0 %
Eosinophils Absolute: 0.2 10*3/uL (ref 0.0–0.5)
Eosinophils Relative: 2 %
HCT: 42.1 % (ref 36.0–46.0)
Hemoglobin: 14 g/dL (ref 12.0–15.0)
Immature Granulocytes: 1 %
Lymphocytes Relative: 14 %
Lymphs Abs: 1.2 10*3/uL (ref 0.7–4.0)
MCH: 25.6 pg — ABNORMAL LOW (ref 26.0–34.0)
MCHC: 33.3 g/dL (ref 30.0–36.0)
MCV: 77 fL — ABNORMAL LOW (ref 80.0–100.0)
Monocytes Absolute: 0.7 10*3/uL (ref 0.1–1.0)
Monocytes Relative: 8 %
Neutro Abs: 6.7 10*3/uL (ref 1.7–7.7)
Neutrophils Relative %: 75 %
Platelet Count: 169 10*3/uL (ref 150–400)
RBC: 5.47 MIL/uL — ABNORMAL HIGH (ref 3.87–5.11)
RDW: 13.2 % (ref 11.5–15.5)
WBC Count: 8.8 10*3/uL (ref 4.0–10.5)
nRBC: 0 % (ref 0.0–0.2)

## 2020-07-07 LAB — CMP (CANCER CENTER ONLY)
ALT: 25 U/L (ref 0–44)
AST: 22 U/L (ref 15–41)
Albumin: 3.7 g/dL (ref 3.5–5.0)
Alkaline Phosphatase: 92 U/L (ref 38–126)
Anion gap: 12 (ref 5–15)
BUN: 13 mg/dL (ref 6–20)
CO2: 26 mmol/L (ref 22–32)
Calcium: 9.8 mg/dL (ref 8.9–10.3)
Chloride: 102 mmol/L (ref 98–111)
Creatinine: 0.81 mg/dL (ref 0.44–1.00)
GFR, Estimated: >60 mL/min (ref >60)
Glucose, Bld: 195 mg/dL — ABNORMAL HIGH (ref 70–99)
Potassium: 3.8 mmol/L (ref 3.5–5.1)
Sodium: 140 mmol/L (ref 135–145)
Total Bilirubin: 0.7 mg/dL (ref 0.3–1.2)
Total Protein: 8.3 g/dL — ABNORMAL HIGH (ref 6.5–8.1)

## 2020-07-07 LAB — FERRITIN: Ferritin: 116 ng/mL (ref 11–307)

## 2020-07-07 MED FILL — PROMACTA 25 MG TABLET: 25 | 30 days supply | Qty: 30 | Fill #1

## 2020-08-08 ENCOUNTER — Telehealth: Payer: Self-pay | Admitting: Hematology

## 2020-08-08 NOTE — Telephone Encounter (Signed)
Rescheduled appointment per 1/21 schedule message. Patient is aware of changes. 

## 2020-08-11 MED FILL — PROMACTA 25 MG TABLET: 25 | 30 days supply | Qty: 30 | Fill #2

## 2020-08-12 ENCOUNTER — Inpatient Hospital Stay: Payer: Medicaid Other | Admitting: Hematology

## 2020-08-12 NOTE — Progress Notes (Signed)
HEMATOLOGY/ONCOLOGY CLINIC NOTE  Date of Service: 08/12/20  PCP: Kiara Barrera:  F/u for continued management of ITP  HISTORY OF PRESENTING ILLNESS:   Kiara Barrera is a wonderful 45 y.o. 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  Patient was been managed for her chronic ITP by Dr Kiara Barrera at [facility] center in [place], [place] and recently moved to [place] and wants to establish care here.  As per review of outside records the patient was diagnosed with ITP since 2012 in [place] and has been treated with prednisone on off since then. She apparently had significant weight gain with prednisone and had chronically low platelets despite the prednisone. She was then started on Promacta 50 mg by mouth daily in June 2016 and had a very good response with platelet counts of more than 500k. Her Promacta was discontinued in July 2016. She had relapse of her ITP in September 2016 with platelet count was down to 49k any significant bleeding or bruising. He has been on variable doses of Promacta since then and reports that she is currently taking 25 mg on Monday Wednesday and Friday.  He has had previous iron deficiency due to heavy periods which was treated with oral iron replacement.  INTERVAL HISTORY:  Kiara Barrera is here for evaluation and management of her chronic ITP and iron deficiency anemia. The patient's last visit with [place] was on 06/09/2020. The pt reports that she is doing well overall.  The pt reports that she has received her COVID 19 vaccines and booster. She notes that her energy levels are the same as last visit, with no new symptoms or concerns.   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  The pt notes that her PCP has put her on  Hydrochlorothiazide for her increased blood pressure. The pt's thyroid levels have been stable and she has not experienced any bleeding problems. This has improved her blood pressure.  On review of systems, pt denies fevers, chills, night sweats, back pain, abdominal pain, changes in bowel habits, leg swelling and any other symptoms.   MEDICAL HISTORY:  #1 chronic ITP #2 Iron deficiency anemia due to heavy periods #3 hypothyroidism #4 degenerative arthritis #5 anxiety disorder #6 history of pulmonary embolism in 2004 status post IVC filter placement. 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 #7 migraine headaches  SURGICAL HISTORY: Past Surgical History:  Procedure Laterality Date  . C secton  2002  . COLONOSCOPY WITH PROPOFOL N/A 02/07/2018   Procedure: COLONOSCOPY WITH PROPOFOL;  Surgeon: Kiara Pole, MD;  [facility];  Service: Endoscopy;  Laterality: N/A;  . IVC FILTER PLACEMENT ([facility] HX)  2004  . Left axillary lymph node excision biopsy  2008  . POLYPECTOMY  02/07/2018   Procedure: POLYPECTOMY;  Surgeon: Kiara Pole, MD;  [facility];  Service: Endoscopy;;  Clips Placed    SOCIAL HISTORY: Social History   Socioeconomic History  . Marital status: Single    Spouse name: Not on file  . Number of children: Not on file  . Years of education: Not on file  . Highest education level: Not on file  Occupational History  .  Not on file  Tobacco Use  . Smoking status: Never Smoker  . Smokeless tobacco: Never Used  Vaping Use  . Vaping Use: Never used  Substance and Sexual Activity  . Alcohol use: Yes    Comment: occasional: not that often  . Drug use: No  . Sexual activity: Not on file  Other Topics Concern  . Not on file  Social History Narrative  . Not on file   Social Determinants of Health   Financial Resource Strain: Not on file   Food Insecurity: Not on file  Transportation Needs: Not on file  Physical Activity: Not on file  Stress: Not on file  Social Connections: Not on file  Intimate Partner Violence: Not on file  Recently moved from [place], [place] to [place]. Nonsmoker minimal social alcohol use no drug use Currently unemployed Has a 26 year old daughter  FAMILY HISTORY: No known history of blood disorders Mother had a history of cervical cancer  maternal grandmother diabetes type 2 Maternal uncle diabetes type 2  ALLERGIES:  is allergic to dextromethorphan and doxylamine succinate (sleep).  MEDICATIONS:  Current Outpatient Medications  Medication Sig Dispense Refill  . Cholecalciferol (VITAMIN D3 PO) Take 1 capsule by mouth daily.    . Cyanocobalamin (B-12) 1000 MCG SUBL Place 1,000 mcg under the tongue daily. 30 each 3  . eltrombopag (PROMACTA) 25 MG tablet Take 1 tablet (25 mg total) by mouth daily. Take on an empty stomach, 1 hour before a meal or 2 hours after. 30 tablet 2  . ergocalciferol (VITAMIN D2) 1.25 MG (50000 UT) capsule Vitamin D2 1,250 mcg (50,000 unit) capsule  TK ONE C PO  ONCE A WEEK    . iron polysaccharides (NIFEREX) 150 MG capsule Take 1 capsule (150 mg total) by mouth 2 (two) times daily.    Marland Kitchen levothyroxine (SYNTHROID, LEVOTHROID) 100 MCG tablet Take 1 tablet (100 mcg total) by mouth daily before breakfast. 30 tablet 0  . Multiple Vitamin (MULTIVITAMIN) tablet Take 1 tablet by mouth daily.     No current facility-administered medications for this visit.    REVIEW OF SYSTEMS:   10 Point review of Systems was done is negative except as noted above.  PHYSICAL EXAMINATION: ECOG PERFORMANCE STATUS: 1 - Symptomatic but completely ambulatory  There were no vitals filed for this visit. There were no vitals filed for this visit. .There is no height or weight on file to calculate BMI.   Exam was given in a chair.   GENERAL:alert, in no acute distress and comfortable SKIN:  no acute rashes, no significant lesions EYES: conjunctiva are pink and non-injected, sclera anicteric OROPHARYNX: MMM, no exudates, no oropharyngeal erythema or ulceration NECK: supple, no JVD LYMPH:  no palpable lymphadenopathy in the cervical, axillary or inguinal regions LUNGS: clear to auscultation b/l with normal respiratory effort HEART: regular rate & rhythm ABDOMEN:  normoactive bowel sounds , non tender, not distended. Extremity: no pedal edema PSYCH: alert & oriented x 3 with fluent speech NEURO: no focal motor/sensory deficits    LABORATORY DATA:  I have reviewed the data as listed  . CBC Latest Ref Rng & Units 07/07/2020 06/09/2020 04/08/2020  WBC 4.0 - 10.5 K/uL 8.8 6.6 7.8  Hemoglobin 12.0 - 15.0 g/dL 14.0 12.9 12.6  Hematocrit 36.0 - 46.0 % 42.1 39.3 38.3  Platelets 150 - 400 K/uL 169 67(L) 207    CMP Latest Ref Rng & Units 07/07/2020 06/09/2020 04/08/2020  Glucose 70 - 99 mg/dL 195(H) 139(H) 165(H)  BUN  6 - 20 mg/dL 13 11 9   Creatinine 0.44 - 1.00 mg/dL 0.81 0.81 0.68  Sodium 135 - 145 mmol/L 140 139 136  Potassium 3.5 - 5.1 mmol/L 3.8 3.9 4.1  Chloride 98 - 111 mmol/L 102 104 103  CO2 22 - 32 mmol/L 26 28 25   Calcium 8.9 - 10.3 mg/dL 9.8 9.2 8.8(L)  Total Protein 6.5 - 8.1 g/dL 8.3(H) 7.7 7.3  Total Bilirubin 0.3 - 1.2 mg/dL 0.7 0.4 0.5  Alkaline Phos 38 - 126 U/L 92 91 95  AST 15 - 41 U/L 22 25 17   ALT 0 - 44 U/L 25 28 20    . Lab Results  Component Value Date   IRON 65 06/09/2020   TIBC 381 06/09/2020   IRONPCTSAT 17 (L) 06/09/2020   (Iron and TIBC)  Lab Results  Component Value Date   FERRITIN 116 07/07/2020    RADIOGRAPHIC STUDIES: I have personally reviewed the radiological images as listed and agreed with the findings in the report. No results found.  ASSESSMENT & PLAN:   45 y.o. African-American female with   1) Chronic ITP since 2012 now with acute relapse of her ITP with very labile platelet counts. No overt bleeding at this  time. Patient has been on and off steroids and has been on Promacta since 2016. Was apparently on Promacta 25mg  po MWF. No issues with bleeding at this time. No petechiae B12 wnl. Ultrasound abdomen 06/02/2016- did show some mild splenomegaly which could be an additional factor in her thrombocytopenia. The spleen measures 12.9 x 13.9 x 6.7 cm. The calculated volume is 628 cc. Unclear etiology of splenomegaly. Liver appeared normal.  Patient's platelet counts have normalized after treatment with Rituxan weekly 4 doses with platelets improved temporarily but she needed additional treatment.  PLAN:  -Discussed pt prior labwork; plt's improved and Ferritin improved. -Continue 25 mg Promacta 7 days per week. The pt has no prohibitive toxicities at this time.  -Continue taking a daily multi-vitamin and 150 mg Iron Polysaccharide BID. -Continue avoiding NSAIDS, ASA, and similar products. -Will get labs today. -Will see back in 3 months with labs.  2) Iron deficiency Anemia - likely from heavy menstrual losses. -Tolerating the po iron without any significant issues. She previously was not taking PO iron for a period of time because she states that she forgot to pick up her medication. I previously encouraged her to be more compliant with her medication to improve her iron levels to avoid IV iron. She understands and is agreeable.   Lab Results  Component Value Date   FERRITIN 116 07/07/2020   Plan  -Continue oral iron replacement with iron polysaccharide 150 mg by mouth twice a day till ferritin close to 100 -Her periods/menorrhagia remain overall stable.   3)  Hypothyroidism - on levothyroxine As per primary care physician -continue Mx per PCP  4) . Patient Active Problem List   Diagnosis Date Noted  . Body mass index (BMI) of 50.0 to 59.9 in adult [facility])   . BRBPR (bright red blood per rectum)   . Polyp of descending colon   . Encntr for gyn exam (general) (routine) w/o abn findings  10/13/2016  . Exposure to STD 10/13/2016  . Chronic ITP (idiopathic thrombocytopenia) (HCC) 05/03/2016  . Thrombocytopenia ([place]) 04/19/2016  . Iron deficiency anemia 04/19/2016  . Hypothyroidism 04/19/2016  . Degenerative arthritis 04/19/2016  . Generalized anxiety disorder 04/19/2016  -continue f/u with PCP  5) Obesity  -discussed lifestyle modifications including need  for appropriate diet and exercise. Encouraged her to take at least 10,000 steps a day and have a balanced healthy diet.  6) H/o PE in 2004-- still has IVC filter in situ. Thought to be triggered by immobility and obesity. -not on anticoagulation at this time.  FOLLOW UP: Labs today RTC with Dr Irene Limbo with labs in 3 months    The total time spent in the appointment was 20 minutes and more than 50% was on counseling and direct patient cares.  All of the patient's questions were answered with apparent satisfaction. The patient knows to call the clinic with any problems, questions or concerns.   Sullivan Lone MD [place] AAHIVMS [facility] Hematology/Oncology Physician [facility]  (Office):       [phone] (Work cell):  [phone] (Fax):           [phone]  I, Reinaldo Raddle, am acting as scribe for Dr. Sullivan Lone, MD.    .I have reviewed the above documentation for accuracy and completeness, and I agree with the above. Brunetta Genera MD

## 2020-08-13 ENCOUNTER — Inpatient Hospital Stay: Payer: Medicaid Other

## 2020-08-13 ENCOUNTER — Inpatient Hospital Stay: Payer: Medicaid Other | Attending: Hematology | Admitting: Hematology

## 2020-08-13 ENCOUNTER — Other Ambulatory Visit: Payer: Self-pay

## 2020-08-13 VITALS — BP 148/91 | HR 93 | Temp 98.1°F | Resp 20 | Ht 62.0 in | Wt 335.5 lb

## 2020-08-13 DIAGNOSIS — R161 Splenomegaly, not elsewhere classified: Secondary | ICD-10-CM | POA: Insufficient documentation

## 2020-08-13 DIAGNOSIS — G43909 Migraine, unspecified, not intractable, without status migrainosus: Secondary | ICD-10-CM | POA: Diagnosis not present

## 2020-08-13 DIAGNOSIS — E669 Obesity, unspecified: Secondary | ICD-10-CM | POA: Insufficient documentation

## 2020-08-13 DIAGNOSIS — Z8049 Family history of malignant neoplasm of other genital organs: Secondary | ICD-10-CM | POA: Diagnosis not present

## 2020-08-13 DIAGNOSIS — Z86711 Personal history of pulmonary embolism: Secondary | ICD-10-CM | POA: Diagnosis not present

## 2020-08-13 DIAGNOSIS — M1990 Unspecified osteoarthritis, unspecified site: Secondary | ICD-10-CM | POA: Insufficient documentation

## 2020-08-13 DIAGNOSIS — I10 Essential (primary) hypertension: Secondary | ICD-10-CM | POA: Insufficient documentation

## 2020-08-13 DIAGNOSIS — N920 Excessive and frequent menstruation with regular cycle: Secondary | ICD-10-CM | POA: Insufficient documentation

## 2020-08-13 DIAGNOSIS — Z833 Family history of diabetes mellitus: Secondary | ICD-10-CM | POA: Insufficient documentation

## 2020-08-13 DIAGNOSIS — Z6841 Body Mass Index (BMI) 40.0 and over, adult: Secondary | ICD-10-CM | POA: Insufficient documentation

## 2020-08-13 DIAGNOSIS — E039 Hypothyroidism, unspecified: Secondary | ICD-10-CM | POA: Diagnosis not present

## 2020-08-13 DIAGNOSIS — D693 Immune thrombocytopenic purpura: Secondary | ICD-10-CM | POA: Diagnosis present

## 2020-08-13 DIAGNOSIS — Z79890 Hormone replacement therapy (postmenopausal): Secondary | ICD-10-CM | POA: Diagnosis not present

## 2020-08-13 DIAGNOSIS — D509 Iron deficiency anemia, unspecified: Secondary | ICD-10-CM | POA: Insufficient documentation

## 2020-08-13 DIAGNOSIS — F411 Generalized anxiety disorder: Secondary | ICD-10-CM | POA: Insufficient documentation

## 2020-08-13 LAB — CBC WITH DIFFERENTIAL/PLATELET
Abs Immature Granulocytes: 0.06 10*3/uL (ref 0.00–0.07)
Basophils Absolute: 0 10*3/uL (ref 0.0–0.1)
Basophils Relative: 0 %
Eosinophils Absolute: 0.1 10*3/uL (ref 0.0–0.5)
Eosinophils Relative: 2 %
HCT: 38.5 % (ref 36.0–46.0)
Hemoglobin: 12.8 g/dL (ref 12.0–15.0)
Immature Granulocytes: 1 %
Lymphocytes Relative: 16 %
Lymphs Abs: 1.2 10*3/uL (ref 0.7–4.0)
MCH: 25.8 pg — ABNORMAL LOW (ref 26.0–34.0)
MCHC: 33.2 g/dL (ref 30.0–36.0)
MCV: 77.6 fL — ABNORMAL LOW (ref 80.0–100.0)
Monocytes Absolute: 0.6 10*3/uL (ref 0.1–1.0)
Monocytes Relative: 8 %
Neutro Abs: 5.5 10*3/uL (ref 1.7–7.7)
Neutrophils Relative %: 73 %
Platelets: 223 10*3/uL (ref 150–400)
RBC: 4.96 MIL/uL (ref 3.87–5.11)
RDW: 14.9 % (ref 11.5–15.5)
WBC: 7.4 10*3/uL (ref 4.0–10.5)
nRBC: 0 % (ref 0.0–0.2)

## 2020-08-13 LAB — CMP (CANCER CENTER ONLY)
ALT: 20 U/L (ref 0–44)
AST: 20 U/L (ref 15–41)
Albumin: 3.6 g/dL (ref 3.5–5.0)
Alkaline Phosphatase: 74 U/L (ref 38–126)
Anion gap: 8 (ref 5–15)
BUN: 7 mg/dL (ref 6–20)
CO2: 27 mmol/L (ref 22–32)
Calcium: 9.2 mg/dL (ref 8.9–10.3)
Chloride: 104 mmol/L (ref 98–111)
Creatinine: 0.75 mg/dL (ref 0.44–1.00)
GFR, Estimated: >60 mL/min (ref >60)
Glucose, Bld: 146 mg/dL — ABNORMAL HIGH (ref 70–99)
Potassium: 3.7 mmol/L (ref 3.5–5.1)
Sodium: 139 mmol/L (ref 135–145)
Total Bilirubin: 0.7 mg/dL (ref 0.3–1.2)
Total Protein: 7.7 g/dL (ref 6.5–8.1)

## 2020-08-13 LAB — IMMATURE PLATELET FRACTION: Immature Platelet Fraction: 5.9 % (ref 1.2–8.6)

## 2020-08-14 ENCOUNTER — Ambulatory Visit
Admission: RE | Admit: 2020-08-14 | Discharge: 2020-08-14 | Disposition: A | Discharge status: Home / Self Care | Payer: Medicaid Other | Source: Ambulatory Visit | Attending: Family Medicine | Admitting: Family Medicine

## 2020-08-14 DIAGNOSIS — Z1231 Encounter for screening mammogram for malignant neoplasm of breast: Secondary | ICD-10-CM

## 2020-08-27 ENCOUNTER — Other Ambulatory Visit: Payer: Self-pay | Admitting: Family Medicine

## 2020-08-27 DIAGNOSIS — R928 Other abnormal and inconclusive findings on diagnostic imaging of breast: Secondary | ICD-10-CM

## 2020-08-30 ENCOUNTER — Ambulatory Visit
Admission: RE | Admit: 2020-08-30 | Discharge: 2020-08-30 | Disposition: A | Discharge status: Home / Self Care | Payer: Medicaid Other | Source: Ambulatory Visit | Attending: Family Medicine | Admitting: Family Medicine

## 2020-08-30 ENCOUNTER — Other Ambulatory Visit: Payer: Self-pay

## 2020-08-30 ENCOUNTER — Other Ambulatory Visit: Payer: Self-pay | Admitting: Family Medicine

## 2020-08-30 DIAGNOSIS — R928 Other abnormal and inconclusive findings on diagnostic imaging of breast: Secondary | ICD-10-CM

## 2020-09-10 ENCOUNTER — Other Ambulatory Visit: Payer: Self-pay | Admitting: Hematology

## 2020-09-10 DIAGNOSIS — D693 Immune thrombocytopenic purpura: Secondary | ICD-10-CM

## 2020-09-10 MED FILL — PROMACTA 25 MG TABLET: 25 | 30 days supply | Qty: 30 | Fill #0

## 2020-10-06 MED FILL — PROMACTA 25 MG TABLET: 25 | 30 days supply | Qty: 30 | Fill #1

## 2020-10-17 ENCOUNTER — Other Ambulatory Visit (HOSPITAL_COMMUNITY): Payer: Self-pay

## 2020-10-21 ENCOUNTER — Ambulatory Visit: Payer: Medicaid Other | Admitting: Obstetrics and Gynecology

## 2020-10-23 ENCOUNTER — Other Ambulatory Visit (HOSPITAL_COMMUNITY): Payer: Self-pay

## 2020-10-30 ENCOUNTER — Other Ambulatory Visit (HOSPITAL_COMMUNITY): Payer: Self-pay

## 2020-11-03 ENCOUNTER — Other Ambulatory Visit (HOSPITAL_COMMUNITY): Payer: Self-pay

## 2020-11-10 ENCOUNTER — Other Ambulatory Visit (HOSPITAL_COMMUNITY): Payer: Self-pay

## 2020-11-10 ENCOUNTER — Other Ambulatory Visit: Payer: Self-pay | Admitting: Hematology

## 2020-11-10 DIAGNOSIS — D693 Immune thrombocytopenic purpura: Secondary | ICD-10-CM

## 2020-11-10 MED ORDER — ELTROMBOPAG OLAMINE 25 MG PO TABS
ORAL_TABLET | ORAL | 1 refills | Status: DC
  Filled 2020-11-10: qty 30, 30d supply, fill #0
  Filled 2020-12-05: qty 30, 30d supply, fill #1

## 2020-11-10 NOTE — Progress Notes (Signed)
HEMATOLOGY/ONCOLOGY CLINIC NOTE  Date of Service: 11/11/20  PCP: Merrill:  F/u for continued management of ITP  HISTORY OF PRESENTING ILLNESS:   Kiara Barrera is a wonderful 45 y.o. 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  Patient was been managed for her chronic ITP by Dr Yvone Neu at [facility] in [place], [place] and recently moved to [place] and wants to establish care here.  As per review of outside records the patient was diagnosed with ITP since 2012 in [place] and has been treated with prednisone on off since then. She apparently had significant weight gain with prednisone and had chronically low platelets despite the prednisone. She was then started on Promacta 50 mg by mouth daily in June 2016 and had a very good response with platelet counts of more than 500k. Her Promacta was discontinued in July 2016. She had relapse of her ITP in September 2016 with platelet count was down to 49k any significant bleeding or bruising. He has been on variable doses of Promacta since then and reports that she is currently taking 25 mg on Monday Wednesday and Friday.  He has had previous iron deficiency due to heavy periods which was treated with oral iron replacement.  INTERVAL HISTORY:  Kiara Barrera is here for evaluation and management of her chronic ITP and iron deficiency anemia. The patient's last visit with [place] was on 08/13/2020. The pt reports that she is doing well overall.  The pt reports no new symptoms or concerns. The pt notes some seasonal allergies due to the pollen. The pt notes that she has been taking the iron twice daily as prescribed. The pt notes that she was put on HCTZ in January due to her blood pressure by her PCP. The pt believes that she had her Hgb  A1C and sugars evaluated by her PCP in January as well, but will get this re-checked soon. The pt notes that she experiences intermittent chest pain when she is laying down before she goes to sleep. The pt said this was more of a discomfort and went away with massaging it and sleep.  Lab results today 11/11/2020 of CBC w/diff and CMP is as follows: all values are WNL except for MCV of 78.5, Plt of 138K, Glucose of 197.  On review of systems, pt reports seasonal allergies and denies bleeding issues, fevers, chills, night sweats, SOB, chest pain upon exertion and any other symptoms.   MEDICAL HISTORY:  #1 chronic ITP #2 Iron deficiency anemia due to heavy periods #3 hypothyroidism #4 degenerative arthritis #5 anxiety disorder #6 history of pulmonary embolism in 2004 status post IVC filter placement. 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 #7 migraine headaches  SURGICAL HISTORY: Past Surgical History:  Procedure Laterality Date  . BREAST BIOPSY Left    Pt believes she had some kind of biopsy in [place] in left axilla area  . C secton  2002  . COLONOSCOPY WITH PROPOFOL N/A 02/07/2018   Procedure: COLONOSCOPY WITH PROPOFOL;  Surgeon: Mauri Pole, MD;  Location: [facility];  Service: Endoscopy;  Laterality: N/A;  . IVC FILTER PLACEMENT ([place] HX)  2004  . Left axillary lymph node excision biopsy  2008  . POLYPECTOMY  02/07/2018   Procedure: POLYPECTOMY;  Surgeon: Mauri Pole, MD;  Location: [facility];  Service: Endoscopy;;  Clips Placed    SOCIAL HISTORY: Social History   Socioeconomic History  . Marital status: Single    Spouse name: Not on file  . Number of children: Not on file  . Years of education: Not on file  . Highest education level: Not on file  Occupational History  . Not on file  Tobacco Use  . Smoking status: Never Smoker  . Smokeless tobacco: Never  Used  Vaping Use  . Vaping Use: Never used  Substance and Sexual Activity  . Alcohol use: Yes    Comment: occasional: not that often  . Drug use: No  . Sexual activity: Not on file  Other Topics Concern  . Not on file  Social History Narrative  . Not on file   Social Determinants of Health   Financial Resource Strain: Not on file  Food Insecurity: Not on file  Transportation Needs: Not on file  Physical Activity: Not on file  Stress: Not on file  Social Connections: Not on file  Intimate Partner Violence: Not on file  Recently moved from [place], [place] to [place]. Nonsmoker minimal social alcohol use no drug use Currently unemployed Has a 48 year old daughter  FAMILY HISTORY: No known history of blood disorders Mother had a history of cervical cancer  maternal grandmother diabetes type 2 Maternal uncle diabetes type 2  ALLERGIES:  is allergic to dextromethorphan and doxylamine succinate (sleep).  MEDICATIONS:  Current Outpatient Medications  Medication Sig Dispense Refill  . Cholecalciferol (VITAMIN D3 PO) Take 1 capsule by mouth daily.    . Cyanocobalamin (B-12) 1000 MCG SUBL Place 1,000 mcg under the tongue daily. 30 each 3  . eltrombopag (PROMACTA) 25 MG tablet TAKE 1 TABLET (25 MG TOTAL) BY MOUTH DAILY. TAKE ON AN EMPTY STOMACH 1 HR BEFORE OR 2 HR AFTER FOOD. SEPARATE FROM IRON SUPPLEMENT. 30 tablet 1  . ergocalciferol (VITAMIN D2) 1.25 MG (50000 UT) capsule Vitamin D2 1,250 mcg (50,000 unit) capsule  TK ONE C PO  ONCE A WEEK    . hydrochlorothiazide (HYDRODIURIL) 25 MG tablet Take 25 mg by mouth daily.    . iron polysaccharides (NIFEREX) 150 MG capsule Take 1 capsule (150 mg total) by mouth 2 (two) times daily.    Marland Kitchen levothyroxine (SYNTHROID, LEVOTHROID) 100 MCG tablet Take 1 tablet (100 mcg total) by mouth daily before breakfast. 30 tablet 0  . Multiple Vitamin (MULTIVITAMIN) tablet Take 1 tablet by mouth daily.     No current facility-administered  medications for this visit.    REVIEW OF SYSTEMS:   10 Point review of Systems was done is negative except as noted above.  PHYSICAL EXAMINATION: ECOG PERFORMANCE STATUS: 1 - Symptomatic but completely ambulatory  Vitals:   11/11/20 0900  BP: 127/75  Pulse: 87  Resp: 18  Temp: (!) 96.7 F (35.9 C)  SpO2: 97%   Filed Weights   11/11/20 0900  Weight: (!) 330 lb 14.4 oz (150.1 kg)   .Body mass index is 60.52 kg/m.    Exam was given in a chair.   GENERAL:alert, in no acute distress and comfortable SKIN: no acute rashes, no significant lesions EYES: conjunctiva are pink and non-injected, sclera anicteric OROPHARYNX: MMM, no exudates, no oropharyngeal erythema or ulceration NECK: supple, no JVD LYMPH:  no palpable lymphadenopathy in the cervical, axillary or inguinal regions LUNGS: clear to auscultation b/l  with normal respiratory effort HEART: regular rate & rhythm ABDOMEN:  normoactive bowel sounds , non tender, not distended. Extremity: no pedal edema PSYCH: alert & oriented x 3 with fluent speech NEURO: no focal motor/sensory deficits    LABORATORY DATA:  I have reviewed the data as listed  . CBC Latest Ref Rng & Units 11/11/2020 08/13/2020 07/07/2020  WBC 4.0 - 10.5 K/uL 7.1 7.4 8.8  Hemoglobin 12.0 - 15.0 g/dL 12.8 12.8 14.0  Hematocrit 36.0 - 46.0 % 38.3 38.5 42.1  Platelets 150 - 400 K/uL 138(L) 223 169    CMP Latest Ref Rng & Units 11/11/2020 08/13/2020 07/07/2020  Glucose 70 - 99 mg/dL 197(H) 146(H) 195(H)  BUN 6 - 20 mg/dL 12 7 13   Creatinine 0.44 - 1.00 mg/dL 0.76 0.75 0.81  Sodium 135 - 145 mmol/L 139 139 140  Potassium 3.5 - 5.1 mmol/L 3.9 3.7 3.8  Chloride 98 - 111 mmol/L 101 104 102  CO2 22 - 32 mmol/L 27 27 26   Calcium 8.9 - 10.3 mg/dL 9.0 9.2 9.8  Total Protein 6.5 - 8.1 g/dL 7.5 7.7 8.3(H)  Total Bilirubin 0.3 - 1.2 mg/dL 0.5 0.7 0.7  Alkaline Phos 38 - 126 U/L 87 74 92  AST 15 - 41 U/L 20 20 22   ALT 0 - 44 U/L 22 20 25    . Lab Results   Component Value Date   IRON 65 06/09/2020   TIBC 381 06/09/2020   IRONPCTSAT 17 (L) 06/09/2020   (Iron and TIBC)  Lab Results  Component Value Date   FERRITIN 116 07/07/2020    RADIOGRAPHIC STUDIES: I have personally reviewed the radiological images as listed and agreed with the findings in the report. No results found.  ASSESSMENT & PLAN:   45 y.o. African-American female with   1) Chronic ITP since 2012 now with acute relapse of her ITP with very labile platelet counts. No overt bleeding at this time. Patient has been on and off steroids and has been on Promacta since 2016. Was apparently on Promacta 25mg  po MWF. No issues with bleeding at this time. No petechiae B12 wnl. Ultrasound abdomen 06/02/2016- did show some mild splenomegaly which could be an additional factor in her thrombocytopenia. The spleen measures 12.9 x 13.9 x 6.7 cm. The calculated volume is 628 cc. Unclear etiology of splenomegaly. Liver appeared normal.  Patient's platelet counts have normalized after treatment with Rituxan weekly 4 doses with platelets improved temporarily but she needed additional treatment.  PLAN:  -Discussed pt labwork today, 11/11/2020; Plt and Hgb are stable. Still microcytic but the RDW is normal therefore continue iron supplementation. -Advised pt that her blood sugars have been running high. Recommended pt continue f/u w PCP regarding a Hgb A1C and fasted blood glucose levels again to r/o diabetes. -Advised pt that a blood glucose of 200 would be considered to be high. Advised pt to call PCP ASAP for evaluation. -Advised pt that HCTZ is a water pill for elevated blood pressure and unrelated to her blood sugars. -Continue 25 mg Promacta daily. The pt has no prohibitive toxicities at this time.  -Continue taking a daily multi-vitamin and 150 mg Iron Polysaccharide BID. -Continue avoiding NSAIDS, ASA, and similar products. -Will see back in 3 months with labs.    2) Iron  deficiency Anemia - likely from heavy menstrual losses. -Tolerating the po iron without any significant issues. She previously was not taking PO iron for a period of time because she states that she forgot  to pick up her medication. I previously encouraged her to be more compliant with her medication to improve her iron levels to avoid IV iron. She understands and is agreeable.   Lab Results  Component Value Date   FERRITIN 116 07/07/2020   Plan  -Continue oral iron replacement with iron polysaccharide 150 mg by mouth twice a day till ferritin close to 100 -Her periods/menorrhagia remain overall stable.   3)  Hypothyroidism - on levothyroxine As per primary care physician -continue Mx per PCP  4) . Patient Active Problem List   Diagnosis Date Noted  . Body mass index (BMI) of 50.0 to 59.9 in adult [facility])   . BRBPR (bright red blood per rectum)   . Polyp of descending colon   . Encntr for gyn exam (general) (routine) w/o abn findings 10/13/2016  . Exposure to STD 10/13/2016  . Chronic ITP (idiopathic thrombocytopenia) (HCC) 05/03/2016  . Thrombocytopenia ([place]) 04/19/2016  . Iron deficiency anemia 04/19/2016  . Hypothyroidism 04/19/2016  . Degenerative arthritis 04/19/2016  . Generalized anxiety disorder 04/19/2016  -continue f/u with PCP  5) Obesity  -discussed lifestyle modifications including need for appropriate diet and exercise. Encouraged her to take at least 10,000 steps a day and have a balanced healthy diet.  6) H/o PE in 2004-- still has IVC filter in situ. Thought to be triggered by immobility and obesity. -not on anticoagulation at this time.    FOLLOW UP: RTC with Dr Irene Limbo with labs in 3 months   The total time spent in the appointment was 20 minutes and more than 50% was on counseling and direct patient cares.  All of the patient's questions were answered with apparent satisfaction. The patient knows to call the clinic with any problems, questions or  concerns.   Sullivan Lone MD Melody Hill AAHIVMS [facility] Hematology/Oncology Physician [place]  (Office):       [phone] (Work cell):  [phone] (Fax):           [phone]  I, Reinaldo Raddle, am acting as scribe for Dr. Sullivan Lone, MD.   .I have reviewed the above documentation for accuracy and completeness, and I agree with the above. Brunetta Genera MD

## 2020-11-11 ENCOUNTER — Inpatient Hospital Stay: Payer: Medicaid Other

## 2020-11-11 ENCOUNTER — Other Ambulatory Visit (HOSPITAL_COMMUNITY): Payer: Self-pay

## 2020-11-11 ENCOUNTER — Telehealth: Payer: Self-pay | Admitting: Hematology

## 2020-11-11 ENCOUNTER — Other Ambulatory Visit: Payer: Self-pay

## 2020-11-11 ENCOUNTER — Inpatient Hospital Stay: Payer: Medicaid Other | Attending: Hematology | Admitting: Hematology

## 2020-11-11 VITALS — BP 127/75 | HR 87 | Temp 96.7°F | Resp 18 | Ht 62.0 in | Wt 330.9 lb

## 2020-11-11 DIAGNOSIS — R079 Chest pain, unspecified: Secondary | ICD-10-CM | POA: Diagnosis not present

## 2020-11-11 DIAGNOSIS — F419 Anxiety disorder, unspecified: Secondary | ICD-10-CM | POA: Diagnosis not present

## 2020-11-11 DIAGNOSIS — Z86711 Personal history of pulmonary embolism: Secondary | ICD-10-CM | POA: Diagnosis not present

## 2020-11-11 DIAGNOSIS — E669 Obesity, unspecified: Secondary | ICD-10-CM | POA: Diagnosis not present

## 2020-11-11 DIAGNOSIS — Z8049 Family history of malignant neoplasm of other genital organs: Secondary | ICD-10-CM | POA: Insufficient documentation

## 2020-11-11 DIAGNOSIS — D693 Immune thrombocytopenic purpura: Secondary | ICD-10-CM | POA: Insufficient documentation

## 2020-11-11 DIAGNOSIS — Z6841 Body Mass Index (BMI) 40.0 and over, adult: Secondary | ICD-10-CM | POA: Diagnosis not present

## 2020-11-11 DIAGNOSIS — Z833 Family history of diabetes mellitus: Secondary | ICD-10-CM | POA: Diagnosis not present

## 2020-11-11 DIAGNOSIS — R030 Elevated blood-pressure reading, without diagnosis of hypertension: Secondary | ICD-10-CM | POA: Diagnosis not present

## 2020-11-11 DIAGNOSIS — D500 Iron deficiency anemia secondary to blood loss (chronic): Secondary | ICD-10-CM | POA: Insufficient documentation

## 2020-11-11 DIAGNOSIS — D509 Iron deficiency anemia, unspecified: Secondary | ICD-10-CM | POA: Diagnosis not present

## 2020-11-11 DIAGNOSIS — M1990 Unspecified osteoarthritis, unspecified site: Secondary | ICD-10-CM | POA: Diagnosis not present

## 2020-11-11 DIAGNOSIS — Z79899 Other long term (current) drug therapy: Secondary | ICD-10-CM | POA: Insufficient documentation

## 2020-11-11 DIAGNOSIS — E039 Hypothyroidism, unspecified: Secondary | ICD-10-CM | POA: Diagnosis not present

## 2020-11-11 DIAGNOSIS — Z79890 Hormone replacement therapy (postmenopausal): Secondary | ICD-10-CM | POA: Diagnosis not present

## 2020-11-11 DIAGNOSIS — N920 Excessive and frequent menstruation with regular cycle: Secondary | ICD-10-CM | POA: Diagnosis not present

## 2020-11-11 LAB — CBC WITH DIFFERENTIAL/PLATELET
Abs Immature Granulocytes: 0.04 10*3/uL (ref 0.00–0.07)
Basophils Absolute: 0 10*3/uL (ref 0.0–0.1)
Basophils Relative: 0 %
Eosinophils Absolute: 0.1 10*3/uL (ref 0.0–0.5)
Eosinophils Relative: 2 %
HCT: 38.3 % (ref 36.0–46.0)
Hemoglobin: 12.8 g/dL (ref 12.0–15.0)
Immature Granulocytes: 1 %
Lymphocytes Relative: 15 %
Lymphs Abs: 1.1 10*3/uL (ref 0.7–4.0)
MCH: 26.2 pg (ref 26.0–34.0)
MCHC: 33.4 g/dL (ref 30.0–36.0)
MCV: 78.5 fL — ABNORMAL LOW (ref 80.0–100.0)
Monocytes Absolute: 0.6 10*3/uL (ref 0.1–1.0)
Monocytes Relative: 8 %
Neutro Abs: 5.3 10*3/uL (ref 1.7–7.7)
Neutrophils Relative %: 74 %
Platelets: 138 10*3/uL — ABNORMAL LOW (ref 150–400)
RBC: 4.88 MIL/uL (ref 3.87–5.11)
RDW: 13.2 % (ref 11.5–15.5)
WBC: 7.1 10*3/uL (ref 4.0–10.5)
nRBC: 0 % (ref 0.0–0.2)

## 2020-11-11 LAB — CMP (CANCER CENTER ONLY)
ALT: 22 U/L (ref 0–44)
AST: 20 U/L (ref 15–41)
Albumin: 3.7 g/dL (ref 3.5–5.0)
Alkaline Phosphatase: 87 U/L (ref 38–126)
Anion gap: 11 (ref 5–15)
BUN: 12 mg/dL (ref 6–20)
CO2: 27 mmol/L (ref 22–32)
Calcium: 9 mg/dL (ref 8.9–10.3)
Chloride: 101 mmol/L (ref 98–111)
Creatinine: 0.76 mg/dL (ref 0.44–1.00)
GFR, Estimated: >60 mL/min (ref >60)
Glucose, Bld: 197 mg/dL — ABNORMAL HIGH (ref 70–99)
Potassium: 3.9 mmol/L (ref 3.5–5.1)
Sodium: 139 mmol/L (ref 135–145)
Total Bilirubin: 0.5 mg/dL (ref 0.3–1.2)
Total Protein: 7.5 g/dL (ref 6.5–8.1)

## 2020-11-11 NOTE — Telephone Encounter (Signed)
Scheduled follow-up appointment per 4/26 los. Patient is aware. 

## 2020-11-25 ENCOUNTER — Ambulatory Visit: Payer: Medicaid Other | Admitting: Obstetrics and Gynecology

## 2020-12-04 ENCOUNTER — Other Ambulatory Visit: Payer: Self-pay

## 2020-12-04 ENCOUNTER — Encounter: Payer: Self-pay | Admitting: Obstetrics and Gynecology

## 2020-12-04 ENCOUNTER — Ambulatory Visit (INDEPENDENT_AMBULATORY_CARE_PROVIDER_SITE_OTHER): Payer: Medicaid Other | Admitting: Obstetrics and Gynecology

## 2020-12-04 DIAGNOSIS — N921 Excessive and frequent menstruation with irregular cycle: Secondary | ICD-10-CM | POA: Diagnosis not present

## 2020-12-04 NOTE — Progress Notes (Signed)
GYN presents for management of Fibroids.  Patient stated that she had a PAP in 07/2020

## 2020-12-04 NOTE — Patient Instructions (Addendum)
ENDOMETRIAL BIOPSY POST-PROCEDURE INSTRUCTIONS  1. You may take Ibuprofen, Aleve or Tylenol for pain if needed.  Cramping should resolve within in 24 hours.  2. You may have a small amount of spotting.  You should wear a mini pad for the next few days.  3. You may have intercourse after 24 hours.  4. You need to call if you have any pelvic pain, fever, heavy bleeding or foul smelling vaginal discharge.  5. Shower or bathe as normal  6. We will call you within one week with results or we will discuss   the results at your follow-up appointment if needed.  Endometrial Biopsy  An endometrial biopsy is a procedure to remove tissue samples from the endometrium, which is the lining of the uterus. The tissue that is removed can then be checked under a microscope for disease. This procedure is used to diagnose conditions such as endometrial cancer, endometrial tuberculosis, polyps, or other inflammatory conditions. This procedure may also be used to investigate uterine bleeding to determine where you are in your menstrual cycle or how your hormone levels are affecting the lining of the uterus. Tell a health care provider about:  Any allergies you have.  All medicines you are taking, including vitamins, herbs, eye drops, creams, and over-the-counter medicines.  Any problems you or family members have had with anesthetic medicines.  Any blood disorders you have.  Any surgeries you have had.  Any medical conditions you have.  Whether you are pregnant or may be pregnant. What are the risks? Generally, this is a safe procedure. However, problems may occur, including:  Bleeding.  Pelvic infection.  Puncture of the wall of the uterus with the biopsy device (rare).  Allergic reactions to medicines. What happens before the procedure?  Keep a record of your menstrual cycles as told by your health care provider. You may need to schedule your procedure for a specific time in your  cycle.  You may want to bring a sanitary pad to wear after the procedure.  Plan to have someone take you home from the hospital or clinic.  Ask your health care provider about: ? Changing or stopping your regular medicines. This is especially important if you are taking diabetes medicines, arthritis medicines, or blood thinners. ? Taking medicines such as aspirin and ibuprofen. These medicines can thin your blood. Do not take these medicines unless your health care provider tells you to take them. ? Taking over-the-counter medicines, vitamins, herbs, and supplements. What happens during the procedure?  You will lie on an exam table with your feet and legs supported as in a pelvic exam.  Your health care provider will insert an instrument (speculum) into your vagina to see your cervix.  Your cervix will be cleansed with an antiseptic solution.  A medicine (local anesthetic) will be used to numb the cervix.  A forceps instrument (tenaculum) will be used to hold your cervix steady for the biopsy.  A thin, rod-like instrument (uterine sound) will be inserted through your cervix to determine the length of your uterus and the location where the biopsy sample will be removed.  A thin, flexible tube (catheter) will be inserted through your cervix and into the uterus. The catheter will be used to collect the biopsy sample from your endometrial tissue.  The catheter and speculum will then be removed, and the tissue sample will be sent to a lab for examination. The procedure may vary among health care providers and hospitals. What can I expect  after procedure?  You will rest in a recovery area until you are ready to go home.  You may have mild cramping and a small amount of vaginal bleeding. This is normal.  You may have a small amount of vaginal bleeding for a few days. This is normal.  It is up to you to get the results of your procedure. Ask your health care provider, or the department  that is doing the procedure, when your results will be ready. Follow these instructions at home:  Take over-the-counter and prescription medicines only as told by your health care provider.  Do not douche, use tampons, or have sexual intercourse until your health care provider approves.  Return to your normal activities as told by your health care provider. Ask your health care provider what activities are safe for you.  Follow instructions from your health care provider about any activity restrictions, such as restrictions on strenuous exercise or heavy lifting.  Keep all follow-up visits. This is important. Contact a health care provider:  You have heavy bleeding, or bleed for longer than 2 days after the procedure.  You have bad smelling discharge from your vagina.  You have a fever or chills.  You have a burning sensation when urinating or you have difficulty urinating.  You have severe pain in your lower abdomen. Get help right away if you:  You have severe cramps in your stomach or back.  You pass large blood clots.  Your bleeding increases.  You become weak or light-headed, or you faint or lose consciousness. Summary  An endometrial biopsy is a procedure to remove tissue samples is taken from the endometrium, which is the lining of the uterus.  The tissue sample that is removed will be checked under a microscope for disease.  This procedure is used to diagnose conditions such as endometrial cancer, endometrial tuberculosis, polyps, or other inflammatory conditions.  After the procedure, it is common to have mild cramping and a small amount of vaginal bleeding for a few days.  Do not douche, use tampons, or have sexual intercourse until your health care provider approves. Ask your health care provider which activities are safe for you. This information is not intended to replace advice given to you by your health care provider. Make sure you discuss any questions you  have with your health care provider. Document Revised: 01/28/2020 Document Reviewed: 01/28/2020 Elsevier Patient Education  2021 [employer].

## 2020-12-05 ENCOUNTER — Other Ambulatory Visit (HOSPITAL_COMMUNITY): Payer: Self-pay

## 2020-12-05 NOTE — Progress Notes (Signed)
Patient ID: Kiara Barrera, female   DOB: 1976/03/06, 45 y.o.   MRN: [id number] [name] presents for evaluation of her heavy cycles. She reports monthly cycles. Last 5-7 days. H/O IDA suspected due to her cycles  H/O chronic ITP, followed by Heme/Onc H/O PE with IVC in placed, not on anticoagulation, suspected secondary to immobility of job at time of Dx H/O HTN and ? Early DM and morbid obesity  H/O C/S x 1  Pap smear UTD per pt   PE AF VSS Lungs clear Heart RRR Abd soft + BS obese GU Nl EGBUS bimanual exam limited by pt habitus  A/P [place]rg[place]ia        IDA        Chronic ITP        H/O PE with IVC in place        HTN/DM/Morbid obesity  Discussed heavy cycles with pt. Discussed that pt is not the ideal surgerical candidate and limited medical options due to chronic medical problems. Has used IUD in the past x 2 but expelled both.Will check GYN U/S. Discussed need for [facility] d/t to risk for endometrial abnormalities. Will perform with return visit and discuss GYN U/S results.

## 2020-12-17 ENCOUNTER — Other Ambulatory Visit (HOSPITAL_COMMUNITY): Payer: Self-pay

## 2020-12-18 ENCOUNTER — Ambulatory Visit (HOSPITAL_COMMUNITY)
Admission: RE | Admit: 2020-12-18 | Discharge: 2020-12-18 | Disposition: A | Discharge status: Home / Self Care | Payer: Medicaid Other | Source: Ambulatory Visit | Attending: Obstetrics and Gynecology | Admitting: Obstetrics and Gynecology

## 2020-12-18 ENCOUNTER — Other Ambulatory Visit: Payer: Self-pay

## 2020-12-18 DIAGNOSIS — N921 Excessive and frequent menstruation with irregular cycle: Secondary | ICD-10-CM | POA: Diagnosis present

## 2020-12-29 ENCOUNTER — Ambulatory Visit (INDEPENDENT_AMBULATORY_CARE_PROVIDER_SITE_OTHER): Payer: Medicaid Other | Admitting: Obstetrics and Gynecology

## 2020-12-29 ENCOUNTER — Other Ambulatory Visit (HOSPITAL_COMMUNITY): Payer: Self-pay

## 2020-12-29 ENCOUNTER — Other Ambulatory Visit: Payer: Self-pay | Admitting: Hematology

## 2020-12-29 ENCOUNTER — Other Ambulatory Visit (HOSPITAL_COMMUNITY)
Admission: RE | Admit: 2020-12-29 | Discharge: 2020-12-29 | Disposition: A | Discharge status: Home / Self Care | Payer: Medicaid Other | Source: Ambulatory Visit | Attending: Obstetrics and Gynecology | Admitting: Obstetrics and Gynecology

## 2020-12-29 ENCOUNTER — Encounter: Payer: Self-pay | Admitting: Obstetrics and Gynecology

## 2020-12-29 ENCOUNTER — Other Ambulatory Visit: Payer: Self-pay

## 2020-12-29 VITALS — BP 133/78 | HR 90 | Wt 332.0 lb

## 2020-12-29 DIAGNOSIS — N921 Excessive and frequent menstruation with irregular cycle: Secondary | ICD-10-CM

## 2020-12-29 DIAGNOSIS — D693 Immune thrombocytopenic purpura: Secondary | ICD-10-CM

## 2020-12-29 LAB — POCT URINE PREGNANCY: Preg Test, Ur: NEGATIVE

## 2020-12-29 MED ORDER — ELTROMBOPAG OLAMINE 25 MG PO TABS
ORAL_TABLET | ORAL | 1 refills | Status: DC
  Filled 2020-12-29: qty 30, 30d supply, fill #0
  Filled 2021-02-09: qty 30, 30d supply, fill #1

## 2020-12-29 NOTE — Progress Notes (Signed)
RGYN py here for F/U  U/ per notes EMBX.   LMP: 12/09/20 UPT: Neg Consent signed.

## 2020-12-29 NOTE — Patient Instructions (Signed)

## 2020-12-29 NOTE — Progress Notes (Signed)
  Ms [place] presents for EMBx and reviewed of U/S. U/S results reviewed with pt.    ENDOMETRIAL BIOPSY     The indications for endometrial biopsy were reviewed.   Risks of the biopsy including cramping, bleeding, infection, uterine perforation, inadequate specimen and need for additional procedures  were discussed. The patient states she understands and agrees to undergo procedure today. Consent was signed. Time out was performed. Urine HCG was negative. During the pelvic exam, the cervix was prepped with Betadine. A single-toothed tenaculum was placed on the anterior lip of the cervix to stabilize it. The 3 mm pipelle was introduced into the endometrial cavity without difficulty to a depth of 8cm, and a moderate amount of tissue was obtained and sent to pathology. The instruments were removed from the patient's vagina. Minimal bleeding from the cervix was noted. The patient tolerated the procedure well. Routine post-procedure instructions were given to the patient.      A/P AUB        IDA        Uterine fibroid         Morbid obesity         H/O PE       Tx options again reviewed with pt. Limited due to pt's medical problems. Will consider an IUD. Pt will be contacted with her EMBX results. F/U PRN

## 2020-12-30 LAB — SURGICAL PATHOLOGY

## 2020-12-31 ENCOUNTER — Other Ambulatory Visit (HOSPITAL_COMMUNITY): Payer: Self-pay

## 2021-01-01 ENCOUNTER — Other Ambulatory Visit (HOSPITAL_COMMUNITY): Payer: Self-pay

## 2021-01-30 ENCOUNTER — Other Ambulatory Visit (HOSPITAL_COMMUNITY): Payer: Self-pay

## 2021-02-09 ENCOUNTER — Other Ambulatory Visit (HOSPITAL_COMMUNITY): Payer: Self-pay

## 2021-02-09 NOTE — Progress Notes (Signed)
HEMATOLOGY/ONCOLOGY CLINIC NOTE  Date of Service: 02/09/21  PCP: [place]:  F/u for continued management of ITP  HISTORY OF PRESENTING ILLNESS:   Kiara Barrera is a wonderful 45 y.o. 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  Patient was been managed for her chronic ITP by Dr Yvone Neu at [facility] in [place], [place] and recently moved to [place] and wants to establish care here.  As per review of outside records the patient was diagnosed with ITP since 2012 in [place] and has been treated with prednisone on off since then. She apparently had significant weight gain with prednisone and had chronically low platelets despite the prednisone. She was then started on Promacta 50 mg by mouth daily in June 2016 and had a very good response with platelet counts of more than 500k. Her Promacta was discontinued in July 2016. She had relapse of her ITP in September 2016 with platelet count was down to 49k any significant bleeding or bruising. He has been on variable doses of Promacta since then and reports that she is currently taking 25 mg on Monday Wednesday and Friday.  He has had previous iron deficiency due to heavy periods which was treated with oral iron replacement.  INTERVAL HISTORY:  Kiara Barrera is here for evaluation and management of her chronic ITP and iron deficiency anemia. The patient's last visit with [place] was on 11/11/2020. The pt reports that she is doing well overall.  The pt reports no acute new symptoms, no bruising or bleeding.  No toxicities from taking her Promacta. Has gained about 5 pounds in the last 2 months. Notes that she has been having good time with her daughter but is keen for her to go back to her next semester in college.  Lab results today  02/10/2021 of CBC w/diff shows normal platelets of 160k normal hemoglobin of 12.9 with an MCV of 78, normal WBC count of 6.9k. CMP within normal limits other than a blood sugar of 170 Ferritin of 121 with an iron saturation of 35%.   On review of systems, pt reports no other acute new symptoms.    MEDICAL HISTORY:  #1 chronic ITP #2 Iron deficiency anemia due to heavy periods #3 hypothyroidism #4 degenerative arthritis #5 anxiety disorder #6 history of pulmonary embolism in 2004 status post IVC filter placement. 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 #7 migraine headaches  SURGICAL HISTORY: Past Surgical History:  Procedure Laterality Date   BREAST BIOPSY Left    Pt believes she had some kind of biopsy in [place] in left axilla area   C secton  2002   COLONOSCOPY WITH PROPOFOL N/A 02/07/2018   Procedure: COLONOSCOPY WITH PROPOFOL;  Surgeon: Mauri Pole, MD;  Location: [facility];  Service: Endoscopy;  Laterality: N/A;   IVC FILTER PLACEMENT ([place] HX)  2004   Left axillary lymph node excision biopsy  2008   POLYPECTOMY  02/07/2018   Procedure: POLYPECTOMY;  Surgeon: Mauri Pole, MD;  Location: [facility];  Service: Endoscopy;;  Clips Placed    SOCIAL HISTORY: Social History   Socioeconomic History   Marital status: Single    Spouse name: Not on file  Number of children: Not on file   Years of education: Not on file   Highest education level: Not on file  Occupational History   Not on file  Tobacco Use   Smoking status: Never   Smokeless tobacco: Never  Vaping Use   Vaping Use: Never used  Substance and Sexual Activity   Alcohol use: Yes    Comment: occasional: not that often   Drug use: Yes    Types: Marijuana   Sexual activity: Yes    Partners: Male    Birth control/protection: None  Other Topics Concern   Not on file  Social History  Narrative   Not on file   Social Determinants of Health   Financial Resource Strain: Not on file  Food Insecurity: Not on file  Transportation Needs: Not on file  Physical Activity: Not on file  Stress: Not on file  Social Connections: Not on file  Intimate Partner Violence: Not on file  Recently moved from [place], [place] to [place]. Nonsmoker minimal social alcohol use no drug use Currently unemployed Has a 34 year old daughter  FAMILY HISTORY: No known history of blood disorders Mother had a history of cervical cancer  maternal grandmother diabetes type 2 Maternal uncle diabetes type 2  ALLERGIES:  is allergic to dextromethorphan and doxylamine succinate (sleep).  MEDICATIONS:  Current Outpatient Medications  Medication Sig Dispense Refill   Cholecalciferol (VITAMIN D3 PO) Take 1 capsule by mouth daily.     Cyanocobalamin (B-12) 1000 MCG SUBL Place 1,000 mcg under the tongue daily. 30 each 3   eltrombopag (PROMACTA) 25 MG tablet TAKE 1 TABLET (25 MG TOTAL) BY MOUTH DAILY. TAKE ON AN EMPTY STOMACH 1 HR BEFORE OR 2 HR AFTER FOOD. SEPARATE FROM IRON SUPPLEMENT. 30 tablet 1   ergocalciferol (VITAMIN D2) 1.25 MG (50000 UT) capsule Vitamin D2 1,250 mcg (50,000 unit) capsule  TK ONE C PO  ONCE A WEEK     hydrochlorothiazide (HYDRODIURIL) 25 MG tablet Take 25 mg by mouth daily.     iron polysaccharides (NIFEREX) 150 MG capsule Take 1 capsule (150 mg total) by mouth 2 (two) times daily.     levothyroxine (SYNTHROID, LEVOTHROID) 100 MCG tablet Take 1 tablet (100 mcg total) by mouth daily before breakfast. 30 tablet 0   Multiple Vitamin (MULTIVITAMIN) tablet Take 1 tablet by mouth daily.     No current facility-administered medications for this visit.    REVIEW OF SYSTEMS:   10 Point review of Systems was done is negative except as noted above.  PHYSICAL EXAMINATION: ECOG PERFORMANCE STATUS: 1 - Symptomatic but completely ambulatory  Vitals:   02/10/21 0910  BP: 136/80   Pulse: 81  Resp: 18  Temp: 98.7 F (37.1 C)  SpO2: 99%    Filed Weights   02/10/21 0910  Weight: (!) 335 lb 14.4 oz (152.4 kg)    .Body mass index is 61.44 kg/m.    NAD GENERAL:alert, in no acute distress and comfortable SKIN: no acute rashes, no significant lesions EYES: conjunctiva are pink and non-injected, sclera anicteric OROPHARYNX: MMM, no exudates, no oropharyngeal erythema or ulceration NECK: supple, no JVD LYMPH:  no palpable lymphadenopathy in the cervical, axillary or inguinal regions LUNGS: clear to auscultation b/l with normal respiratory effort HEART: regular rate & rhythm ABDOMEN:  normoactive bowel sounds , non tender, not distended. Extremity: no pedal edema PSYCH: alert & oriented x 3 with fluent speech NEURO: no focal motor/sensory deficits    LABORATORY DATA:  I have reviewed  the data as listed  . CBC Latest Ref Rng & Units 02/10/2021 11/11/2020 08/13/2020  WBC 4.0 - 10.5 K/uL 6.9 7.1 7.4  Hemoglobin 12.0 - 15.0 g/dL 12.9 12.8 12.8  Hematocrit 36.0 - 46.0 % 37.9 38.3 38.5  Platelets 150 - 400 K/uL 160 138(L) 223    CMP Latest Ref Rng & Units 02/10/2021 11/11/2020 08/13/2020  Glucose 70 - 99 mg/dL 170(H) 197(H) 146(H)  BUN 6 - 20 mg/dL '7 12 7  '$ Creatinine 0.44 - 1.00 mg/dL 0.71 0.76 0.75  Sodium 135 - 145 mmol/L 139 139 139  Potassium 3.5 - 5.1 mmol/L 4.0 3.9 3.7  Chloride 98 - 111 mmol/L 104 101 104  CO2 22 - 32 mmol/L '26 27 27  '$ Calcium 8.9 - 10.3 mg/dL 9.3 9.0 9.2  Total Protein 6.5 - 8.1 g/dL 7.1 7.5 7.7  Total Bilirubin 0.3 - 1.2 mg/dL 0.5 0.5 0.7  Alkaline Phos 38 - 126 U/L 88 87 74  AST 15 - 41 U/L '19 20 20  '$ ALT 0 - 44 U/L '21 22 20   '$ . Lab Results  Component Value Date   IRON 133 02/10/2021   TIBC 380 02/10/2021   IRONPCTSAT 35 02/10/2021   (Iron and TIBC)  Lab Results  Component Value Date   FERRITIN 121 02/10/2021    RADIOGRAPHIC STUDIES: I have personally reviewed the radiological images as listed and agreed with the  findings in the report. No results found.  ASSESSMENT & PLAN:   45 y.o. African-American female with   1) Chronic ITP since 2012 now with acute relapse of her ITP with very labile platelet counts. No overt bleeding at this time. Patient has been on and off steroids and has been on Promacta since 2016. Was apparently on Promacta '25mg'$  po MWF. No issues with bleeding at this time. No petechiae B12 wnl. Ultrasound abdomen 06/02/2016- did show some mild splenomegaly which could be an additional factor in her thrombocytopenia. The spleen measures 12.9 x 13.9 x 6.7 cm. The calculated volume is 628 cc. Unclear etiology of splenomegaly. Liver appeared normal.  Patient's platelet counts have normalized after treatment with Rituxan weekly 4 doses with platelets improved temporarily but she needed additional treatment.  PLAN:  -Discussed pt labwork today, 02/10/2021; platelets are normal at 160k.  -Iron deficiency anemia is resolved and iron levels are adequate. -Continue 25 mg Promacta daily. The pt has no prohibitive toxicities at this time.  -Continue taking a daily multi-vitamin  -May reduce iron Polysaccharide to 150 mg po daily -Continue avoiding NSAIDS, ASA, and similar products.  2) Iron deficiency Anemia - likely from heavy menstrual losses. -Tolerating the po iron without any significant issues.  Lab Results  Component Value Date   FERRITIN 116 07/07/2020   Plan  -Continue oral iron replacement with iron polysaccharide 150 mg by mouth once daily till ferritin levels are more than 100. -Her periods/menorrhagia remain overall stable.   3)  Hypothyroidism - on levothyroxine As per primary care physician -continue Mx per PCP  4) . Patient Active Problem List   Diagnosis Date Noted   Menorrhagia with irregular cycle 12/04/2020   Body mass index (BMI) of 50.0 to 59.9 in adult (H[facility])    BRBPR (bright red blood per rectum)    Polyp of descending colon    Encntr for gyn exam  (general) (routine) w/o abn findings 10/13/2016   Exposure to STD 10/13/2016   Chronic ITP (idiopathic thrombocytopenia) ([place]) 05/03/2016   Thrombocytopenia ([place]) 04/19/2016  Iron deficiency anemia 04/19/2016   Hypothyroidism 04/19/2016   Degenerative arthritis 04/19/2016   Generalized anxiety disorder 04/19/2016  -continue f/u with PCP  5) Obesity  -discussed lifestyle modifications including need for appropriate diet and exercise. Encouraged her to take at least 10,000 steps a day and have a balanced healthy diet.  6) H/o PE in 2004-- still has IVC filter in situ. Thought to be triggered by immobility and obesity. -not on anticoagulation at this time.    FOLLOW UP: F/u with PCP to evaluation for Diabetes RTC with Dr Irene Limbo with labs in 3 months   The total time spent in the appointment was 20 minutes and more than 50% was on counseling and direct patient cares.  All of the patient's questions were answered with apparent satisfaction. The patient knows to call the clinic with any problems, questions or concerns.   Sullivan Lone MD [place] AAHIVMS [facility] Hematology/Oncology Physician [facility]  (Office):       [phone] (Work cell):  [phone] (Fax):           [phone]  I, Reinaldo Raddle, am acting as scribe for Dr. Sullivan Lone, MD.  .I have reviewed the above documentation for accuracy and completeness, and I agree with the above. Brunetta Genera MD

## 2021-02-10 ENCOUNTER — Inpatient Hospital Stay: Payer: Medicaid Other | Attending: Hematology | Admitting: Hematology

## 2021-02-10 ENCOUNTER — Other Ambulatory Visit: Payer: Self-pay

## 2021-02-10 ENCOUNTER — Inpatient Hospital Stay: Payer: Medicaid Other

## 2021-02-10 VITALS — BP 136/80 | HR 81 | Temp 98.7°F | Resp 18 | Wt 335.9 lb

## 2021-02-10 DIAGNOSIS — Z6841 Body Mass Index (BMI) 40.0 and over, adult: Secondary | ICD-10-CM | POA: Insufficient documentation

## 2021-02-10 DIAGNOSIS — F411 Generalized anxiety disorder: Secondary | ICD-10-CM | POA: Diagnosis not present

## 2021-02-10 DIAGNOSIS — Z8049 Family history of malignant neoplasm of other genital organs: Secondary | ICD-10-CM | POA: Diagnosis not present

## 2021-02-10 DIAGNOSIS — Z86711 Personal history of pulmonary embolism: Secondary | ICD-10-CM | POA: Diagnosis not present

## 2021-02-10 DIAGNOSIS — M1990 Unspecified osteoarthritis, unspecified site: Secondary | ICD-10-CM | POA: Diagnosis not present

## 2021-02-10 DIAGNOSIS — D693 Immune thrombocytopenic purpura: Secondary | ICD-10-CM

## 2021-02-10 DIAGNOSIS — D509 Iron deficiency anemia, unspecified: Secondary | ICD-10-CM | POA: Diagnosis not present

## 2021-02-10 DIAGNOSIS — E669 Obesity, unspecified: Secondary | ICD-10-CM | POA: Insufficient documentation

## 2021-02-10 DIAGNOSIS — G43909 Migraine, unspecified, not intractable, without status migrainosus: Secondary | ICD-10-CM | POA: Insufficient documentation

## 2021-02-10 DIAGNOSIS — Z79890 Hormone replacement therapy (postmenopausal): Secondary | ICD-10-CM | POA: Insufficient documentation

## 2021-02-10 DIAGNOSIS — R161 Splenomegaly, not elsewhere classified: Secondary | ICD-10-CM | POA: Diagnosis not present

## 2021-02-10 DIAGNOSIS — Z560 Unemployment, unspecified: Secondary | ICD-10-CM | POA: Diagnosis not present

## 2021-02-10 DIAGNOSIS — E039 Hypothyroidism, unspecified: Secondary | ICD-10-CM | POA: Diagnosis not present

## 2021-02-10 DIAGNOSIS — Z79899 Other long term (current) drug therapy: Secondary | ICD-10-CM | POA: Insufficient documentation

## 2021-02-10 DIAGNOSIS — N920 Excessive and frequent menstruation with regular cycle: Secondary | ICD-10-CM | POA: Insufficient documentation

## 2021-02-10 DIAGNOSIS — Z833 Family history of diabetes mellitus: Secondary | ICD-10-CM | POA: Insufficient documentation

## 2021-02-10 LAB — CMP (CANCER CENTER ONLY)
ALT: 21 U/L (ref 0–44)
AST: 19 U/L (ref 15–41)
Albumin: 3.5 g/dL (ref 3.5–5.0)
Alkaline Phosphatase: 88 U/L (ref 38–126)
Anion gap: 9 (ref 5–15)
BUN: 7 mg/dL (ref 6–20)
CO2: 26 mmol/L (ref 22–32)
Calcium: 9.3 mg/dL (ref 8.9–10.3)
Chloride: 104 mmol/L (ref 98–111)
Creatinine: 0.71 mg/dL (ref 0.44–1.00)
GFR, Estimated: >60 mL/min (ref >60)
Glucose, Bld: 170 mg/dL — ABNORMAL HIGH (ref 70–99)
Potassium: 4 mmol/L (ref 3.5–5.1)
Sodium: 139 mmol/L (ref 135–145)
Total Bilirubin: 0.5 mg/dL (ref 0.3–1.2)
Total Protein: 7.1 g/dL (ref 6.5–8.1)

## 2021-02-10 LAB — CBC WITH DIFFERENTIAL (CANCER CENTER ONLY)
Abs Immature Granulocytes: 0.03 10*3/uL (ref 0.00–0.07)
Basophils Absolute: 0 10*3/uL (ref 0.0–0.1)
Basophils Relative: 0 %
Eosinophils Absolute: 0.2 10*3/uL (ref 0.0–0.5)
Eosinophils Relative: 2 %
HCT: 37.9 % (ref 36.0–46.0)
Hemoglobin: 12.9 g/dL (ref 12.0–15.0)
Immature Granulocytes: 0 %
Lymphocytes Relative: 18 %
Lymphs Abs: 1.3 10*3/uL (ref 0.7–4.0)
MCH: 26.5 pg (ref 26.0–34.0)
MCHC: 34 g/dL (ref 30.0–36.0)
MCV: 78 fL — ABNORMAL LOW (ref 80.0–100.0)
Monocytes Absolute: 0.5 10*3/uL (ref 0.1–1.0)
Monocytes Relative: 7 %
Neutro Abs: 5 10*3/uL (ref 1.7–7.7)
Neutrophils Relative %: 73 %
Platelet Count: 160 10*3/uL (ref 150–400)
RBC: 4.86 MIL/uL (ref 3.87–5.11)
RDW: 13 % (ref 11.5–15.5)
WBC Count: 6.9 10*3/uL (ref 4.0–10.5)
nRBC: 0 % (ref 0.0–0.2)

## 2021-02-10 LAB — IRON AND TIBC
Iron: 133 ug/dL (ref 41–142)
Saturation Ratios: 35 % (ref 21–57)
TIBC: 380 ug/dL (ref 236–444)
UIBC: 247 ug/dL (ref 120–384)

## 2021-02-10 LAB — FERRITIN: Ferritin: 121 ng/mL (ref 11–307)

## 2021-02-25 ENCOUNTER — Other Ambulatory Visit: Payer: Self-pay | Admitting: Family Medicine

## 2021-02-25 DIAGNOSIS — N63 Unspecified lump in unspecified breast: Secondary | ICD-10-CM

## 2021-03-02 ENCOUNTER — Ambulatory Visit
Admission: RE | Admit: 2021-03-02 | Discharge: 2021-03-02 | Disposition: A | Discharge status: Home / Self Care | Payer: Medicaid Other | Source: Ambulatory Visit | Attending: Family Medicine | Admitting: Family Medicine

## 2021-03-02 ENCOUNTER — Other Ambulatory Visit: Payer: Self-pay

## 2021-03-02 ENCOUNTER — Other Ambulatory Visit: Payer: Self-pay | Admitting: Family Medicine

## 2021-03-02 DIAGNOSIS — N6489 Other specified disorders of breast: Secondary | ICD-10-CM

## 2021-03-02 DIAGNOSIS — N63 Unspecified lump in unspecified breast: Secondary | ICD-10-CM

## 2021-03-03 ENCOUNTER — Other Ambulatory Visit (HOSPITAL_COMMUNITY): Payer: Self-pay

## 2021-03-05 ENCOUNTER — Other Ambulatory Visit (HOSPITAL_COMMUNITY): Payer: Self-pay

## 2021-03-09 ENCOUNTER — Other Ambulatory Visit (HOSPITAL_COMMUNITY): Payer: Self-pay

## 2021-03-10 ENCOUNTER — Other Ambulatory Visit (HOSPITAL_COMMUNITY): Payer: Self-pay

## 2021-03-10 ENCOUNTER — Other Ambulatory Visit: Payer: Self-pay | Admitting: Hematology

## 2021-03-10 DIAGNOSIS — D693 Immune thrombocytopenic purpura: Secondary | ICD-10-CM

## 2021-03-10 MED ORDER — ELTROMBOPAG OLAMINE 25 MG PO TABS
ORAL_TABLET | ORAL | 1 refills | Status: DC
  Filled 2021-03-10: qty 30, 30d supply, fill #0
  Filled 2021-04-02: qty 30, 30d supply, fill #1

## 2021-03-11 ENCOUNTER — Other Ambulatory Visit (HOSPITAL_COMMUNITY): Payer: Self-pay

## 2021-03-12 ENCOUNTER — Other Ambulatory Visit (HOSPITAL_COMMUNITY): Payer: Self-pay

## 2021-03-31 ENCOUNTER — Other Ambulatory Visit (HOSPITAL_COMMUNITY): Payer: Self-pay

## 2021-04-02 ENCOUNTER — Other Ambulatory Visit (HOSPITAL_COMMUNITY): Payer: Self-pay

## 2021-04-08 ENCOUNTER — Other Ambulatory Visit (HOSPITAL_COMMUNITY): Payer: Self-pay

## 2021-04-16 ENCOUNTER — Encounter: Payer: Self-pay | Admitting: Gastroenterology

## 2021-05-05 ENCOUNTER — Other Ambulatory Visit (HOSPITAL_COMMUNITY): Payer: Self-pay

## 2021-05-05 ENCOUNTER — Other Ambulatory Visit: Payer: Self-pay | Admitting: Hematology

## 2021-05-05 DIAGNOSIS — D693 Immune thrombocytopenic purpura: Secondary | ICD-10-CM

## 2021-05-05 MED ORDER — ELTROMBOPAG OLAMINE 25 MG PO TABS
ORAL_TABLET | ORAL | 1 refills | Status: DC
  Filled 2021-05-05: qty 30, 30d supply, fill #0
  Filled 2021-06-09: qty 30, 30d supply, fill #1

## 2021-05-11 ENCOUNTER — Other Ambulatory Visit (HOSPITAL_COMMUNITY): Payer: Self-pay

## 2021-05-13 ENCOUNTER — Inpatient Hospital Stay: Payer: Medicaid Other

## 2021-05-13 ENCOUNTER — Inpatient Hospital Stay: Payer: Medicaid Other | Attending: Hematology | Admitting: Hematology

## 2021-05-19 ENCOUNTER — Telehealth: Payer: Self-pay | Admitting: Hematology

## 2021-05-19 NOTE — Telephone Encounter (Signed)
Scheduled appointment per 10/27 sch msg. Left message with new appointment time and date.

## 2021-05-20 ENCOUNTER — Inpatient Hospital Stay: Payer: Medicaid Other | Attending: Hematology

## 2021-05-20 ENCOUNTER — Inpatient Hospital Stay (HOSPITAL_BASED_OUTPATIENT_CLINIC_OR_DEPARTMENT_OTHER): Payer: Medicaid Other | Admitting: Hematology

## 2021-05-20 ENCOUNTER — Other Ambulatory Visit: Payer: Self-pay

## 2021-05-20 VITALS — BP 128/90 | HR 84 | Temp 97.5°F | Resp 20 | Wt 337.0 lb

## 2021-05-20 DIAGNOSIS — Z8049 Family history of malignant neoplasm of other genital organs: Secondary | ICD-10-CM | POA: Insufficient documentation

## 2021-05-20 DIAGNOSIS — D693 Immune thrombocytopenic purpura: Secondary | ICD-10-CM

## 2021-05-20 DIAGNOSIS — Z79890 Hormone replacement therapy (postmenopausal): Secondary | ICD-10-CM | POA: Insufficient documentation

## 2021-05-20 DIAGNOSIS — F1290 Cannabis use, unspecified, uncomplicated: Secondary | ICD-10-CM | POA: Insufficient documentation

## 2021-05-20 DIAGNOSIS — N920 Excessive and frequent menstruation with regular cycle: Secondary | ICD-10-CM | POA: Diagnosis not present

## 2021-05-20 DIAGNOSIS — D509 Iron deficiency anemia, unspecified: Secondary | ICD-10-CM

## 2021-05-20 DIAGNOSIS — Z833 Family history of diabetes mellitus: Secondary | ICD-10-CM | POA: Insufficient documentation

## 2021-05-20 DIAGNOSIS — E039 Hypothyroidism, unspecified: Secondary | ICD-10-CM | POA: Diagnosis not present

## 2021-05-20 DIAGNOSIS — G43909 Migraine, unspecified, not intractable, without status migrainosus: Secondary | ICD-10-CM | POA: Insufficient documentation

## 2021-05-20 DIAGNOSIS — D500 Iron deficiency anemia secondary to blood loss (chronic): Secondary | ICD-10-CM | POA: Insufficient documentation

## 2021-05-20 DIAGNOSIS — F411 Generalized anxiety disorder: Secondary | ICD-10-CM | POA: Diagnosis not present

## 2021-05-20 DIAGNOSIS — M1990 Unspecified osteoarthritis, unspecified site: Secondary | ICD-10-CM | POA: Diagnosis not present

## 2021-05-20 DIAGNOSIS — Z6841 Body Mass Index (BMI) 40.0 and over, adult: Secondary | ICD-10-CM | POA: Insufficient documentation

## 2021-05-20 DIAGNOSIS — Z560 Unemployment, unspecified: Secondary | ICD-10-CM | POA: Diagnosis not present

## 2021-05-20 DIAGNOSIS — Z86711 Personal history of pulmonary embolism: Secondary | ICD-10-CM | POA: Insufficient documentation

## 2021-05-20 DIAGNOSIS — E669 Obesity, unspecified: Secondary | ICD-10-CM | POA: Diagnosis not present

## 2021-05-20 LAB — CBC WITH DIFFERENTIAL/PLATELET
Abs Immature Granulocytes: 0.04 10*3/uL (ref 0.00–0.07)
Basophils Absolute: 0 10*3/uL (ref 0.0–0.1)
Basophils Relative: 0 %
Eosinophils Absolute: 0.2 10*3/uL (ref 0.0–0.5)
Eosinophils Relative: 2 %
HCT: 39 % (ref 36.0–46.0)
Hemoglobin: 13 g/dL (ref 12.0–15.0)
Immature Granulocytes: 1 %
Lymphocytes Relative: 16 %
Lymphs Abs: 1 10*3/uL (ref 0.7–4.0)
MCH: 26 pg (ref 26.0–34.0)
MCHC: 33.3 g/dL (ref 30.0–36.0)
MCV: 78 fL — ABNORMAL LOW (ref 80.0–100.0)
Monocytes Absolute: 0.5 10*3/uL (ref 0.1–1.0)
Monocytes Relative: 8 %
Neutro Abs: 5 10*3/uL (ref 1.7–7.7)
Neutrophils Relative %: 73 %
Platelets: 200 10*3/uL (ref 150–400)
RBC: 5 MIL/uL (ref 3.87–5.11)
RDW: 13.4 % (ref 11.5–15.5)
WBC: 6.7 10*3/uL (ref 4.0–10.5)
nRBC: 0 % (ref 0.0–0.2)

## 2021-05-20 LAB — FERRITIN: Ferritin: 116 ng/mL (ref 11–307)

## 2021-05-20 LAB — CMP (CANCER CENTER ONLY)
ALT: 23 U/L (ref 0–44)
AST: 23 U/L (ref 15–41)
Albumin: 3.6 g/dL (ref 3.5–5.0)
Alkaline Phosphatase: 103 U/L (ref 38–126)
Anion gap: 11 (ref 5–15)
BUN: 9 mg/dL (ref 6–20)
CO2: 25 mmol/L (ref 22–32)
Calcium: 8.8 mg/dL — ABNORMAL LOW (ref 8.9–10.3)
Chloride: 103 mmol/L (ref 98–111)
Creatinine: 0.69 mg/dL (ref 0.44–1.00)
GFR, Estimated: >60 mL/min (ref >60)
Glucose, Bld: 182 mg/dL — ABNORMAL HIGH (ref 70–99)
Potassium: 3.9 mmol/L (ref 3.5–5.1)
Sodium: 139 mmol/L (ref 135–145)
Total Bilirubin: 0.6 mg/dL (ref 0.3–1.2)
Total Protein: 7.3 g/dL (ref 6.5–8.1)

## 2021-05-26 NOTE — Progress Notes (Signed)
HEMATOLOGY/ONCOLOGY CLINIC NOTE  Date of Service: .05/20/2021   PCP: .Lin Landsman, MD   CHIEF COMPLAINTS/PURPOSE OF CONSULTATION:  F/u for continued management of ITP  HISTORY OF PRESENTING ILLNESS:   Kiara Barrera is a wonderful 45 y.o. 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  Patient was been managed for her chronic ITP by Dr Yvone Neu at [facility] in [place], [place] and recently moved to [place] and wants to establish care here.  As per review of outside records the patient was diagnosed with ITP since 2012 in [place] and has been treated with prednisone on off since then. She apparently had significant weight gain with prednisone and had chronically low platelets despite the prednisone. She was then started on Promacta 50 mg by mouth daily in June 2016 and had a very good response with platelet counts of more than 500k. Her Promacta was discontinued in July 2016. She had relapse of her ITP in September 2016 with platelet count was down to 49k any significant bleeding or bruising. He has been on variable doses of Promacta since then and reports that she is currently taking 25 mg on Monday Wednesday and Friday.  He has had previous iron deficiency due to heavy periods which was treated with oral iron replacement.  INTERVAL HISTORY:  Kiara Barrera is here for evaluation and management of her chronic ITP and iron deficiency anemia. The patient's last visit with [place] was about 3 months ago. The pt reports that she is doing well overall.  Patient notes no acute new issues.  No bleeding.  No new reported toxicities from her Promacta.  Has been compliant with her Promacta use.  Lab results today 05/20/2021 shows CBC with stable platelet counts which are currently within normal limits at 200k.  No anemia.  WBC count within normal limits. On review of  systems, pt reports no other acute new symptoms.   MEDICAL HISTORY:  #1 chronic ITP #2 Iron deficiency anemia due to heavy periods #3 hypothyroidism #4 degenerative arthritis #5 anxiety disorder #6 history of pulmonary embolism in 2004 status post IVC filter placement. 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 #7 migraine headaches  SURGICAL HISTORY: Past Surgical History:  Procedure Laterality Date   BREAST BIOPSY Left    Pt believes she had lymphnode biopsy   C secton  2002   COLONOSCOPY WITH PROPOFOL N/A 02/07/2018   Procedure: COLONOSCOPY WITH PROPOFOL;  Surgeon: Mauri Pole, MD;  [facility]: [facility];  Service: Endoscopy;  Laterality: N/A;   IVC FILTER PLACEMENT ([place] HX)  2004   Left axillary lymph node excision biopsy  2008   POLYPECTOMY  02/07/2018   Procedure: POLYPECTOMY;  Surgeon: Mauri Pole, MD;  [facility];  Service: Endoscopy;;  Clips Placed    SOCIAL HISTORY: Social History   Socioeconomic History   Marital status: Single    Spouse name: Not on file   Number of children: Not on file   Years of education: Not on file   Highest education level: Not on file  Occupational History   Not on file  Tobacco Use   Smoking status: Never   Smokeless tobacco: Never  Vaping Use   Vaping Use: Never used  Substance and Sexual Activity  Alcohol use: Yes    Comment: occasional: not that often   Drug use: Yes    Types: Marijuana   Sexual activity: Yes    Partners: Male    Birth control/protection: None  Other Topics Concern   Not on file  Social History Narrative   Not on file   Social Determinants of Health   Financial Resource Strain: Not on file  Food Insecurity: Not on file  Transportation Needs: Not on file  Physical Activity: Not on file  Stress: Not on file  Social Connections: Not on file  Intimate Partner Violence: Not  on file  Recently moved from [place], [place] to [place]. Nonsmoker minimal social alcohol use no drug use Currently unemployed Has a 71 year old daughter  FAMILY HISTORY: No known history of blood disorders Mother had a history of cervical cancer  maternal grandmother diabetes type 2 Maternal uncle diabetes type 2  ALLERGIES:  is allergic to dextromethorphan and doxylamine succinate (sleep).  MEDICATIONS:  Current Outpatient Medications  Medication Sig Dispense Refill   Cholecalciferol (VITAMIN D3 PO) Take 1 capsule by mouth daily.     Cyanocobalamin (B-12) 1000 MCG SUBL Place 1,000 mcg under the tongue daily. 30 each 3   eltrombopag (PROMACTA) 25 MG tablet TAKE 1 TABLET (25 MG TOTAL) BY MOUTH DAILY. TAKE ON AN EMPTY STOMACH 1 HR BEFORE OR 2 HR AFTER FOOD. SEPARATE FROM IRON SUPPLEMENT. 30 tablet 1   ergocalciferol (VITAMIN D2) 1.25 MG (50000 UT) capsule Vitamin D2 1,250 mcg (50,000 unit) capsule  TK ONE C PO  ONCE A WEEK     hydrochlorothiazide (HYDRODIURIL) 25 MG tablet Take 25 mg by mouth daily.     iron polysaccharides (NIFEREX) 150 MG capsule Take 1 capsule (150 mg total) by mouth 2 (two) times daily.     levothyroxine (SYNTHROID, LEVOTHROID) 100 MCG tablet Take 1 tablet (100 mcg total) by mouth daily before breakfast. 30 tablet 0   Multiple Vitamin (MULTIVITAMIN) tablet Take 1 tablet by mouth daily.     No current facility-administered medications for this visit.    REVIEW OF SYSTEMS:   .10 Point review of Systems was done is negative except as noted above.  PHYSICAL EXAMINATION: ECOG PERFORMANCE STATUS: 1 - Symptomatic but completely ambulatory  Vitals:   05/20/21 0930  BP: 128/90  Pulse: 84  Resp: 20  Temp: (!) 97.5 F (36.4 C)  SpO2: 100%    Filed Weights   05/20/21 0930  Weight: (!) 337 lb (152.9 kg)    .Body mass index is 61.64 kg/m.  [place] Kitchen GENERAL:alert, in no acute distress and comfortable SKIN: no acute rashes, no significant lesions EYES:  conjunctiva are pink and non-injected, sclera anicteric OROPHARYNX: MMM, no exudates, no oropharyngeal erythema or ulceration NECK: supple, no JVD LYMPH:  no palpable lymphadenopathy in the cervical, axillary or inguinal regions LUNGS: clear to auscultation b/l with normal respiratory effort HEART: regular rate & rhythm ABDOMEN:  normoactive bowel sounds , non tender, not distended. Extremity: no pedal edema PSYCH: alert & oriented x 3 with fluent speech NEURO: no focal motor/sensory deficits   LABORATORY DATA:  I have reviewed the data as listed  . CBC Latest Ref Rng & Units 05/20/2021 02/10/2021 11/11/2020  WBC 4.0 - 10.5 K/uL 6.7 6.9 7.1  Hemoglobin 12.0 - 15.0 g/dL 13.0 12.9 12.8  Hematocrit 36.0 - 46.0 % 39.0 37.9 38.3  Platelets 150 - 400 K/uL 200 160 138(L)    CMP Latest Ref Rng & Units 05/20/2021 02/10/2021 11/11/2020  Glucose 70 - 99 mg/dL 182(H) 170(H) 197(H)  BUN 6 - 20 mg/dL 9 7 12   Creatinine 0.44 - 1.00 mg/dL 0.69 0.71 0.76  Sodium 135 - 145 mmol/L 139 139 139  Potassium 3.5 - 5.1 mmol/L 3.9 4.0 3.9  Chloride 98 - 111 mmol/L 103 104 101  CO2 22 - 32 mmol/L 25 26 27   Calcium 8.9 - 10.3 mg/dL 8.8(L) 9.3 9.0  Total Protein 6.5 - 8.1 g/dL 7.3 7.1 7.5  Total Bilirubin 0.3 - 1.2 mg/dL 0.6 0.5 0.5  Alkaline Phos 38 - 126 U/L 103 88 87  AST 15 - 41 U/L 23 19 20   ALT 0 - 44 U/L 23 21 22    . Lab Results  Component Value Date   IRON 133 02/10/2021   TIBC 380 02/10/2021   IRONPCTSAT 35 02/10/2021   (Iron and TIBC)  Lab Results  Component Value Date   FERRITIN 116 05/20/2021    RADIOGRAPHIC STUDIES: I have personally reviewed the radiological images as listed and agreed with the findings in the report. No results found.  ASSESSMENT & PLAN:   45 y.o. African-American female with   1) Chronic ITP since 2012 now with acute relapse of her ITP with very labile platelet counts. No overt bleeding at this time. Patient has been on and off steroids and has been on  Promacta since 2016. Was apparently on Promacta 25mg  po MWF. No issues with bleeding at this time. No petechiae B12 wnl. Ultrasound abdomen 06/02/2016- did show some mild splenomegaly which could be an additional factor in her thrombocytopenia. The spleen measures 12.9 x 13.9 x 6.7 cm. The calculated volume is 628 cc. Unclear etiology of splenomegaly. Liver appeared normal.  Patient's platelet counts have normalized after treatment with Rituxan weekly 4 doses with platelets improved temporarily but she needed additional treatment.  PLAN:  -Discussed pt labwork today, 05/20/2021; platelets are normal at 200k.  -Continue 25 mg Promacta daily. The pt has no prohibitive toxicities at this time.  Discuss that if her platelets keep rising or persistent remain >150k could consider reducing dose of Promacta -Continue taking a daily multi-vitamin  -continued iron Polysaccharide to 150 mg po daily -Continue avoiding NSAIDS, ASA, and similar products.  2) Iron deficiency Anemia - likely from heavy menstrual losses. -Tolerating the po iron without any significant issues.  Lab Results  Component Value Date   FERRITIN 116 05/20/2021   Plan  -Continue oral iron replacement with iron polysaccharide 150 mg by mouth once daily till ferritin levels are more than 100. -Her periods/menorrhagia remain overall stable.   3)  Hypothyroidism - on levothyroxine As per primary care physician -continue Mx per PCP  4) . Patient Active Problem List   Diagnosis Date Noted   Menorrhagia with irregular cycle 12/04/2020   Body mass index (BMI) of 50.0 to 59.9 in adult ([place])    BRBPR (bright red blood per rectum)    Polyp of descending colon    Encntr for gyn exam (general) (routine) w/o abn findings 10/13/2016   Exposure to STD 10/13/2016   Chronic ITP (idiopathic thrombocytopenia) ([place]) 05/03/2016   Thrombocytopenia ([place]) 04/19/2016   Iron deficiency anemia 04/19/2016   Hypothyroidism 04/19/2016    Degenerative arthritis 04/19/2016   Generalized anxiety disorder 04/19/2016  -continue f/u with PCP  5) Obesity  -discussed lifestyle modifications including need for appropriate diet and exercise. Encouraged her to take at least 10,000 steps a day and have a balanced healthy diet.  6) H/o PE  in 2004-- still has IVC filter in situ. Thought to be triggered by immobility and obesity. -not on anticoagulation at this time.    FOLLOW UP: Phone visit with Dr. Irene Limbo in 3 months Labs 1 day prior to phone visit  . The total time spent in the appointment was 20 minutes and more than 50% was on counseling and direct patient cares.   All of the patient's questions were answered with apparent satisfaction. The patient knows to call the clinic with any problems, questions or concerns.   Sullivan Lone MD [place] AAHIVMS [facility] Hematology/Oncology Physician [facility]

## 2021-06-02 ENCOUNTER — Ambulatory Visit: Payer: Medicaid Other | Admitting: Hematology

## 2021-06-02 ENCOUNTER — Other Ambulatory Visit: Payer: Medicaid Other

## 2021-06-04 ENCOUNTER — Other Ambulatory Visit (HOSPITAL_COMMUNITY): Payer: Self-pay

## 2021-06-09 ENCOUNTER — Other Ambulatory Visit (HOSPITAL_COMMUNITY): Payer: Self-pay

## 2021-06-10 ENCOUNTER — Other Ambulatory Visit (HOSPITAL_COMMUNITY): Payer: Self-pay

## 2021-07-02 ENCOUNTER — Other Ambulatory Visit (HOSPITAL_COMMUNITY): Payer: Self-pay

## 2021-07-02 ENCOUNTER — Other Ambulatory Visit: Payer: Self-pay | Admitting: Hematology

## 2021-07-02 DIAGNOSIS — D693 Immune thrombocytopenic purpura: Secondary | ICD-10-CM

## 2021-07-03 ENCOUNTER — Other Ambulatory Visit (HOSPITAL_COMMUNITY): Payer: Self-pay

## 2021-07-03 MED ORDER — ELTROMBOPAG OLAMINE 25 MG PO TABS
ORAL_TABLET | ORAL | 1 refills | Status: DC
  Filled 2021-07-03: qty 30, 30d supply, fill #0
  Filled 2021-08-06: qty 30, 30d supply, fill #1

## 2021-07-07 ENCOUNTER — Other Ambulatory Visit (HOSPITAL_COMMUNITY): Payer: Self-pay

## 2021-08-04 ENCOUNTER — Other Ambulatory Visit (HOSPITAL_COMMUNITY): Payer: Self-pay

## 2021-08-06 ENCOUNTER — Other Ambulatory Visit (HOSPITAL_COMMUNITY): Payer: Self-pay

## 2021-08-13 ENCOUNTER — Other Ambulatory Visit (HOSPITAL_COMMUNITY): Payer: Self-pay

## 2021-08-19 ENCOUNTER — Inpatient Hospital Stay: Payer: Medicaid Other | Attending: Hematology

## 2021-08-19 ENCOUNTER — Other Ambulatory Visit: Payer: Self-pay

## 2021-08-19 DIAGNOSIS — Z79899 Other long term (current) drug therapy: Secondary | ICD-10-CM | POA: Diagnosis not present

## 2021-08-19 DIAGNOSIS — E039 Hypothyroidism, unspecified: Secondary | ICD-10-CM | POA: Diagnosis not present

## 2021-08-19 DIAGNOSIS — F419 Anxiety disorder, unspecified: Secondary | ICD-10-CM | POA: Insufficient documentation

## 2021-08-19 DIAGNOSIS — D500 Iron deficiency anemia secondary to blood loss (chronic): Secondary | ICD-10-CM | POA: Diagnosis not present

## 2021-08-19 DIAGNOSIS — N920 Excessive and frequent menstruation with regular cycle: Secondary | ICD-10-CM | POA: Insufficient documentation

## 2021-08-19 DIAGNOSIS — M1990 Unspecified osteoarthritis, unspecified site: Secondary | ICD-10-CM | POA: Diagnosis not present

## 2021-08-19 DIAGNOSIS — D693 Immune thrombocytopenic purpura: Secondary | ICD-10-CM | POA: Diagnosis not present

## 2021-08-19 LAB — CMP (CANCER CENTER ONLY)
ALT: 17 U/L (ref 0–44)
AST: 18 U/L (ref 15–41)
Albumin: 3.7 g/dL (ref 3.5–5.0)
Alkaline Phosphatase: 86 U/L (ref 38–126)
Anion gap: 7 (ref 5–15)
BUN: 7 mg/dL (ref 6–20)
CO2: 27 mmol/L (ref 22–32)
Calcium: 8.8 mg/dL — ABNORMAL LOW (ref 8.9–10.3)
Chloride: 105 mmol/L (ref 98–111)
Creatinine: 0.6 mg/dL (ref 0.44–1.00)
GFR, Estimated: >60 mL/min (ref >60)
Glucose, Bld: 177 mg/dL — ABNORMAL HIGH (ref 70–99)
Potassium: 3.9 mmol/L (ref 3.5–5.1)
Sodium: 139 mmol/L (ref 135–145)
Total Bilirubin: 0.6 mg/dL (ref 0.3–1.2)
Total Protein: 7 g/dL (ref 6.5–8.1)

## 2021-08-19 LAB — CBC WITH DIFFERENTIAL (CANCER CENTER ONLY)
Abs Immature Granulocytes: 0.18 10*3/uL — ABNORMAL HIGH (ref 0.00–0.07)
Basophils Absolute: 0 10*3/uL (ref 0.0–0.1)
Basophils Relative: 0 %
Eosinophils Absolute: 0.2 10*3/uL (ref 0.0–0.5)
Eosinophils Relative: 2 %
HCT: 36.6 % (ref 36.0–46.0)
Hemoglobin: 12.4 g/dL (ref 12.0–15.0)
Immature Granulocytes: 2 %
Lymphocytes Relative: 13 %
Lymphs Abs: 1 10*3/uL (ref 0.7–4.0)
MCH: 26.6 pg (ref 26.0–34.0)
MCHC: 33.9 g/dL (ref 30.0–36.0)
MCV: 78.4 fL — ABNORMAL LOW (ref 80.0–100.0)
Monocytes Absolute: 0.6 10*3/uL (ref 0.1–1.0)
Monocytes Relative: 7 %
Neutro Abs: 6 10*3/uL (ref 1.7–7.7)
Neutrophils Relative %: 76 %
Platelet Count: 212 10*3/uL (ref 150–400)
RBC: 4.67 MIL/uL (ref 3.87–5.11)
RDW: 13.3 % (ref 11.5–15.5)
WBC Count: 8 10*3/uL (ref 4.0–10.5)
nRBC: 0 % (ref 0.0–0.2)

## 2021-08-19 LAB — FERRITIN: Ferritin: 105 ng/mL (ref 11–307)

## 2021-08-20 ENCOUNTER — Inpatient Hospital Stay (HOSPITAL_BASED_OUTPATIENT_CLINIC_OR_DEPARTMENT_OTHER): Payer: Medicaid Other | Admitting: Hematology

## 2021-08-20 DIAGNOSIS — D509 Iron deficiency anemia, unspecified: Secondary | ICD-10-CM | POA: Diagnosis not present

## 2021-08-20 DIAGNOSIS — D693 Immune thrombocytopenic purpura: Secondary | ICD-10-CM

## 2021-08-21 ENCOUNTER — Telehealth: Payer: Self-pay | Admitting: Hematology

## 2021-08-21 NOTE — Telephone Encounter (Signed)
Scheduled follow-up appointments per 2/2 los. Patient is aware. °

## 2021-08-26 NOTE — Progress Notes (Addendum)
HEMATOLOGY/ONCOLOGY PHONE VISIT NOTE  Date of Service: .08/20/2021  PCP: .Lin Landsman, MD  CHIEF COMPLAINTS/PURPOSE OF CONSULTATION:  Follow-up for continued management of chronic ITP  HISTORY OF PRESENTING ILLNESS:   Please see previous note for details of initial presentation  INTERVAL HISTORY: .I connected with [place] on  08/20/2021 at  2:00 PM EST by telephone visit and verified that I am speaking with the correct person using two identifiers.   I discussed the limitations, risks, security and privacy concerns of performing an evaluation and management service by telemedicine and the availability of in-person appointments. I also discussed with the patient that there may be a patient responsible charge related to this service. The patient expressed understanding and agreed to proceed.   Other persons participating in the visit and their role in the encounter: none   Patients location: home  Providers location: [facility]   Chief Complaint: Follow-up for chronic ITP and discussion of lab results  Patient notes no acute new concerns since her last clinic visit.  No issues with bleeding or abnormal bruising.  Her periods have been regular. No toxicities from her current dose of Promacta.  Labs done yesterday were reviewed in detail and are stable.    MEDICAL HISTORY:  #1 chronic ITP #2 Iron deficiency anemia due to heavy periods #3 hypothyroidism #4 degenerative arthritis #5 anxiety disorder #6 history of pulmonary embolism in 2004 status post IVC filter placement. 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 #7 migraine headaches  SURGICAL HISTORY: Past Surgical History:  Procedure Laterality Date   BREAST BIOPSY Left    Pt believes she had lymphnode biopsy   C secton  2002   COLONOSCOPY WITH PROPOFOL N/A 02/07/2018   Procedure: COLONOSCOPY WITH  PROPOFOL;  Surgeon: Mauri Pole, MD;  Location: [facility];  Service: Endoscopy;  Laterality: N/A;   IVC FILTER PLACEMENT ([place] HX)  2004   Left axillary lymph node excision biopsy  2008   POLYPECTOMY  02/07/2018   Procedure: POLYPECTOMY;  Surgeon: Mauri Pole, MD;  Location: [facility];  Service: Endoscopy;;  Clips Placed    SOCIAL HISTORY: Social History   Socioeconomic History   Marital status: Single    Spouse name: Not on file   Number of children: Not on file   Years of education: Not on file   Highest education level: Not on file  Occupational History   Not on file  Tobacco Use   Smoking status: Never   Smokeless tobacco: Never  Vaping Use   Vaping Use: Never used  Substance and Sexual Activity   Alcohol use: Yes    Comment: occasional: not that often   Drug use: Yes    Types: Marijuana   Sexual activity: Yes    Partners: Male    Birth control/protection: None  Other Topics Concern   Not on file  Social History Narrative   Not on file   Social Determinants of Health   Financial Resource Strain: Not on file  Food Insecurity: Not on file  Transportation Needs: Not on file  Physical Activity: Not on file  Stress: Not on file  Social Connections: Not on file  Intimate Partner Violence: Not on file  Recently moved from [place], [place] to [place]. Nonsmoker minimal social alcohol use no drug use Currently unemployed Has a 57 year old daughter  FAMILY HISTORY: No known  history of blood disorders Mother had a history of cervical cancer  maternal grandmother diabetes type 2 Maternal uncle diabetes type 2  ALLERGIES:  is allergic to dextromethorphan and doxylamine succinate (sleep).  MEDICATIONS:  Current Outpatient Medications  Medication Sig Dispense Refill   Cholecalciferol (VITAMIN D3 PO) Take 1 capsule by mouth daily.     Cyanocobalamin (B-12) 1000 MCG SUBL Place 1,000 mcg under the tongue daily. 30 each 3   eltrombopag  (PROMACTA) 25 MG tablet TAKE 1 TABLET (25 MG TOTAL) BY MOUTH DAILY. TAKE ON AN EMPTY STOMACH 1 HR BEFORE OR 2 HR AFTER FOOD. SEPARATE FROM IRON SUPPLEMENT. 30 tablet 1   ergocalciferol (VITAMIN D2) 1.25 MG (50000 UT) capsule Vitamin D2 1,250 mcg (50,000 unit) capsule  TK ONE C PO  ONCE A WEEK     hydrochlorothiazide (HYDRODIURIL) 25 MG tablet Take 25 mg by mouth daily.     iron polysaccharides (NIFEREX) 150 MG capsule Take 1 capsule (150 mg total) by mouth 2 (two) times daily.     levothyroxine (SYNTHROID, LEVOTHROID) 100 MCG tablet Take 1 tablet (100 mcg total) by mouth daily before breakfast. 30 tablet 0   Multiple Vitamin (MULTIVITAMIN) tablet Take 1 tablet by mouth daily.     No current facility-administered medications for this visit.    REVIEW OF SYSTEMS:   No issues with abnormal bleeding or bruising  PHYSICAL EXAMINATION: Telemedicine visit LABORATORY DATA:  I have reviewed the data as listed  . CBC Latest Ref Rng & Units 08/19/2021 05/20/2021 02/10/2021  WBC 4.0 - 10.5 K/uL 8.0 6.7 6.9  Hemoglobin 12.0 - 15.0 g/dL 12.4 13.0 12.9  Hematocrit 36.0 - 46.0 % 36.6 39.0 37.9  Platelets 150 - 400 K/uL 212 200 160    CMP Latest Ref Rng & Units 08/19/2021 05/20/2021 02/10/2021  Glucose 70 - 99 mg/dL 177(H) 182(H) 170(H)  BUN 6 - 20 mg/dL 7 9 7   Creatinine 0.44 - 1.00 mg/dL 0.60 0.69 0.71  Sodium 135 - 145 mmol/L 139 139 139  Potassium 3.5 - 5.1 mmol/L 3.9 3.9 4.0  Chloride 98 - 111 mmol/L 105 103 104  CO2 22 - 32 mmol/L 27 25 26   Calcium 8.9 - 10.3 mg/dL 8.8(L) 8.8(L) 9.3  Total Protein 6.5 - 8.1 g/dL 7.0 7.3 7.1  Total Bilirubin 0.3 - 1.2 mg/dL 0.6 0.6 0.5  Alkaline Phos 38 - 126 U/L 86 103 88  AST 15 - 41 U/L 18 23 19   ALT 0 - 44 U/L 17 23 21    . Lab Results  Component Value Date   IRON 133 02/10/2021   TIBC 380 02/10/2021   IRONPCTSAT 35 02/10/2021   (Iron and TIBC)  Lab Results  Component Value Date   FERRITIN 105 08/19/2021    RADIOGRAPHIC STUDIES: I have  personally reviewed the radiological images as listed and agreed with the findings in the report. No results found.  ASSESSMENT & PLAN:   46 y.o. African-American female with   1) Chronic ITP since 2012 now with acute relapse of her ITP with very labile platelet counts. No overt bleeding at this time. Patient has been on and off steroids and has been on Promacta since 2016. Was apparently on Promacta 25mg  po MWF. No issues with bleeding at this time. No petechiae B12 wnl. Ultrasound abdomen 06/02/2016- did show some mild splenomegaly which could be an additional factor in her thrombocytopenia. The spleen measures 12.9 x 13.9 x 6.7 cm. The calculated volume is 628 cc. Unclear etiology  of splenomegaly. Liver appeared normal.  Patient's platelet counts have normalized after treatment with Rituxan weekly 4 doses with platelets improved temporarily but she needed additional treatment.  PLAN:  -Patient's labs from today were discussed hemoglobin is normal at 12.4, WBC count of 8k and normal platelets of 212k -No bleeding or bruising issues -Tolerating Promacta at 25 mg p.o. daily.  We shall continue this  2) Iron deficiency Anemia - likely from heavy menstrual losses. -Tolerating the po iron without any significant issues.  Lab Results  Component Value Date   FERRITIN 105 08/19/2021   Plan  -Continue iron polysaccharide 150 mg p.o. daily  3)  Hypothyroidism - on levothyroxine As per primary care physician -continue Mx per PCP  4) . Patient Active Problem List   Diagnosis Date Noted   Menorrhagia with irregular cycle 12/04/2020   Body mass index (BMI) of 50.0 to 59.9 in adult ([place])    BRBPR (bright red blood per rectum)    Polyp of descending colon    Encntr for gyn exam (general) (routine) w/o abn findings 10/13/2016   Exposure to STD 10/13/2016   Chronic ITP (idiopathic thrombocytopenia) ([place]) 05/03/2016   Thrombocytopenia ([place]) 04/19/2016   Iron deficiency anemia 04/19/2016    Hypothyroidism 04/19/2016   Degenerative arthritis 04/19/2016   Generalized anxiety disorder 04/19/2016  -continue f/u with PCP  5) Obesity  -discussed lifestyle modifications including need for appropriate diet and exercise. Encouraged her to take at least 10,000 steps a day and have a balanced healthy diet.  6) H/o PE in 2004-- still has IVC filter in situ. Thought to be triggered by immobility and obesity. -not on anticoagulation at this time.    FOLLOW UP: Phone visit with Dr. Irene Limbo in 3 months Labs 1 day prior to phone visit  All of the patient's questions were answered with apparent satisfaction. The patient knows to call the clinic with any problems, questions or concerns.   Sullivan Lone MD [place] AAHIVMS [facility] Hematology/Oncology Physician [facility]

## 2021-09-03 ENCOUNTER — Other Ambulatory Visit (HOSPITAL_COMMUNITY): Payer: Self-pay

## 2021-09-03 ENCOUNTER — Other Ambulatory Visit: Payer: Self-pay | Admitting: Hematology

## 2021-09-03 ENCOUNTER — Ambulatory Visit
Admission: RE | Admit: 2021-09-03 | Discharge: 2021-09-03 | Disposition: A | Discharge status: Home / Self Care | Payer: Medicaid Other | Source: Ambulatory Visit | Attending: Family Medicine | Admitting: Family Medicine

## 2021-09-03 DIAGNOSIS — D693 Immune thrombocytopenic purpura: Secondary | ICD-10-CM

## 2021-09-03 DIAGNOSIS — N6489 Other specified disorders of breast: Secondary | ICD-10-CM

## 2021-09-03 MED ORDER — ELTROMBOPAG OLAMINE 25 MG PO TABS
ORAL_TABLET | ORAL | 1 refills | Status: DC
  Filled 2021-09-10: qty 30, 30d supply, fill #0
  Filled 2021-10-07: qty 30, 30d supply, fill #1

## 2021-09-10 ENCOUNTER — Other Ambulatory Visit (HOSPITAL_COMMUNITY): Payer: Self-pay

## 2021-10-01 ENCOUNTER — Other Ambulatory Visit (HOSPITAL_COMMUNITY): Payer: Self-pay

## 2021-10-05 ENCOUNTER — Other Ambulatory Visit (HOSPITAL_COMMUNITY): Payer: Self-pay

## 2021-10-07 ENCOUNTER — Other Ambulatory Visit (HOSPITAL_COMMUNITY): Payer: Self-pay

## 2021-10-08 ENCOUNTER — Encounter: Payer: Self-pay | Admitting: Gastroenterology

## 2021-10-09 ENCOUNTER — Telehealth: Payer: Self-pay

## 2021-10-09 NOTE — Telephone Encounter (Signed)
Dr. Silverio Decamp-  ?This patient's BMI as of 11/22 is 60.71. ?Would you like this patient to have an OV or a direct at [facility] as she is a recall? ?Please advise ? ?PV and procedure appts cancelled;  ?

## 2021-10-12 ENCOUNTER — Other Ambulatory Visit (HOSPITAL_COMMUNITY): Payer: Self-pay

## 2021-10-12 NOTE — Telephone Encounter (Signed)
Please schedule as direct procedure at [place] next available appointment for h/o advanced adenomatous colon polyps. Thanks ?

## 2021-10-12 NOTE — Telephone Encounter (Signed)
Patient was called to inform her that Eustaquio Maize would be calling in order to get her scheduled for her procedure at [facility]. Message informed the patient that her PV and colonoscopy at [facility] was cancelled.  ?

## 2021-10-13 ENCOUNTER — Other Ambulatory Visit: Payer: Self-pay

## 2021-10-13 DIAGNOSIS — K635 Polyp of colon: Secondary | ICD-10-CM

## 2021-10-13 NOTE — Telephone Encounter (Signed)
Scheduled her hospital case for May 8 at 10:30 am. Rescheduled the pre-visit to the same date she had originally planned for 10/19/21 at 3:00 pm.  ?Called to inform patient. No answer. Left her a detailed message with my name and phone number on her voicemail.  ?New recall letter mailed to the patient. ?

## 2021-10-13 NOTE — Telephone Encounter (Signed)
Pre visit chart together and ready for pre visit appt ?

## 2021-10-19 ENCOUNTER — Ambulatory Visit (AMBULATORY_SURGERY_CENTER): Payer: Medicaid Other

## 2021-10-19 VITALS — Ht 62.0 in | Wt 334.0 lb

## 2021-10-19 DIAGNOSIS — Z86010 Personal history of colonic polyps: Secondary | ICD-10-CM

## 2021-10-19 MED ORDER — ONDANSETRON HCL 4 MG PO TABS: 4.0000 mg | ORAL_TABLET | ORAL | 0 refills | Status: DC

## 2021-10-19 MED ORDER — PLENVU 140 G PO SOLR: 1.0000 | Freq: Once | ORAL | 0 refills | Status: AC

## 2021-10-19 NOTE — Progress Notes (Signed)
No egg or soy allergy known to patient  ?No issues known to pt with past sedation with any surgeries or procedures ?Patient denies ever being told they had issues or difficulty with intubation  ?No FH of Malignant Hyperthermia ?Pt is not on diet pills ?Pt is not on  home 02  ?Pt is not on blood thinners  ?Pt denies issues with constipation  ?No A fib or A flutter ? ? NO PA's for preps discussed with pt In PV today  ?Discussed with pt there will be an out-of-pocket cost for prep and that varies from $0 to 70 +  dollars - pt verbalized understanding  ? ?Due to the COVID-19 pandemic we are asking patients to follow certain guidelines in PV and the [place]   ?Pt aware of COVID protocols and [facility] guidelines  ? ?PV completed over the phone. Pt verified name, DOB, address and insurance during PV today.  ?Pt mailed instruction packet with copy of consent form to read and not return, and instructions.  ?Pt encouraged to call with questions or issues.  ?If pt has My chart, procedure instructions also sent via My Chart  ? ?

## 2021-11-03 ENCOUNTER — Other Ambulatory Visit (HOSPITAL_COMMUNITY): Payer: Self-pay

## 2021-11-04 ENCOUNTER — Other Ambulatory Visit (HOSPITAL_COMMUNITY): Payer: Self-pay

## 2021-11-04 ENCOUNTER — Other Ambulatory Visit: Payer: Self-pay | Admitting: Hematology

## 2021-11-04 DIAGNOSIS — D693 Immune thrombocytopenic purpura: Secondary | ICD-10-CM

## 2021-11-04 MED ORDER — ELTROMBOPAG OLAMINE 25 MG PO TABS
ORAL_TABLET | ORAL | 1 refills | Status: DC
  Filled 2021-11-04: qty 30, 30d supply, fill #0
  Filled 2021-12-04: qty 30, 30d supply, fill #1

## 2021-11-05 ENCOUNTER — Telehealth: Payer: Self-pay | Admitting: Hematology

## 2021-11-05 NOTE — Telephone Encounter (Signed)
Per provider reschedule called and left pt a message about appointment change call back number left if changes are needed ?

## 2021-11-11 ENCOUNTER — Other Ambulatory Visit (HOSPITAL_COMMUNITY): Payer: Self-pay

## 2021-11-17 ENCOUNTER — Encounter (HOSPITAL_COMMUNITY): Payer: Self-pay | Admitting: Gastroenterology

## 2021-11-17 NOTE — Progress Notes (Signed)
Attempted to obtain medical history via telephone, unable to reach at this time. I left a voicemail to return pre surgical testing department's phone call.  

## 2021-11-20 ENCOUNTER — Telehealth: Payer: Self-pay | Admitting: Gastroenterology

## 2021-11-20 NOTE — Telephone Encounter (Signed)
Doctor notified. Procedure canceled. ?

## 2021-11-20 NOTE — Telephone Encounter (Signed)
Inbound call from patient stating that she needed to cancel procedure on 5/8 at 10:30 at [facility] with no reason given.  ? ? ?Patient stated that she would call back at a later time to reschedule.  ?

## 2021-11-23 ENCOUNTER — Encounter (HOSPITAL_COMMUNITY): Admission: RE | Payer: Self-pay | Source: Home / Self Care

## 2021-11-23 ENCOUNTER — Ambulatory Visit (HOSPITAL_COMMUNITY): Admission: RE | Admit: 2021-11-23 | Payer: Medicaid Other | Source: Home / Self Care | Admitting: Gastroenterology

## 2021-11-23 SURGERY — COLONOSCOPY WITH PROPOFOL
Anesthesia: Monitor Anesthesia Care

## 2021-11-24 ENCOUNTER — Encounter: Payer: Medicaid Other | Admitting: Gastroenterology

## 2021-12-02 ENCOUNTER — Telehealth: Payer: Self-pay

## 2021-12-02 NOTE — Telephone Encounter (Signed)
It is not clear how patient got scheduled for procedure at [facility] and was not flagged by previsit given her last procedure was done at [facility] and her BMI is 59.  Kiara Barrera,  she will need to be scheduled for procedure at [facility] next available appointment.  Thank you ?

## 2021-12-02 NOTE — Telephone Encounter (Signed)
Patient is advised and expresses understanding. She agrees to wait for my call with an appointment for the colonoscopy to be done at the hospital. She is advised that it will be at least a month out. ?

## 2021-12-02 NOTE — Telephone Encounter (Signed)
Pt scheduled at [facility] with Dr. Silverio Decamp on 12/04/21 at [place] to contact pt to cancel upcoming procedure at [facility]. Pt has a current BMI of over 50 and does not meet [facility] guidelines to have procedure at [facility]. Spoke with Beth, RN, and she agreed to contact patient to schedule procedure to be done at the hospital.  ?

## 2021-12-02 NOTE — Telephone Encounter (Signed)
Called the patient to discuss. No answer. I left a message advising  ?1) do not prep  ?2) she cannot have the colonoscopy on Friday 12/04/21  and  ?3) call and ask for Beth to discuss. ?

## 2021-12-02 NOTE — Telephone Encounter (Signed)
Patient returned your call.  Please call back.  Thank you. 

## 2021-12-04 ENCOUNTER — Other Ambulatory Visit (HOSPITAL_COMMUNITY): Payer: Self-pay

## 2021-12-04 ENCOUNTER — Encounter: Payer: Medicaid Other | Admitting: Gastroenterology

## 2021-12-07 ENCOUNTER — Other Ambulatory Visit: Payer: Self-pay

## 2021-12-07 ENCOUNTER — Inpatient Hospital Stay: Payer: Medicaid Other | Attending: Hematology

## 2021-12-07 DIAGNOSIS — Z833 Family history of diabetes mellitus: Secondary | ICD-10-CM | POA: Diagnosis not present

## 2021-12-07 DIAGNOSIS — D500 Iron deficiency anemia secondary to blood loss (chronic): Secondary | ICD-10-CM | POA: Diagnosis not present

## 2021-12-07 DIAGNOSIS — Z79899 Other long term (current) drug therapy: Secondary | ICD-10-CM | POA: Insufficient documentation

## 2021-12-07 DIAGNOSIS — F419 Anxiety disorder, unspecified: Secondary | ICD-10-CM | POA: Diagnosis not present

## 2021-12-07 DIAGNOSIS — Z8049 Family history of malignant neoplasm of other genital organs: Secondary | ICD-10-CM | POA: Diagnosis not present

## 2021-12-07 DIAGNOSIS — E039 Hypothyroidism, unspecified: Secondary | ICD-10-CM | POA: Diagnosis not present

## 2021-12-07 DIAGNOSIS — M1990 Unspecified osteoarthritis, unspecified site: Secondary | ICD-10-CM | POA: Diagnosis not present

## 2021-12-07 DIAGNOSIS — Z86711 Personal history of pulmonary embolism: Secondary | ICD-10-CM | POA: Diagnosis not present

## 2021-12-07 DIAGNOSIS — N920 Excessive and frequent menstruation with regular cycle: Secondary | ICD-10-CM | POA: Diagnosis not present

## 2021-12-07 DIAGNOSIS — D693 Immune thrombocytopenic purpura: Secondary | ICD-10-CM | POA: Diagnosis present

## 2021-12-07 LAB — CBC WITH DIFFERENTIAL/PLATELET
Abs Immature Granulocytes: 0.12 10*3/uL — ABNORMAL HIGH (ref 0.00–0.07)
Basophils Absolute: 0 10*3/uL (ref 0.0–0.1)
Basophils Relative: 0 %
Eosinophils Absolute: 0.6 10*3/uL — ABNORMAL HIGH (ref 0.0–0.5)
Eosinophils Relative: 6 %
HCT: 39.3 % (ref 36.0–46.0)
Hemoglobin: 13.4 g/dL (ref 12.0–15.0)
Immature Granulocytes: 1 %
Lymphocytes Relative: 13 %
Lymphs Abs: 1.2 10*3/uL (ref 0.7–4.0)
MCH: 26.5 pg (ref 26.0–34.0)
MCHC: 34.1 g/dL (ref 30.0–36.0)
MCV: 77.7 fL — ABNORMAL LOW (ref 80.0–100.0)
Monocytes Absolute: 0.7 10*3/uL (ref 0.1–1.0)
Monocytes Relative: 7 %
Neutro Abs: 6.8 10*3/uL (ref 1.7–7.7)
Neutrophils Relative %: 73 %
Platelets: 167 10*3/uL (ref 150–400)
RBC: 5.06 MIL/uL (ref 3.87–5.11)
RDW: 13.9 % (ref 11.5–15.5)
WBC: 9.5 10*3/uL (ref 4.0–10.5)
nRBC: 0 % (ref 0.0–0.2)

## 2021-12-07 LAB — CMP (CANCER CENTER ONLY)
ALT: 19 U/L (ref 0–44)
AST: 16 U/L (ref 15–41)
Albumin: 3.9 g/dL (ref 3.5–5.0)
Alkaline Phosphatase: 86 U/L (ref 38–126)
Anion gap: 8 (ref 5–15)
BUN: 12 mg/dL (ref 6–20)
CO2: 26 mmol/L (ref 22–32)
Calcium: 8.6 mg/dL — ABNORMAL LOW (ref 8.9–10.3)
Chloride: 103 mmol/L (ref 98–111)
Creatinine: 0.75 mg/dL (ref 0.44–1.00)
GFR, Estimated: >60 mL/min (ref >60)
Glucose, Bld: 266 mg/dL — ABNORMAL HIGH (ref 70–99)
Potassium: 4.3 mmol/L (ref 3.5–5.1)
Sodium: 137 mmol/L (ref 135–145)
Total Bilirubin: 0.6 mg/dL (ref 0.3–1.2)
Total Protein: 7.3 g/dL (ref 6.5–8.1)

## 2021-12-07 LAB — IMMATURE PLATELET FRACTION: Immature Platelet Fraction: 7.7 % (ref 1.2–8.6)

## 2021-12-08 ENCOUNTER — Ambulatory Visit: Payer: Medicaid Other | Admitting: Hematology

## 2021-12-08 ENCOUNTER — Inpatient Hospital Stay (HOSPITAL_BASED_OUTPATIENT_CLINIC_OR_DEPARTMENT_OTHER): Payer: Medicaid Other | Admitting: Hematology

## 2021-12-08 DIAGNOSIS — D509 Iron deficiency anemia, unspecified: Secondary | ICD-10-CM

## 2021-12-08 DIAGNOSIS — D693 Immune thrombocytopenic purpura: Secondary | ICD-10-CM | POA: Diagnosis not present

## 2021-12-10 ENCOUNTER — Other Ambulatory Visit (HOSPITAL_COMMUNITY): Payer: Self-pay

## 2021-12-14 NOTE — Progress Notes (Signed)
HEMATOLOGY/ONCOLOGY PHONE VISIT NOTE  Date of Service: .12/08/2021  PCP: .Lin Landsman, MD  CHIEF COMPLAINTS/PURPOSE OF CONSULTATION:  Follow-up for management of chronic ITP  HISTORY OF PRESENTING ILLNESS:   Please see previous note for details of initial presentation  INTERVAL HISTORY:  .I connected with Kiara Barrera on  12/08/2021 at  8:40 AM EDT by telephone visit and verified that I am speaking with the correct person using two identifiers.   I discussed the limitations, risks, security and privacy concerns of performing an evaluation and management service by telemedicine and the availability of in-person appointments. I also discussed with the patient that there may be a patient responsible charge related to this service. The patient expressed understanding and agreed to proceed.   Other persons participating in the visit and their role in the encounter: none   Patient's location: home  Provider's location: [facility]   Chief Complaint: Follow-up for chronic ITP and discussion of lab results  Patient notes no acute new symptoms since her last clinic visit.  No issues with abnormal bleeding or bruising.  No notable toxicities from her current dose of Promacta.  No new leg pain or swelling.  No new shortness of breath or chest pain.   MEDICAL HISTORY:  #1 chronic ITP #2 Iron deficiency anemia due to heavy periods #3 hypothyroidism #4 degenerative arthritis #5 anxiety disorder #6 history of pulmonary embolism in 2004 status post IVC filter placement. 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 #7 migraine headaches  SURGICAL HISTORY: Past Surgical History:  Procedure Laterality Date   BREAST BIOPSY Left    Pt believes she had lymphnode biopsy   C secton  2002   COLONOSCOPY     COLONOSCOPY WITH PROPOFOL N/A 02/07/2018   Procedure: COLONOSCOPY WITH PROPOFOL;   Surgeon: Mauri Pole, MD;  [facility];  Service: Endoscopy;  Laterality: N/A;   IVC FILTER PLACEMENT ([place] HX)  2004   Left axillary lymph node excision biopsy  2008   POLYPECTOMY  02/07/2018   Procedure: POLYPECTOMY;  Surgeon: Mauri Pole, MD;  [facility];  Service: Endoscopy;;  Clips Placed    SOCIAL HISTORY: Social History   Socioeconomic History   Marital status: Single    Spouse name: Not on file   Number of children: Not on file   Years of education: Not on file   Highest education level: Not on file  Occupational History   Not on file  Tobacco Use   Smoking status: Never    Passive exposure: Past (lived with aunt who smoked in early 2000's, for about one year)   Smokeless tobacco: Never  Vaping Use   Vaping Use: Never used  Substance and Sexual Activity   Alcohol use: Yes    Comment: occasional: 1-2x per times per year one drink   Drug use: Not Currently    Types: Marijuana    Comment: expirimented as a teen   Sexual activity: Yes    Partners: Male    Birth control/protection: None  Other Topics Concern   Not on file  Social History Narrative   Not on file   Social Determinants of Health   Financial Resource Strain: Not on file  Food Insecurity: Not on file  Transportation Needs: Not on file  Physical Activity: Not on file  Stress: Not on file  Social Connections: Not  on file  Intimate Partner Violence: Not on file  Recently moved from [place], [place] to [place]. Nonsmoker minimal social alcohol use no drug use Currently unemployed Has a 51 year old daughter  FAMILY HISTORY: No known history of blood disorders Mother had a history of cervical cancer  maternal grandmother diabetes type 2 Maternal uncle diabetes type 2  ALLERGIES:  has no active allergies.  MEDICATIONS:  Current Outpatient Medications  Medication Sig Dispense Refill   Biotin 5 MG CAPS Take 5,000 mg by mouth daily.     Calcium  Carb-Cholecalciferol (CALCIUM 600+D) 600-20 MG-MCG TABS Take 1 tablet by mouth daily.     Cholecalciferol (VITAMIN D3) 25 MCG (1000 UT) CAPS Take 1,000 Units by mouth daily.     eltrombopag (PROMACTA) 25 MG tablet TAKE 1 TABLET (25 MG TOTAL) BY MOUTH DAILY. TAKE ON AN EMPTY STOMACH 1 HR BEFORE OR 2 HR AFTER FOOD. SEPARATE FROM IRON SUPPLEMENT. 30 tablet 1   ferrous sulfate 324 MG TBEC Take 650 mg by mouth daily with breakfast.     hydrochlorothiazide (HYDRODIURIL) 25 MG tablet Take 25 mg by mouth daily.     levothyroxine (SYNTHROID) 88 MCG tablet Take 88 mcg by mouth daily before breakfast.     Multiple Vitamin (MULTIVITAMIN) tablet Take 1 tablet by mouth daily.     ondansetron (ZOFRAN) 4 MG tablet Take 1 tablet (4 mg total) by mouth as directed for 2 doses. Take one Zofran 4 mg tablet 30-60 minutes before each prep dose 2 tablet 0   Turmeric 500 MG TABS Take 500 mg by mouth daily.     vitamin B-12 (CYANOCOBALAMIN) 1000 MCG tablet Take 1,000 mcg by mouth daily.     No current facility-administered medications for this visit.    REVIEW OF SYSTEMS:   10 Point review of Systems was done is negative except as noted above.  PHYSICAL EXAMINATION: Telemedicine visit LABORATORY DATA:  I have reviewed the data as listed  .    Latest Ref Rng & Units 12/07/2021    9:14 AM 08/19/2021    9:25 AM 05/20/2021    9:18 AM  CBC  WBC 4.0 - 10.5 K/uL 9.5   8.0   6.7    Hemoglobin 12.0 - 15.0 g/dL 13.4   12.4   13.0    Hematocrit 36.0 - 46.0 % 39.3   36.6   39.0    Platelets 150 - 400 K/uL 167   212   200         Latest Ref Rng & Units 12/07/2021    9:14 AM 08/19/2021    9:25 AM 05/20/2021    9:18 AM  CMP  Glucose 70 - 99 mg/dL 266   177   182    BUN 6 - 20 mg/dL '12   7   9    '$ Creatinine 0.44 - 1.00 mg/dL 0.75   0.60   0.69    Sodium 135 - 145 mmol/L 137   139   139    Potassium 3.5 - 5.1 mmol/L 4.3   3.9   3.9    Chloride 98 - 111 mmol/L 103   105   103    CO2 22 - 32 mmol/L '26   27   25     '$ Calcium 8.9 - 10.3 mg/dL 8.6   8.8   8.8    Total Protein 6.5 - 8.1 g/dL 7.3   7.0   7.3    Total Bilirubin 0.3 - 1.2 mg/dL  0.6   0.6   0.6    Alkaline Phos 38 - 126 U/L 86   86   103    AST 15 - 41 U/L '16   18   23    '$ ALT 0 - 44 U/L '19   17   23     '$ . Lab Results  Component Value Date   IRON 133 02/10/2021   TIBC 380 02/10/2021   IRONPCTSAT 35 02/10/2021   (Iron and TIBC)  Lab Results  Component Value Date   FERRITIN 105 08/19/2021    RADIOGRAPHIC STUDIES: I have personally reviewed the radiological images as listed and agreed with the findings in the report. No results found.  ASSESSMENT & PLAN:   46 y.o. African-American female with   1) Chronic ITP since 2012 now with acute relapse of her ITP with very labile platelet counts. No overt bleeding at this time. Patient has been on and off steroids and has been on Promacta since 2016. Was apparently on Promacta '25mg'$  po MWF. No issues with bleeding at this time. No petechiae B12 wnl. Ultrasound abdomen 06/02/2016- did show some mild splenomegaly which could be an additional factor in her thrombocytopenia. The spleen measures 12.9 x 13.9 x 6.7 cm. The calculated volume is 628 cc. Unclear etiology of splenomegaly. Liver appeared normal.  Patient's platelet counts have normalized after treatment with Rituxan weekly 4 doses with platelets improved temporarily but she needed additional treatment.  PLAN:  -Patient's latest lab results were discussed with her in detail. CBC shows normal hemoglobin of 13.4 with a normal platelet count of 167k and normal WBC count of 9.5k CMP continues to show hyperglycemia with a blood sugar of 266.  Patient was recommended to follow with primary care physician for likely diagnosis of diabetes for further evaluation and management. -No toxicities or intolerance to Promacta 25 mg p.o. daily at this time and we shall continue this.   2) Iron deficiency Anemia - likely from heavy menstrual  losses. -Tolerating the po iron without any significant issues.  Lab Results  Component Value Date   FERRITIN 105 08/19/2021   Plan  -Continue iron polysaccharide 150 mg p.o. daily  3)  Hypothyroidism - on levothyroxine As per primary care physician -continue Mx per PCP  4) hyperglycemia-likely diabetes -Patient recommended to follow-up with primary care physician for further evaluation and management -Recommended lifestyle changes to focus on appropriate diet and weight loss  4) . Patient Active Problem List   Diagnosis Date Noted   Menorrhagia with irregular cycle 12/04/2020   Body mass index (BMI) of 50.0 to 59.9 in adult ([place])    BRBPR (bright red blood per rectum)    Polyp of descending colon    Encntr for gyn exam (general) (routine) w/o abn findings 10/13/2016   Exposure to STD 10/13/2016   Chronic ITP (idiopathic thrombocytopenia) ([place]) 05/03/2016   Thrombocytopenia ([place]) 04/19/2016   Iron deficiency anemia 04/19/2016   Hypothyroidism 04/19/2016   Degenerative arthritis 04/19/2016   Generalized anxiety disorder 04/19/2016  -continue f/u with PCP  5) H/o PE in 2004-- still has IVC filter in situ. Thought to be triggered by immobility and obesity. -not on anticoagulation at this time.    FOLLOW UP: Phone visit with Dr. Irene Limbo in 3 months Labs 1 day prior to phone visit  The total time spent in the appointment was 15 minutes*.  All of the patient's questions were answered with apparent satisfaction. The patient knows to call the clinic  with any problems, questions or concerns.   Sullivan Lone MD [place] AAHIVMS [facility] Hematology/Oncology Physician [facility]  .*Total Encounter Time as defined by the Centers for Medicare and Medicaid Services includes, in addition to the face-to-face time of a patient visit (documented in the note above) non-face-to-face time: obtaining and reviewing outside history, ordering and reviewing medications, tests or  procedures, care coordination (communications with other health care professionals or caregivers) and documentation in the medical record.

## 2022-01-04 ENCOUNTER — Other Ambulatory Visit (HOSPITAL_COMMUNITY): Payer: Self-pay

## 2022-01-06 ENCOUNTER — Other Ambulatory Visit (HOSPITAL_COMMUNITY): Payer: Self-pay

## 2022-01-06 ENCOUNTER — Other Ambulatory Visit: Payer: Self-pay | Admitting: Hematology

## 2022-01-06 DIAGNOSIS — D693 Immune thrombocytopenic purpura: Secondary | ICD-10-CM

## 2022-01-06 MED ORDER — ELTROMBOPAG OLAMINE 25 MG PO TABS
ORAL_TABLET | ORAL | 1 refills | Status: DC
  Filled 2022-01-06: qty 30, 30d supply, fill #0
  Filled 2022-02-10: qty 30, 30d supply, fill #1

## 2022-01-11 ENCOUNTER — Other Ambulatory Visit (HOSPITAL_COMMUNITY): Payer: Self-pay

## 2022-02-02 ENCOUNTER — Other Ambulatory Visit (HOSPITAL_COMMUNITY): Payer: Self-pay

## 2022-02-04 ENCOUNTER — Other Ambulatory Visit (HOSPITAL_COMMUNITY): Payer: Self-pay

## 2022-02-10 ENCOUNTER — Other Ambulatory Visit (HOSPITAL_COMMUNITY): Payer: Self-pay

## 2022-02-18 ENCOUNTER — Other Ambulatory Visit (HOSPITAL_COMMUNITY): Payer: Self-pay

## 2022-02-26 ENCOUNTER — Telehealth: Payer: Self-pay

## 2022-02-26 ENCOUNTER — Other Ambulatory Visit (HOSPITAL_COMMUNITY): Payer: Self-pay

## 2022-02-26 NOTE — Telephone Encounter (Signed)
Called patient to discuss scheduling the screening colonoscopy. No answer. Left her a message on her voicemail offering 03/31/22 at [facility]. Asked for her to call back and let us know if she is available or if she is not available for this date.

## 2022-03-01 ENCOUNTER — Other Ambulatory Visit: Payer: Self-pay | Admitting: Hematology

## 2022-03-01 ENCOUNTER — Other Ambulatory Visit (HOSPITAL_COMMUNITY): Payer: Self-pay

## 2022-03-01 DIAGNOSIS — D693 Immune thrombocytopenic purpura: Secondary | ICD-10-CM

## 2022-03-01 MED ORDER — ELTROMBOPAG OLAMINE 25 MG PO TABS
ORAL_TABLET | ORAL | 1 refills | Status: DC
  Filled 2022-03-01: qty 30, 30d supply, fill #0

## 2022-03-08 ENCOUNTER — Other Ambulatory Visit: Payer: Self-pay | Admitting: *Deleted

## 2022-03-08 DIAGNOSIS — D693 Immune thrombocytopenic purpura: Secondary | ICD-10-CM

## 2022-03-08 NOTE — Telephone Encounter (Addendum)
Called the patient. No answer. Left her a message on her voicemail offering 03/31/22 at [facility] for the screening colonoscopy with Dr Silverio Decamp and our contact information.

## 2022-03-09 ENCOUNTER — Other Ambulatory Visit: Payer: Self-pay

## 2022-03-09 ENCOUNTER — Inpatient Hospital Stay: Payer: Medicaid Other | Attending: Hematology

## 2022-03-09 DIAGNOSIS — Z79890 Hormone replacement therapy (postmenopausal): Secondary | ICD-10-CM | POA: Insufficient documentation

## 2022-03-09 DIAGNOSIS — F419 Anxiety disorder, unspecified: Secondary | ICD-10-CM | POA: Diagnosis not present

## 2022-03-09 DIAGNOSIS — Z86010 Personal history of colonic polyps: Secondary | ICD-10-CM

## 2022-03-09 DIAGNOSIS — R739 Hyperglycemia, unspecified: Secondary | ICD-10-CM | POA: Diagnosis not present

## 2022-03-09 DIAGNOSIS — Z8049 Family history of malignant neoplasm of other genital organs: Secondary | ICD-10-CM | POA: Insufficient documentation

## 2022-03-09 DIAGNOSIS — N920 Excessive and frequent menstruation with regular cycle: Secondary | ICD-10-CM | POA: Diagnosis not present

## 2022-03-09 DIAGNOSIS — D500 Iron deficiency anemia secondary to blood loss (chronic): Secondary | ICD-10-CM | POA: Diagnosis not present

## 2022-03-09 DIAGNOSIS — Z833 Family history of diabetes mellitus: Secondary | ICD-10-CM | POA: Diagnosis not present

## 2022-03-09 DIAGNOSIS — M1990 Unspecified osteoarthritis, unspecified site: Secondary | ICD-10-CM | POA: Insufficient documentation

## 2022-03-09 DIAGNOSIS — E039 Hypothyroidism, unspecified: Secondary | ICD-10-CM | POA: Insufficient documentation

## 2022-03-09 DIAGNOSIS — Z86711 Personal history of pulmonary embolism: Secondary | ICD-10-CM | POA: Insufficient documentation

## 2022-03-09 DIAGNOSIS — D693 Immune thrombocytopenic purpura: Secondary | ICD-10-CM | POA: Diagnosis present

## 2022-03-09 DIAGNOSIS — Z79899 Other long term (current) drug therapy: Secondary | ICD-10-CM | POA: Insufficient documentation

## 2022-03-09 LAB — CBC WITH DIFFERENTIAL (CANCER CENTER ONLY)
Abs Immature Granulocytes: 0.04 10*3/uL (ref 0.00–0.07)
Basophils Absolute: 0 10*3/uL (ref 0.0–0.1)
Basophils Relative: 0 %
Eosinophils Absolute: 0.2 10*3/uL (ref 0.0–0.5)
Eosinophils Relative: 3 %
HCT: 37.3 % (ref 36.0–46.0)
Hemoglobin: 12.9 g/dL (ref 12.0–15.0)
Immature Granulocytes: 0 %
Lymphocytes Relative: 13 %
Lymphs Abs: 1.2 10*3/uL (ref 0.7–4.0)
MCH: 26.9 pg (ref 26.0–34.0)
MCHC: 34.6 g/dL (ref 30.0–36.0)
MCV: 77.7 fL — ABNORMAL LOW (ref 80.0–100.0)
Monocytes Absolute: 0.6 10*3/uL (ref 0.1–1.0)
Monocytes Relative: 7 %
Neutro Abs: 7 10*3/uL (ref 1.7–7.7)
Neutrophils Relative %: 77 %
Platelet Count: 260 10*3/uL (ref 150–400)
RBC: 4.8 MIL/uL (ref 3.87–5.11)
RDW: 13.3 % (ref 11.5–15.5)
WBC Count: 9.2 10*3/uL (ref 4.0–10.5)
nRBC: 0 % (ref 0.0–0.2)

## 2022-03-09 LAB — CMP (CANCER CENTER ONLY)
ALT: 15 U/L (ref 0–44)
AST: 15 U/L (ref 15–41)
Albumin: 3.9 g/dL (ref 3.5–5.0)
Alkaline Phosphatase: 73 U/L (ref 38–126)
Anion gap: 6 (ref 5–15)
BUN: 9 mg/dL (ref 6–20)
CO2: 29 mmol/L (ref 22–32)
Calcium: 9.2 mg/dL (ref 8.9–10.3)
Chloride: 102 mmol/L (ref 98–111)
Creatinine: 0.65 mg/dL (ref 0.44–1.00)
GFR, Estimated: >60 mL/min (ref >60)
Glucose, Bld: 213 mg/dL — ABNORMAL HIGH (ref 70–99)
Potassium: 4.1 mmol/L (ref 3.5–5.1)
Sodium: 137 mmol/L (ref 135–145)
Total Bilirubin: 0.6 mg/dL (ref 0.3–1.2)
Total Protein: 7.2 g/dL (ref 6.5–8.1)

## 2022-03-09 LAB — IRON AND IRON BINDING CAPACITY (CC-WL,HP ONLY)
Iron: 66 ug/dL (ref 28–170)
Saturation Ratios: 16 % (ref 10.4–31.8)
TIBC: 405 ug/dL (ref 250–450)
UIBC: 339 ug/dL (ref 148–442)

## 2022-03-09 LAB — FERRITIN: Ferritin: 52 ng/mL (ref 11–307)

## 2022-03-09 MED ORDER — PLENVU 140 G PO SOLR: ORAL | 0 refills | Status: AC

## 2022-03-09 NOTE — Telephone Encounter (Signed)
Spoke with the patient. She agrees to a colonoscopy at [place] on 03/31/22. She declines a nurse visit for instructions. States she is comfortable with written instructions and will call with any questions.

## 2022-03-10 ENCOUNTER — Inpatient Hospital Stay (HOSPITAL_BASED_OUTPATIENT_CLINIC_OR_DEPARTMENT_OTHER): Payer: Medicaid Other | Admitting: Hematology

## 2022-03-10 ENCOUNTER — Other Ambulatory Visit (HOSPITAL_COMMUNITY): Payer: Self-pay

## 2022-03-10 DIAGNOSIS — D693 Immune thrombocytopenic purpura: Secondary | ICD-10-CM | POA: Diagnosis not present

## 2022-03-10 MED ORDER — ELTROMBOPAG OLAMINE 25 MG PO TABS
ORAL_TABLET | ORAL | 11 refills | Status: DC
  Filled 2022-03-10: qty 30, 30d supply, fill #0
  Filled 2022-04-07: qty 30, 30d supply, fill #1

## 2022-03-10 MED ORDER — ELTROMBOPAG OLAMINE 25 MG PO TABS: ORAL_TABLET | ORAL | 11 refills | Status: DC

## 2022-03-11 ENCOUNTER — Other Ambulatory Visit (HOSPITAL_COMMUNITY): Payer: Self-pay

## 2022-03-15 ENCOUNTER — Telehealth: Payer: Self-pay | Admitting: Hematology

## 2022-03-15 NOTE — Telephone Encounter (Signed)
Left message with follow-up appointments per 8/23 los.

## 2022-03-16 NOTE — Progress Notes (Signed)
HEMATOLOGY/ONCOLOGY PHONE VISIT NOTE  Date of Service: .03/10/2022  PCP: .Lin Landsman, MD  CHIEF COMPLAINTS/PURPOSE OF CONSULTATION:  Follow-up for management of chronic ITP  HISTORY OF PRESENTING ILLNESS:   Please see previous note for details of initial presentation  INTERVAL HISTORY:  .I connected with Kiara Barrera on  03/10/2022 at  8:40 AM EDT by telephone visit and verified that I am speaking with the correct person using two identifiers.   I discussed the limitations, risks, security and privacy concerns of performing an evaluation and management service by telemedicine and the availability of in-person appointments. I also discussed with the patient that there may be a patient responsible charge related to this service. The patient expressed understanding and agreed to proceed.   Other persons participating in the visit and their role in the encounter: none   Patient's location: home  Provider's location: [facility]   Chief Complaint: Follow-up for continued evaluation and management of chronic ITP and review of labs  Notes no acute new symptoms since her last clinic visit.  No bleeding issues.  No abnormal bruising.  No notable toxicities from her current dose of Promacta.  Labs done yesterday were reviewed with her in detail and show stable platelet counts.Marland Kitchen   MEDICAL HISTORY:  #1 chronic ITP #2 Iron deficiency anemia due to heavy periods #3 hypothyroidism #4 degenerative arthritis #5 anxiety disorder #6 history of pulmonary embolism in 2004 status post IVC filter placement. 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 #7 migraine headaches  SURGICAL HISTORY: Past Surgical History:  Procedure Laterality Date   BREAST BIOPSY Left    Pt believes she had lymphnode biopsy   C secton  2002   COLONOSCOPY     COLONOSCOPY WITH PROPOFOL N/A 02/07/2018    Procedure: COLONOSCOPY WITH PROPOFOL;  Surgeon: Mauri Pole, MD;  Location: [facility];  Service: Endoscopy;  Laterality: N/A;   IVC FILTER PLACEMENT ([place] HX)  2004   Left axillary lymph node excision biopsy  2008   POLYPECTOMY  02/07/2018   Procedure: POLYPECTOMY;  Surgeon: Mauri Pole, MD;  Location: [facility];  Service: Endoscopy;;  Clips Placed    SOCIAL HISTORY: Social History   Socioeconomic History   Marital status: Single    Spouse name: Not on file   Number of children: Not on file   Years of education: Not on file   Highest education level: Not on file  Occupational History   Not on file  Tobacco Use   Smoking status: Never    Passive exposure: Past (lived with aunt who smoked in early 2000's, for about one year)   Smokeless tobacco: Never  Vaping Use   Vaping Use: Never used  Substance and Sexual Activity   Alcohol use: Yes    Comment: occasional: 1-2x per times per year one drink   Drug use: Not Currently    Types: Marijuana    Comment: expirimented as a teen   Sexual activity: Yes    Partners: Male    Birth control/protection: None  Other Topics Concern   Not on file  Social History Narrative   Not on file   Social Determinants of Health   Financial Resource Strain: Not on file  Food Insecurity: Not on file  Transportation Needs: Not on file  Physical Activity: Not on file  Stress: Not on file  Social  Connections: Not on file  Intimate Partner Violence: Not on file  Recently moved from [place], [place] to [place]. Nonsmoker minimal social alcohol use no drug use Currently unemployed Has a 29 year old daughter  FAMILY HISTORY: No known history of blood disorders Mother had a history of cervical cancer  maternal grandmother diabetes type 2 Maternal uncle diabetes type 2  ALLERGIES:  has no active allergies.  MEDICATIONS:  Current Outpatient Medications  Medication Sig Dispense Refill   Biotin 5 MG CAPS Take 5,000 mg by  mouth daily.     Calcium Carb-Cholecalciferol (CALCIUM 600+D) 600-20 MG-MCG TABS Take 1 tablet by mouth daily.     Cholecalciferol (VITAMIN D3) 25 MCG (1000 UT) CAPS Take 1,000 Units by mouth daily.     eltrombopag (PROMACTA) 25 MG tablet TAKE 1 TABLET (25 MG TOTAL) BY MOUTH DAILY. TAKE ON AN EMPTY STOMACH 1 HR BEFORE OR 2 HR AFTER FOOD. SEPARATE FROM IRON SUPPLEMENT. 30 tablet 11   ferrous sulfate 324 MG TBEC Take 650 mg by mouth daily with breakfast.     hydrochlorothiazide (HYDRODIURIL) 25 MG tablet Take 25 mg by mouth daily.     levothyroxine (SYNTHROID) 88 MCG tablet Take 88 mcg by mouth daily before breakfast.     Multiple Vitamin (MULTIVITAMIN) tablet Take 1 tablet by mouth daily.     ondansetron (ZOFRAN) 4 MG tablet Take 1 tablet (4 mg total) by mouth as directed for 2 doses. Take one Zofran 4 mg tablet 30-60 minutes before each prep dose 2 tablet 0   PEG-KCl-NaCl-NaSulf-Na Asc-C (PLENVU) 140 g SOLR Split dose prep following the doctor instruction 1 each 0   Turmeric 500 MG TABS Take 500 mg by mouth daily.     vitamin B-12 (CYANOCOBALAMIN) 1000 MCG tablet Take 1,000 mcg by mouth daily.     No current facility-administered medications for this visit.    REVIEW OF SYSTEMS:   10 Point review of Systems was done is negative except as noted above.  PHYSICAL EXAMINATION: Telemedicine visit   LABORATORY DATA:  I have reviewed the data as listed  .    Latest Ref Rng & Units 03/09/2022    8:35 AM 12/07/2021    9:14 AM 08/19/2021    9:25 AM  CBC  WBC 4.0 - 10.5 K/uL 9.2  9.5  8.0   Hemoglobin 12.0 - 15.0 g/dL 12.9  13.4  12.4   Hematocrit 36.0 - 46.0 % 37.3  39.3  36.6   Platelets 150 - 400 K/uL 260  167  212        Latest Ref Rng & Units 03/09/2022    8:35 AM 12/07/2021    9:14 AM 08/19/2021    9:25 AM  CMP  Glucose 70 - 99 mg/dL 213  266  177   BUN 6 - 20 mg/dL '9  12  7   '$ Creatinine 0.44 - 1.00 mg/dL 0.65  0.75  0.60   Sodium 135 - 145 mmol/L 137  137  139   Potassium 3.5 -  5.1 mmol/L 4.1  4.3  3.9   Chloride 98 - 111 mmol/L 102  103  105   CO2 22 - 32 mmol/L '29  26  27   '$ Calcium 8.9 - 10.3 mg/dL 9.2  8.6  8.8   Total Protein 6.5 - 8.1 g/dL 7.2  7.3  7.0   Total Bilirubin 0.3 - 1.2 mg/dL 0.6  0.6  0.6   Alkaline Phos 38 - 126 U/L 73  86  86   AST 15 - 41 U/L '15  16  18   '$ ALT 0 - 44 U/L '15  19  17    '$ . Lab Results  Component Value Date   IRON 66 03/09/2022   TIBC 405 03/09/2022   IRONPCTSAT 16 03/09/2022   (Iron and TIBC)  Lab Results  Component Value Date   FERRITIN 52 03/09/2022    RADIOGRAPHIC STUDIES: I have personally reviewed the radiological images as listed and agreed with the findings in the report. No results found.  ASSESSMENT & PLAN:   46 y.o. African-American female with   1) Chronic ITP since 2012 now with acute relapse of her ITP with very labile platelet counts. No overt bleeding at this time. Patient has been on and off steroids and has been on Promacta since 2016. Was apparently on Promacta '25mg'$  po MWF. No issues with bleeding at this time. No petechiae B12 wnl. Ultrasound abdomen 06/02/2016- did show some mild splenomegaly which could be an additional factor in her thrombocytopenia. The spleen measures 12.9 x 13.9 x 6.7 cm. The calculated volume is 628 cc. Unclear etiology of splenomegaly. Liver appeared normal.  Patient's platelet counts have normalized after treatment with Rituxan weekly 4 doses with platelets improved temporarily but she needed additional treatment.  PLAN:  -Patient's latest labs were discussed in detail with her CBC is within normal limits with WBC count of 9.2k hemoglobin of 12.9 and platelets of 260k' CMP stable Patient with no bleeding issues, no leg swelling, no chest pain or shortness of breath. Patient notes no notable toxicities from Promacta We shall continue her Promacta at 25 mg p.o. daily at this time.  If her platelets continue to remain about 200k we could consider reducing her Promacta  by 1 day a week.   2) Iron deficiency Anemia - likely from heavy menstrual losses. -Tolerating the po iron without any significant issues.  Lab Results  Component Value Date   FERRITIN 52 03/09/2022   Plan  -Continue iron polysaccharide 150 mg.  P.o. daily.  3)  Hypothyroidism - on levothyroxine As per primary care physician -continue Mx per PCP  4) hyperglycemia-likely diabetes -Patient recommended to follow-up with primary care physician for further evaluation and management -Recommended lifestyle changes to focus on appropriate diet and weight loss  4) . Patient Active Problem List   Diagnosis Date Noted   Menorrhagia with irregular cycle 12/04/2020   Body mass index (BMI) of 50.0 to 59.9 in adult ([place])    BRBPR (bright red blood per rectum)    Polyp of descending colon    Encntr for gyn exam (general) (routine) w/o abn findings 10/13/2016   Exposure to STD 10/13/2016   Chronic ITP (idiopathic thrombocytopenia) ([place]) 05/03/2016   Thrombocytopenia ([place]) 04/19/2016   Iron deficiency anemia 04/19/2016   Hypothyroidism 04/19/2016   Degenerative arthritis 04/19/2016   Generalized anxiety disorder 04/19/2016  -continue f/u with PCP  5) H/o PE in 2004-- still has IVC filter in situ. Thought to be triggered by immobility and obesity. -not on anticoagulation at this time.  FOLLOW UP: Phone visit with Dr Irene Limbo in 4 weeks Labs 1 day prior to phone visit  The total time spent in the appointment was 15 minutes*.  All of the patient's questions were answered with apparent satisfaction. The patient knows to call the clinic with any problems, questions or concerns.   Sullivan Lone MD MS AAHIVMS [facility] Hematology/Oncology Physician [facility]  .*Total Encounter Time  as defined by the Centers for Medicare and Medicaid Services includes, in addition to the face-to-face time of a patient visit (documented in the note above) non-face-to-face time: obtaining and  reviewing outside history, ordering and reviewing medications, tests or procedures, care coordination (communications with other health care professionals or caregivers) and documentation in the medical record.

## 2022-03-24 ENCOUNTER — Encounter (HOSPITAL_COMMUNITY): Payer: Self-pay | Admitting: Gastroenterology

## 2022-03-24 NOTE — Progress Notes (Signed)
Attempted to obtain medical history via telephone, unable to reach at this time. HIPAA compliant voicemail message left requesting return call to pre surgical testing department. 

## 2022-03-31 ENCOUNTER — Encounter (HOSPITAL_COMMUNITY)
Admission: RE | Disposition: A | Discharge status: Home / Self Care | Payer: Self-pay | Source: Home / Self Care | Attending: Gastroenterology

## 2022-03-31 ENCOUNTER — Other Ambulatory Visit: Payer: Self-pay

## 2022-03-31 ENCOUNTER — Ambulatory Visit (HOSPITAL_COMMUNITY)
Admission: RE | Admit: 2022-03-31 | Discharge: 2022-03-31 | Disposition: A | Discharge status: Home / Self Care | Payer: Medicaid Other | Attending: Gastroenterology | Admitting: Gastroenterology

## 2022-03-31 ENCOUNTER — Ambulatory Visit (HOSPITAL_BASED_OUTPATIENT_CLINIC_OR_DEPARTMENT_OTHER): Payer: Medicaid Other | Admitting: Anesthesiology

## 2022-03-31 ENCOUNTER — Encounter (HOSPITAL_COMMUNITY): Payer: Self-pay | Admitting: Gastroenterology

## 2022-03-31 ENCOUNTER — Ambulatory Visit (HOSPITAL_COMMUNITY): Payer: Medicaid Other | Admitting: Anesthesiology

## 2022-03-31 DIAGNOSIS — Z86711 Personal history of pulmonary embolism: Secondary | ICD-10-CM | POA: Insufficient documentation

## 2022-03-31 DIAGNOSIS — K5730 Diverticulosis of large intestine without perforation or abscess without bleeding: Secondary | ICD-10-CM | POA: Diagnosis not present

## 2022-03-31 DIAGNOSIS — Z6841 Body Mass Index (BMI) 40.0 and over, adult: Secondary | ICD-10-CM | POA: Insufficient documentation

## 2022-03-31 DIAGNOSIS — Z860100 Personal history of colon polyps, unspecified: Secondary | ICD-10-CM

## 2022-03-31 DIAGNOSIS — E039 Hypothyroidism, unspecified: Secondary | ICD-10-CM

## 2022-03-31 DIAGNOSIS — K644 Residual hemorrhoidal skin tags: Secondary | ICD-10-CM | POA: Diagnosis not present

## 2022-03-31 DIAGNOSIS — D693 Immune thrombocytopenic purpura: Secondary | ICD-10-CM | POA: Insufficient documentation

## 2022-03-31 DIAGNOSIS — Z09 Encounter for follow-up examination after completed treatment for conditions other than malignant neoplasm: Secondary | ICD-10-CM | POA: Insufficient documentation

## 2022-03-31 DIAGNOSIS — K648 Other hemorrhoids: Secondary | ICD-10-CM

## 2022-03-31 DIAGNOSIS — D649 Anemia, unspecified: Secondary | ICD-10-CM | POA: Diagnosis not present

## 2022-03-31 HISTORY — PX: COLONOSCOPY WITH PROPOFOL: SHX5780

## 2022-03-31 SURGERY — COLONOSCOPY WITH PROPOFOL
Anesthesia: Monitor Anesthesia Care

## 2022-03-31 MED ORDER — PROPOFOL 500 MG/50ML IV EMUL
INTRAVENOUS | Status: AC
  Filled 2022-03-31: qty 50

## 2022-03-31 MED ORDER — PHENYLEPHRINE HCL (PRESSORS) 10 MG/ML IV SOLN
INTRAVENOUS | Status: DC | PRN
  Administered 2022-03-31: 100 ug via INTRAVENOUS

## 2022-03-31 MED ORDER — LACTATED RINGERS IV SOLN: INTRAVENOUS | Status: DC

## 2022-03-31 MED ORDER — LIDOCAINE HCL 1 % IJ SOLN
INTRAMUSCULAR | Status: DC | PRN
  Administered 2022-03-31: 50 mg via INTRADERMAL

## 2022-03-31 MED ORDER — SODIUM CHLORIDE 0.9 % IV SOLN: INTRAVENOUS | Status: DC

## 2022-03-31 MED ORDER — PROPOFOL 500 MG/50ML IV EMUL
INTRAVENOUS | Status: DC | PRN
  Administered 2022-03-31: 110 ug/kg/min via INTRAVENOUS

## 2022-03-31 SURGICAL SUPPLY — 22 items

## 2022-03-31 NOTE — Discharge Instructions (Signed)
YOU HAD AN ENDOSCOPIC PROCEDURE TODAY: Refer to the procedure report and other information in the discharge instructions given to you for any specific questions about what was found during the examination. If this information does not answer your questions, please call [place] office at [phone] to clarify.  ° °YOU SHOULD EXPECT: Some feelings of bloating in the abdomen. Passage of more gas than usual. Walking can help get rid of the air that was put into your GI tract during the procedure and reduce the bloating. If you had a lower endoscopy (such as a colonoscopy or flexible sigmoidoscopy) you may notice spotting of blood in your stool or on the toilet paper. Some abdominal soreness may be present for a day or two, also. ° °DIET: Your first meal following the procedure should be a light meal and then it is ok to progress to your normal diet. A half-sandwich or bowl of soup is an example of a good first meal. Heavy or fried foods are harder to digest and may make you feel nauseous or bloated. Drink plenty of fluids but you should avoid alcoholic beverages for 24 hours. If you had a esophageal dilation, please see attached instructions for diet.   ° °ACTIVITY: Your care partner should take you home directly after the procedure. You should plan to take it easy, moving slowly for the rest of the day. You can resume normal activity the day after the procedure however YOU SHOULD NOT DRIVE, use power tools, machinery or perform tasks that involve climbing or major physical exertion for 24 hours (because of the sedation medicines used during the test).  ° °SYMPTOMS TO REPORT IMMEDIATELY: °A gastroenterologist can be reached at any hour. Please call [phone]  for any of the following symptoms:  °Following lower endoscopy (colonoscopy, flexible sigmoidoscopy) °Excessive amounts of blood in the stool  °Significant tenderness, worsening of abdominal pains  °Swelling of the abdomen that is new, acute  °Fever of 100° or  higher  °Following upper endoscopy (EGD, EUS, ERCP, esophageal dilation) °Vomiting of blood or coffee ground material  °New, significant abdominal pain  °New, significant chest pain or pain under the shoulder blades  °Painful or persistently difficult swallowing  °New shortness of breath  °Black, tarry-looking or red, bloody stools ° °FOLLOW UP:  °If any biopsies were taken you will be contacted by phone or by letter within the next 1-3 weeks. Call [phone]  if you have not heard about the biopsies in 3 weeks.  °Please also call with any specific questions about appointments or follow up tests. ° °

## 2022-03-31 NOTE — Anesthesia Preprocedure Evaluation (Addendum)
Anesthesia Evaluation  Patient identified by MRN, date of birth, ID band Patient awake    Reviewed: Allergy & Precautions, NPO status , Patient's Chart, lab work & pertinent test results  Airway Mallampati: II  TM Distance: >3 FB Neck ROM: Full    Dental no notable dental hx. (+) Teeth Intact, Dental Advisory Given   Pulmonary PE Hx/o PTE S/P IVC filter   Pulmonary exam normal breath sounds clear to auscultation       Cardiovascular negative cardio ROS Normal cardiovascular exam Rhythm:Regular Rate:Normal     Neuro/Psych PSYCHIATRIC DISORDERS Anxiety    GI/Hepatic Neg liver ROS, Screening colonoscopy   Endo/Other  Hypothyroidism Morbid obesity  Renal/GU negative Renal ROS  negative genitourinary   Musculoskeletal  (+) Arthritis , Osteoarthritis,    Abdominal (+) + obese,   Peds  Hematology  (+) Blood dyscrasia, anemia , Chronic ITP   Anesthesia Other Findings   Reproductive/Obstetrics                            Anesthesia Physical Anesthesia Plan  ASA: 3  Anesthesia Plan: MAC   Post-op Pain Management: Minimal or no pain anticipated   Induction: Intravenous  PONV Risk Score and Plan: 3 and Treatment may vary due to age or medical condition and Propofol infusion  Airway Management Planned: Natural Airway and Nasal Cannula  Additional Equipment: None  Intra-op Plan:   Post-operative Plan:   Informed Consent: I have reviewed the patients History and Physical, chart, labs and discussed the procedure including the risks, benefits and alternatives for the proposed anesthesia with the patient or authorized representative who has indicated his/her understanding and acceptance.     Dental advisory given  Plan Discussed with: CRNA and Anesthesiologist  Anesthesia Plan Comments:        Anesthesia Quick Evaluation

## 2022-03-31 NOTE — H&P (Signed)
[facility] History and Physical   Primary Care Physician:  Lin Landsman, MD   Reason for Procedure:   H/o advanced adenomatous colon polyps  Plan:    Colonoscopy with possible interventions     HPI: Kiara Barrera is a 46 y.o. female here for colonoscopy for surveillance with h/o advanced adenomatous colon polyps.  The risks and benefits as well as alternatives of endoscopic procedure(s) have been discussed and reviewed. All questions answered. The patient agrees to proceed.    Past Medical History:  Diagnosis Date   Anemia    Blood clots Left lung 2004   pt states hx of blood clots had PE   Hypothyroidism    Lower back pain    Obesity     Past Surgical History:  Procedure Laterality Date   BREAST BIOPSY Left    Pt believes she had lymphnode biopsy   C secton  2002   COLONOSCOPY     COLONOSCOPY WITH PROPOFOL N/A 02/07/2018   Procedure: COLONOSCOPY WITH PROPOFOL;  Surgeon: Mauri Pole, MD;  Location: [facility];  Service: Endoscopy;  Laterality: N/A;   IVC FILTER PLACEMENT ([place] HX)  2004   Left axillary lymph node excision biopsy  2008   POLYPECTOMY  02/07/2018   Procedure: POLYPECTOMY;  Surgeon: Mauri Pole, MD;  [facility]: [facility];  Service: Endoscopy;;  Clips Placed    Prior to Admission medications   Medication Sig Start Date End Date Taking? Authorizing Provider  acetaminophen (TYLENOL) 500 MG tablet Take 1,500 mg by mouth daily as needed for moderate pain.   Yes [provider]  Biotin 5 MG CAPS Take 5,000 mg by mouth daily.   Yes [provider]  Calcium Carb-Cholecalciferol (CALCIUM 600+D) 600-20 MG-MCG TABS Take 1 tablet by mouth daily.   Yes [provider]  Cholecalciferol (VITAMIN D3) 25 MCG (1000 UT) CAPS Take 1,000 Units by mouth daily.   Yes [provider]  eltrombopag (PROMACTA) 25 MG tablet TAKE 1 TABLET (25 MG TOTAL) BY MOUTH DAILY. TAKE ON AN EMPTY STOMACH 1 HR BEFORE  OR 2 HR AFTER FOOD. SEPARATE FROM IRON SUPPLEMENT. 03/10/22 03/10/23 Yes Brunetta Genera, MD  ferrous sulfate 325 (65 FE) MG tablet Take 325 mg by mouth 2 (two) times daily.   Yes [provider]  hydrochlorothiazide (HYDRODIURIL) 25 MG tablet Take 25 mg by mouth daily. 07/28/20  Yes [provider]  levothyroxine (SYNTHROID) 88 MCG tablet Take 88 mcg by mouth daily before breakfast. 07/20/21  Yes [provider]  Multiple Vitamin (MULTIVITAMIN) tablet Take 1 tablet by mouth daily.   Yes [provider]  PEG-KCl-NaCl-NaSulf-Na Asc-C (PLENVU) 140 g SOLR Split dose prep following the doctor instruction 03/09/22  Yes Layman Gully, Venia Minks, MD  vitamin B-12 (CYANOCOBALAMIN) 1000 MCG tablet Take 1,000 mcg by mouth daily.   Yes [provider]  Turmeric 500 MG TABS Take 500 mg by mouth daily.    [provider]    Current Facility-Administered Medications  Medication Dose Route Frequency Provider Last Rate Last Admin   0.9 %  sodium chloride infusion   Intravenous Continuous Verleen Stuckey, Venia Minks, MD        Allergies as of 03/09/2022   (No Active Allergies)    Family History  Problem Relation Age of Onset   Ovarian cancer Mother    Kidney disease Mother    Kidney disease Brother    Ovarian cancer Maternal Aunt    Diabetes Maternal Grandmother  Kidney disease Maternal Grandmother    Colon cancer Neg Hx    Colon polyps Neg Hx    Esophageal cancer Neg Hx    Rectal cancer Neg Hx    Stomach cancer Neg Hx     Social History   Socioeconomic History   Marital status: Single    Spouse name: Not on file   Number of children: Not on file   Years of education: Not on file   Highest education level: Not on file  Occupational History   Not on file  Tobacco Use   Smoking status: Never    Passive exposure: Past (lived with aunt who smoked in early 2000's, for about one year)   Smokeless tobacco: Never  Vaping Use   Vaping Use: Never used   Substance and Sexual Activity   Alcohol use: Yes    Comment: occasional: 1-2x per times per year one drink   Drug use: Not Currently    Types: Marijuana    Comment: expirimented as a teen   Sexual activity: Yes    Partners: Male    Birth control/protection: None  Other Topics Concern   Not on file  Social History Narrative   Not on file   Social Determinants of Health   Financial Resource Strain: Not on file  Food Insecurity: Not on file  Transportation Needs: Not on file  Physical Activity: Not on file  Stress: Not on file  Social Connections: Not on file  Intimate Partner Violence: Not on file    Review of Systems:  All other review of systems negative except as mentioned in the HPI.  Physical Exam: Vital signs in last 24 hours: Temp:  [97.1 F (36.2 C)] 97.1 F (36.2 C) (09/13 0827) Pulse Rate:  [84] 84 (09/13 0827) Resp:  [16] 16 (09/13 0827) BP: (143)/(85) 143/85 (09/13 0827) SpO2:  [96 %] 96 % (09/13 0827) Weight:  [150.6 kg] 150.6 kg (09/13 0827)   General:   Alert, NAD Lungs:  Clear .   Heart:  Regular rate and rhythm Abdomen:  Soft, nontender and nondistended. Neuro/Psych:  Alert and cooperative. Normal mood and affect. A and O x 3   K. Denzil Magnuson , MD [phone]

## 2022-03-31 NOTE — Op Note (Signed)
[facility] Patient Name: Kiara Barrera Procedure Date: 03/31/2022 MRN: [id number] Attending MD: Mauri Pole , MD Date of Birth: [birth year] CSN: [id number] Age: 46 Admit Type: Outpatient Procedure:                Colonoscopy Indications:              High risk colon cancer surveillance: Personal                            history of colonic polyps, High risk colon cancer                            surveillance: Personal history of adenoma (10 mm or                            greater in size) Providers:                Mauri Pole, MD, Dulcy Fanny, Hinton Dyer Technician, Technician, Brien Mates, RNFA Referring MD:              Medicines:                Monitored Anesthesia Care Complications:            No immediate complications. Estimated Blood Loss:     Estimated blood loss was minimal. Procedure:                Pre-Anesthesia Assessment:                           - Prior to the procedure, a History and Physical                            was performed, and patient medications and                            allergies were reviewed. The patient's tolerance of                            previous anesthesia was also reviewed. The risks                            and benefits of the procedure and the sedation                            options and risks were discussed with the patient.                            All questions were answered, and informed consent                            was obtained. Prior Anticoagulants: The patient has  taken no previous anticoagulant or antiplatelet                            agents. ASA Grade Assessment: III - A patient with                            severe systemic disease. After reviewing the risks                            and benefits, the patient was deemed in                            satisfactory condition to undergo the procedure.                            After obtaining informed consent, the colonoscope                            was passed under direct vision. Throughout the                            procedure, the patient's blood pressure, pulse, and                            oxygen saturations were monitored continuously. The                            [id number]-HQ190L ([id number]) Olympus colonoscope was                            introduced through the anus and advanced to the the                            cecum, identified by appendiceal orifice and                            ileocecal valve. The colonoscopy was performed                            without difficulty. The patient tolerated the                            procedure well. The quality of the bowel                            preparation was excellent. The ileocecal valve,                            appendiceal orifice, and rectum were photographed. Scope In: 9:34:51 AM Scope Out: 9:43:43 AM Scope Withdrawal Time: 0 hours 6 minutes 24 seconds  Total Procedure Duration: 0 hours 8 minutes 52 seconds  Findings:      The perianal and digital rectal examinations were normal.      A few small-mouthed diverticula were found in the sigmoid  colon.      Non-bleeding external and internal hemorrhoids were found during       retroflexion. The hemorrhoids were small. Impression:               - Diverticulosis in the sigmoid colon.                           - Non-bleeding external and internal hemorrhoids.                           - No specimens collected. Moderate Sedation:      N/A Recommendation:           - Resume previous diet.                           - Continue present medications.                           - Await pathology results.                           - Repeat colonoscopy in 5 years for surveillance                            based on pathology results. Procedure Code(s):        --- Professional ---                           T1572, Colorectal cancer  screening; colonoscopy on                            individual at high risk Diagnosis Code(s):        --- Professional ---                           K64.8, Other hemorrhoids                           Z86.010, Personal history of colonic polyps                           K57.30, Diverticulosis of large intestine without                            perforation or abscess without bleeding CPT copyright 2019 [employer]. All rights reserved. The codes documented in this report are preliminary and upon coder review may  be revised to meet current compliance requirements. Mauri Pole, MD 03/31/2022 9:52:13 AM This report has been signed electronically. Number of Addenda: 0

## 2022-03-31 NOTE — Anesthesia Postprocedure Evaluation (Signed)
Anesthesia Post Note  Patient: Kiara Barrera  Procedure(s) Performed: COLONOSCOPY WITH PROPOFOL     Patient location during evaluation: PACU Anesthesia Type: MAC Level of consciousness: awake and alert and oriented Pain management: pain level controlled Vital Signs Assessment: post-procedure vital signs reviewed and stable Respiratory status: spontaneous breathing, nonlabored ventilation and respiratory function stable Cardiovascular status: stable and blood pressure returned to baseline Postop Assessment: no apparent nausea or vomiting Anesthetic complications: no   No notable events documented.  Last Vitals:  Vitals:   03/31/22 0827 03/31/22 0952  BP: (!) 143/85 (!) 106/59  Pulse: 84 85  Resp: 16 18  Temp: (!) 36.2 C 36.6 C  SpO2: 96% 100%    Last Pain:  Vitals:   03/31/22 0952  TempSrc: Oral  PainSc: 0-No pain                 Taleigha Pinson A.

## 2022-03-31 NOTE — Transfer of Care (Signed)
Immediate Anesthesia Transfer of Care Note  Patient: Kiara Barrera  Procedure(s) Performed: COLONOSCOPY WITH PROPOFOL  Patient Location: PACU and Endoscopy Unit  Anesthesia Type:MAC  Level of Consciousness: awake, alert , oriented and patient cooperative  Airway & Oxygen Therapy: Patient Spontanous Breathing and Patient connected to face mask oxygen  Post-op Assessment: Report given to RN and Post -op Vital signs reviewed and stable  Post vital signs: Reviewed and stable  Last Vitals:  Vitals Value Taken Time  BP    Temp    Pulse 85 03/31/22 0952  Resp 18 03/31/22 0952  SpO2 100 % 03/31/22 0952  Vitals shown include unvalidated device data.  Last Pain:  Vitals:   03/31/22 0827  TempSrc: Temporal  PainSc: 0-No pain         Complications: No notable events documented.

## 2022-04-01 ENCOUNTER — Other Ambulatory Visit (HOSPITAL_COMMUNITY): Payer: Self-pay

## 2022-04-01 ENCOUNTER — Encounter (HOSPITAL_COMMUNITY): Payer: Self-pay | Admitting: Gastroenterology

## 2022-04-05 ENCOUNTER — Other Ambulatory Visit (HOSPITAL_COMMUNITY): Payer: Self-pay

## 2022-04-07 ENCOUNTER — Other Ambulatory Visit (HOSPITAL_COMMUNITY): Payer: Self-pay

## 2022-04-07 ENCOUNTER — Other Ambulatory Visit: Payer: Self-pay

## 2022-04-07 DIAGNOSIS — D693 Immune thrombocytopenic purpura: Secondary | ICD-10-CM

## 2022-04-08 ENCOUNTER — Inpatient Hospital Stay: Payer: Medicaid Other | Attending: Hematology

## 2022-04-08 ENCOUNTER — Other Ambulatory Visit: Payer: Self-pay

## 2022-04-08 DIAGNOSIS — M1990 Unspecified osteoarthritis, unspecified site: Secondary | ICD-10-CM | POA: Insufficient documentation

## 2022-04-08 DIAGNOSIS — E039 Hypothyroidism, unspecified: Secondary | ICD-10-CM | POA: Insufficient documentation

## 2022-04-08 DIAGNOSIS — G43909 Migraine, unspecified, not intractable, without status migrainosus: Secondary | ICD-10-CM | POA: Diagnosis not present

## 2022-04-08 DIAGNOSIS — Z79899 Other long term (current) drug therapy: Secondary | ICD-10-CM | POA: Diagnosis not present

## 2022-04-08 DIAGNOSIS — D693 Immune thrombocytopenic purpura: Secondary | ICD-10-CM | POA: Diagnosis present

## 2022-04-08 DIAGNOSIS — Z86711 Personal history of pulmonary embolism: Secondary | ICD-10-CM | POA: Insufficient documentation

## 2022-04-08 LAB — CMP (CANCER CENTER ONLY)
ALT: 11 U/L (ref 0–44)
AST: 13 U/L — ABNORMAL LOW (ref 15–41)
Albumin: 3.7 g/dL (ref 3.5–5.0)
Alkaline Phosphatase: 74 U/L (ref 38–126)
Anion gap: 7 (ref 5–15)
BUN: 8 mg/dL (ref 6–20)
CO2: 30 mmol/L (ref 22–32)
Calcium: 9.1 mg/dL (ref 8.9–10.3)
Chloride: 103 mmol/L (ref 98–111)
Creatinine: 0.6 mg/dL (ref 0.44–1.00)
GFR, Estimated: >60 mL/min (ref >60)
Glucose, Bld: 174 mg/dL — ABNORMAL HIGH (ref 70–99)
Potassium: 4.1 mmol/L (ref 3.5–5.1)
Sodium: 140 mmol/L (ref 135–145)
Total Bilirubin: 0.6 mg/dL (ref 0.3–1.2)
Total Protein: 7.3 g/dL (ref 6.5–8.1)

## 2022-04-08 LAB — CBC WITH DIFFERENTIAL (CANCER CENTER ONLY)
Abs Immature Granulocytes: 0.04 10*3/uL (ref 0.00–0.07)
Basophils Absolute: 0 10*3/uL (ref 0.0–0.1)
Basophils Relative: 0 %
Eosinophils Absolute: 0.4 10*3/uL (ref 0.0–0.5)
Eosinophils Relative: 4 %
HCT: 37.4 % (ref 36.0–46.0)
Hemoglobin: 12.6 g/dL (ref 12.0–15.0)
Immature Granulocytes: 0 %
Lymphocytes Relative: 14 %
Lymphs Abs: 1.3 10*3/uL (ref 0.7–4.0)
MCH: 26.6 pg (ref 26.0–34.0)
MCHC: 33.7 g/dL (ref 30.0–36.0)
MCV: 79.1 fL — ABNORMAL LOW (ref 80.0–100.0)
Monocytes Absolute: 0.6 10*3/uL (ref 0.1–1.0)
Monocytes Relative: 6 %
Neutro Abs: 7 10*3/uL (ref 1.7–7.7)
Neutrophils Relative %: 76 %
Platelet Count: 239 10*3/uL (ref 150–400)
RBC: 4.73 MIL/uL (ref 3.87–5.11)
RDW: 13.9 % (ref 11.5–15.5)
WBC Count: 9.3 10*3/uL (ref 4.0–10.5)
nRBC: 0 % (ref 0.0–0.2)

## 2022-04-08 LAB — IRON AND IRON BINDING CAPACITY (CC-WL,HP ONLY)
Iron: 58 ug/dL (ref 28–170)
Saturation Ratios: 13 % (ref 10.4–31.8)
TIBC: 434 ug/dL (ref 250–450)
UIBC: 376 ug/dL (ref 148–442)

## 2022-04-08 LAB — FERRITIN: Ferritin: 49 ng/mL (ref 11–307)

## 2022-04-09 ENCOUNTER — Other Ambulatory Visit (HOSPITAL_COMMUNITY): Payer: Self-pay

## 2022-04-09 ENCOUNTER — Inpatient Hospital Stay (HOSPITAL_BASED_OUTPATIENT_CLINIC_OR_DEPARTMENT_OTHER): Payer: Medicaid Other | Admitting: Hematology

## 2022-04-09 DIAGNOSIS — D693 Immune thrombocytopenic purpura: Secondary | ICD-10-CM | POA: Diagnosis not present

## 2022-04-14 NOTE — Progress Notes (Signed)
HEMATOLOGY/ONCOLOGY PHONE VISIT NOTE  Date of Service: 04/09/2022  PCP: .Lin Landsman, MD  CHIEF COMPLAINTS/PURPOSE OF CONSULTATION:  Follow-up for management of chronic ITP  HISTORY OF PRESENTING ILLNESS:   Please see previous note for details of initial presentation  INTERVAL HISTORY: I connected with [name] on  04/09/2022 at  8:40 AM EDT by telephone visit and verified that I am speaking with the correct person using two identifiers.   I discussed the limitations, risks, security and privacy concerns of performing an evaluation and management service by telemedicine and the availability of in-person appointments. I also discussed with the patient that there may be a patient responsible charge related to this service. The patient expressed understanding and agreed to proceed.   Other persons participating in the visit and their role in the encounter: none   Patient's location: home  Provider's location: [facility]   Chief Complaint: Follow-up for continued evaluation and management of chronic ITP and review of labs  Kiara Barrera is a 46 y.o. female who was contacted via phone for evaluation and management of chronic ITP and review of recent labs. She reports She is doing well with no new symptoms or concerns.  We discussed her recent colonoscopy done 03/31/2022 which revealed diverticulosis of sigmoid colon, no internal bleeds, and non bleeding hemorrhoids. No other significant findings at this time.  No new abnormal bleeding or bruising.  No other new or acute focal symptoms.  No notable toxicities from her current dose of Promacta.  Labs done yesterday were reviewed with her in detail and show stable platelet counts. We discussed we will continue to monitor  with goal of <200k platelets further before reducing Promacta.  MEDICAL HISTORY:  #1 chronic ITP #2 Iron deficiency anemia due to heavy periods #3 hypothyroidism #4 degenerative  arthritis #5 anxiety disorder #6 history of pulmonary embolism in 2004 status post IVC filter placement. 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 #7 migraine headaches  SURGICAL HISTORY: Past Surgical History:  Procedure Laterality Date   BREAST BIOPSY Left    Pt believes she had lymphnode biopsy   C secton  2002   COLONOSCOPY     COLONOSCOPY WITH PROPOFOL N/A 02/07/2018   Procedure: COLONOSCOPY WITH PROPOFOL;  Surgeon: Mauri Pole, MD;  Location: [facility];  Service: Endoscopy;  Laterality: N/A;   COLONOSCOPY WITH PROPOFOL N/A 03/31/2022   Procedure: COLONOSCOPY WITH PROPOFOL;  Surgeon: Mauri Pole, MD;  Location: [facility];  Service: Gastroenterology;  Laterality: N/A;   IVC FILTER PLACEMENT ([place] HX)  2004   Left axillary lymph node excision biopsy  2008   POLYPECTOMY  02/07/2018   Procedure: POLYPECTOMY;  Surgeon: Mauri Pole, MD;  Location: [facility];  Service: Endoscopy;;  Clips Placed    SOCIAL HISTORY: Social History   Socioeconomic History   Marital status: Single    Spouse name: Not on file   Number of children: Not on file   Years of education: Not on file   Highest education level: Not on file  Occupational History   Not on file  Tobacco Use   Smoking status: Never    Passive exposure: Past (lived with aunt who smoked in early 2000's, for about one year)   Smokeless tobacco: Never  Vaping Use   Vaping Use: Never used  Substance and Sexual Activity   Alcohol  use: Yes    Comment: occasional: 1-2x per times per year one drink   Drug use: Not Currently    Types: Marijuana    Comment: expirimented as a teen   Sexual activity: Yes    Partners: Male    Birth control/protection: None  Other Topics Concern   Not on file  Social History Narrative   Not on file   Social Determinants of Health   Financial Resource Strain: Not on  file  Food Insecurity: Not on file  Transportation Needs: Not on file  Physical Activity: Not on file  Stress: Not on file  Social Connections: Not on file  Intimate Partner Violence: Not on file  Recently moved from [place], [place] to [place]. Nonsmoker minimal social alcohol use no drug use Currently unemployed Has a 58 year old daughter  FAMILY HISTORY: No known history of blood disorders Mother had a history of cervical cancer  maternal grandmother diabetes type 2 Maternal uncle diabetes type 2  ALLERGIES:  has No Known Allergies.  MEDICATIONS:  Current Outpatient Medications  Medication Sig Dispense Refill   acetaminophen (TYLENOL) 500 MG tablet Take 1,500 mg by mouth daily as needed for moderate pain.     Calcium Carb-Cholecalciferol (CALCIUM 600+D) 600-20 MG-MCG TABS Take 1 tablet by mouth daily.     Cholecalciferol (VITAMIN D3) 25 MCG (1000 UT) CAPS Take 1,000 Units by mouth daily.     eltrombopag (PROMACTA) 25 MG tablet TAKE 1 TABLET (25 MG TOTAL) BY MOUTH DAILY. TAKE ON AN EMPTY STOMACH 1 HR BEFORE OR 2 HR AFTER FOOD. SEPARATE FROM IRON SUPPLEMENT. 30 tablet 11   ferrous sulfate 325 (65 FE) MG tablet Take 325 mg by mouth 2 (two) times daily.     hydrochlorothiazide (HYDRODIURIL) 25 MG tablet Take 25 mg by mouth daily.     levothyroxine (SYNTHROID) 88 MCG tablet Take 88 mcg by mouth daily before breakfast.     Multiple Vitamin (MULTIVITAMIN) tablet Take 1 tablet by mouth daily.     PEG-KCl-NaCl-NaSulf-Na Asc-C (PLENVU) 140 g SOLR Split dose prep following the doctor instruction 1 each 0   Turmeric 500 MG TABS Take 500 mg by mouth daily.     vitamin B-12 (CYANOCOBALAMIN) 1000 MCG tablet Take 1,000 mcg by mouth daily.     No current facility-administered medications for this visit.    REVIEW OF SYSTEMS:   10 Point review of Systems was done is negative except as noted above.  PHYSICAL EXAMINATION: Telemedicine visit  LABORATORY DATA:  I have reviewed the data  as listed  .    Latest Ref Rng & Units 04/08/2022    9:21 AM 03/09/2022    8:35 AM 12/07/2021    9:14 AM  CBC  WBC 4.0 - 10.5 K/uL 9.3  9.2  9.5   Hemoglobin 12.0 - 15.0 g/dL 12.6  12.9  13.4   Hematocrit 36.0 - 46.0 % 37.4  37.3  39.3   Platelets 150 - 400 K/uL 239  260  167        Latest Ref Rng & Units 04/08/2022    9:21 AM 03/09/2022    8:35 AM 12/07/2021    9:14 AM  CMP  Glucose 70 - 99 mg/dL 174  213  266   BUN 6 - 20 mg/dL '8  9  12   '$ Creatinine 0.44 - 1.00 mg/dL 0.60  0.65  0.75   Sodium 135 - 145 mmol/L 140  137  137   Potassium 3.5 - 5.1 mmol/L  4.1  4.1  4.3   Chloride 98 - 111 mmol/L 103  102  103   CO2 22 - 32 mmol/L '30  29  26   '$ Calcium 8.9 - 10.3 mg/dL 9.1  9.2  8.6   Total Protein 6.5 - 8.1 g/dL 7.3  7.2  7.3   Total Bilirubin 0.3 - 1.2 mg/dL 0.6  0.6  0.6   Alkaline Phos 38 - 126 U/L 74  73  86   AST 15 - 41 U/L '13  15  16   '$ ALT 0 - 44 U/L '11  15  19    '$ . Lab Results  Component Value Date   IRON 58 04/08/2022   TIBC 434 04/08/2022   IRONPCTSAT 13 04/08/2022   (Iron and TIBC)  Lab Results  Component Value Date   FERRITIN 49 04/08/2022    RADIOGRAPHIC STUDIES: I have personally reviewed the radiological images as listed and agreed with the findings in the report. No results found.  ASSESSMENT & PLAN:   46 y.o. African-American female with   1) Chronic ITP since 2012 now with acute relapse of her ITP with very labile platelet counts. No overt bleeding at this time. Patient has been on and off steroids and has been on Promacta since 2016. Was apparently on Promacta '25mg'$  po MWF. No issues with bleeding at this time. No petechiae B12 wnl. Ultrasound abdomen 06/02/2016- did show some mild splenomegaly which could be an additional factor in her thrombocytopenia. The spleen measures 12.9 x 13.9 x 6.7 cm. The calculated volume is 628 cc. Unclear etiology of splenomegaly. Liver appeared normal.  Patient's platelet counts have normalized after treatment  with Rituxan weekly 4 doses with platelets improved temporarily but she needed additional treatment.  PLAN:  -Patient's latest labs were discussed in detail with her CBC is within normal limits with WBC count of 9.3k hemoglobin of 12.6 and platelets of 239k CMP stable Iron labs WNL and otherwise noted as written below. Patient with no bleeding issues, no leg swelling, no chest pain or shortness of breath. Patient notes no notable toxicities from Promacta We reduce her Promacta from 6 days a week to 5 days a week (mon through Friday.  2) Iron deficiency Anemia - likely from heavy menstrual losses. -Tolerating the po iron without any significant issues.  Iron/TIBC/Ferritin/ %Sat    Component Value Date/Time   IRON 58 04/08/2022 0921   IRON 54 04/15/2017 0835   TIBC 434 04/08/2022 0921   TIBC 411 04/15/2017 0835   FERRITIN 49 04/08/2022 0921   FERRITIN 43 04/15/2017 0835   IRONPCTSAT 13 04/08/2022 0921   IRONPCTSAT 13 (L) 04/15/2017 0835    Plan  -Continue iron polysaccharide 150 mg.  P.o. daily.  3)  Hypothyroidism - on levothyroxine As per primary care physician -continue Mx per PCP  4) hyperglycemia-likely diabetes -Patient recommended to follow-up with primary care physician for further evaluation and management -Recommended lifestyle changes to focus on appropriate diet and weight loss  4) . Patient Active Problem List   Diagnosis Date Noted   Personal history of colonic polyps    Hx of adenomatous colonic polyps    Menorrhagia with irregular cycle 12/04/2020   Body mass index (BMI) of 50.0 to 59.9 in adult (HCC)    BRBPR (bright red blood per rectum)    Polyp of descending colon    Encntr for gyn exam (general) (routine) w/o abn findings 10/13/2016   Exposure to STD 10/13/2016   Chronic  ITP (idiopathic thrombocytopenia) (HCC) 05/03/2016   Thrombocytopenia ([place]) 04/19/2016   Iron deficiency anemia 04/19/2016   Hypothyroidism 04/19/2016   Degenerative arthritis  04/19/2016   Generalized anxiety disorder 04/19/2016  -continue f/u with PCP  5) H/o PE in 2004-- still has IVC filter in situ. Thought to be triggered by immobility and obesity. -not on anticoagulation at this time.  FOLLOW UP: Phone visit with Dr Irene Limbo in 6 weeks Labs 1 day prior to phone visit  The total time spent in the appointment was 15 minutes*.  All of the patient's questions were answered with apparent satisfaction. The patient knows to call the clinic with any problems, questions or concerns.   Sullivan Lone MD MS AAHIVMS [facility] Hematology/Oncology Physician [facility]  .*Total Encounter Time as defined by the Centers for Medicare and Medicaid Services includes, in addition to the face-to-face time of a patient visit (documented in the note above) non-face-to-face time: obtaining and reviewing outside history, ordering and reviewing medications, tests or procedures, care coordination (communications with other health care professionals or caregivers) and documentation in the medical record.  I, Melene Muller, am acting as scribe for Dr. Sullivan Lone, MD.  .I have reviewed the above documentation for accuracy and completeness, and I agree with the above. Brunetta Genera MD

## 2022-04-15 MED ORDER — ELTROMBOPAG OLAMINE 25 MG PO TABS
ORAL_TABLET | ORAL | 11 refills | Status: DC
  Filled 2022-04-15: qty 30, fill #0
  Filled 2022-05-11: qty 30, 42d supply, fill #0
  Filled 2022-06-28: qty 30, 42d supply, fill #1
  Filled 2022-08-04: qty 30, 42d supply, fill #2
  Filled 2022-09-20: qty 30, 42d supply, fill #3

## 2022-04-16 ENCOUNTER — Other Ambulatory Visit (HOSPITAL_COMMUNITY): Payer: Self-pay

## 2022-04-23 ENCOUNTER — Telehealth: Payer: Self-pay | Admitting: Hematology

## 2022-04-23 NOTE — Telephone Encounter (Signed)
Left message with follow-up appointments per 9/22 los.

## 2022-04-27 ENCOUNTER — Other Ambulatory Visit (HOSPITAL_COMMUNITY): Payer: Self-pay

## 2022-04-29 ENCOUNTER — Other Ambulatory Visit (HOSPITAL_COMMUNITY): Payer: Self-pay

## 2022-05-03 ENCOUNTER — Other Ambulatory Visit (HOSPITAL_COMMUNITY): Payer: Self-pay

## 2022-05-11 ENCOUNTER — Other Ambulatory Visit (HOSPITAL_COMMUNITY): Payer: Self-pay

## 2022-05-17 ENCOUNTER — Other Ambulatory Visit: Payer: Self-pay

## 2022-05-17 DIAGNOSIS — D693 Immune thrombocytopenic purpura: Secondary | ICD-10-CM

## 2022-05-18 ENCOUNTER — Inpatient Hospital Stay: Payer: Medicaid Other | Attending: Hematology

## 2022-05-18 DIAGNOSIS — Z79899 Other long term (current) drug therapy: Secondary | ICD-10-CM | POA: Diagnosis not present

## 2022-05-18 DIAGNOSIS — D693 Immune thrombocytopenic purpura: Secondary | ICD-10-CM | POA: Insufficient documentation

## 2022-05-18 LAB — CBC WITH DIFFERENTIAL (CANCER CENTER ONLY)
Abs Immature Granulocytes: 0.08 10*3/uL — ABNORMAL HIGH (ref 0.00–0.07)
Basophils Absolute: 0 10*3/uL (ref 0.0–0.1)
Basophils Relative: 1 %
Eosinophils Absolute: 0.2 10*3/uL (ref 0.0–0.5)
Eosinophils Relative: 2 %
HCT: 36.6 % (ref 36.0–46.0)
Hemoglobin: 12.4 g/dL (ref 12.0–15.0)
Immature Granulocytes: 1 %
Lymphocytes Relative: 18 %
Lymphs Abs: 1.5 10*3/uL (ref 0.7–4.0)
MCH: 26.7 pg (ref 26.0–34.0)
MCHC: 33.9 g/dL (ref 30.0–36.0)
MCV: 78.9 fL — ABNORMAL LOW (ref 80.0–100.0)
Monocytes Absolute: 0.6 10*3/uL (ref 0.1–1.0)
Monocytes Relative: 8 %
Neutro Abs: 5.8 10*3/uL (ref 1.7–7.7)
Neutrophils Relative %: 70 %
Platelet Count: 162 10*3/uL (ref 150–400)
RBC: 4.64 MIL/uL (ref 3.87–5.11)
RDW: 13.8 % (ref 11.5–15.5)
WBC Count: 8.2 10*3/uL (ref 4.0–10.5)
nRBC: 0 % (ref 0.0–0.2)

## 2022-05-18 LAB — FERRITIN: Ferritin: 44 ng/mL (ref 11–307)

## 2022-05-18 LAB — CMP (CANCER CENTER ONLY)
ALT: 18 U/L (ref 0–44)
AST: 17 U/L (ref 15–41)
Albumin: 3.9 g/dL (ref 3.5–5.0)
Alkaline Phosphatase: 77 U/L (ref 38–126)
Anion gap: 8 (ref 5–15)
BUN: 11 mg/dL (ref 6–20)
CO2: 30 mmol/L (ref 22–32)
Calcium: 9 mg/dL (ref 8.9–10.3)
Chloride: 102 mmol/L (ref 98–111)
Creatinine: 0.65 mg/dL (ref 0.44–1.00)
GFR, Estimated: >60 mL/min (ref >60)
Glucose, Bld: 209 mg/dL — ABNORMAL HIGH (ref 70–99)
Potassium: 3.8 mmol/L (ref 3.5–5.1)
Sodium: 140 mmol/L (ref 135–145)
Total Bilirubin: 0.5 mg/dL (ref 0.3–1.2)
Total Protein: 7.7 g/dL (ref 6.5–8.1)

## 2022-05-18 LAB — IRON AND IRON BINDING CAPACITY (CC-WL,HP ONLY)
Iron: 54 ug/dL (ref 28–170)
Saturation Ratios: 12 % (ref 10.4–31.8)
TIBC: 440 ug/dL (ref 250–450)
UIBC: 386 ug/dL (ref 148–442)

## 2022-05-19 ENCOUNTER — Inpatient Hospital Stay: Payer: Medicaid Other | Attending: Hematology | Admitting: Hematology

## 2022-05-19 DIAGNOSIS — N920 Excessive and frequent menstruation with regular cycle: Secondary | ICD-10-CM | POA: Insufficient documentation

## 2022-05-19 DIAGNOSIS — F419 Anxiety disorder, unspecified: Secondary | ICD-10-CM | POA: Insufficient documentation

## 2022-05-19 DIAGNOSIS — M1990 Unspecified osteoarthritis, unspecified site: Secondary | ICD-10-CM | POA: Insufficient documentation

## 2022-05-19 DIAGNOSIS — D500 Iron deficiency anemia secondary to blood loss (chronic): Secondary | ICD-10-CM | POA: Insufficient documentation

## 2022-05-19 DIAGNOSIS — Z86711 Personal history of pulmonary embolism: Secondary | ICD-10-CM | POA: Diagnosis not present

## 2022-05-19 DIAGNOSIS — Z833 Family history of diabetes mellitus: Secondary | ICD-10-CM | POA: Diagnosis not present

## 2022-05-19 DIAGNOSIS — R739 Hyperglycemia, unspecified: Secondary | ICD-10-CM | POA: Diagnosis not present

## 2022-05-19 DIAGNOSIS — Z8049 Family history of malignant neoplasm of other genital organs: Secondary | ICD-10-CM | POA: Insufficient documentation

## 2022-05-19 DIAGNOSIS — E039 Hypothyroidism, unspecified: Secondary | ICD-10-CM | POA: Diagnosis not present

## 2022-05-19 DIAGNOSIS — Z79890 Hormone replacement therapy (postmenopausal): Secondary | ICD-10-CM | POA: Insufficient documentation

## 2022-05-19 DIAGNOSIS — D693 Immune thrombocytopenic purpura: Secondary | ICD-10-CM | POA: Insufficient documentation

## 2022-05-19 DIAGNOSIS — D509 Iron deficiency anemia, unspecified: Secondary | ICD-10-CM

## 2022-05-19 DIAGNOSIS — Z79899 Other long term (current) drug therapy: Secondary | ICD-10-CM | POA: Insufficient documentation

## 2022-05-19 DIAGNOSIS — G43909 Migraine, unspecified, not intractable, without status migrainosus: Secondary | ICD-10-CM | POA: Insufficient documentation

## 2022-05-20 ENCOUNTER — Other Ambulatory Visit (HOSPITAL_COMMUNITY): Payer: Self-pay

## 2022-05-25 NOTE — Progress Notes (Signed)
HEMATOLOGY/ONCOLOGY PHONE VISIT NOTE  Date of Service: .05/19/2022   PCP: .Lin Landsman, MD  CHIEF COMPLAINTS/PURPOSE OF CONSULTATION:  Follow-up for continued evaluation and management of chronic ITP  HISTORY OF PRESENTING ILLNESS:   Please see previous note for details of initial presentation  INTERVAL HISTORY: I connected with Vlasta Swinton [place] on 05/19/2022 at  8:40 AM EDT by telephone visit and verified that I am speaking with the correct person using two identifiers.   I discussed the limitations, risks, security and privacy concerns of performing an evaluation and management service by telemedicine and the availability of in-person appointments. I also discussed with the patient that there may be a patient responsible charge related to this service. The patient expressed understanding and agreed to proceed.   Other persons participating in the visit and their role in the encounter: none   Patient's location: home  Provider's location: [facility]   Chief Complaint: Follow-up for continued evaluation and management of chronic ITP and review of labs  Woodway is a 46 y.o. female who was called for continued evaluation and management of her chronic ITP and to review her labs from 05/18/2022.  She notes no acute new symptoms since her last clinic visit.  No issues with abnormal bruising or bleeding. No issues with tolerating her Promacta at this time.  No new leg pain or swelling.  No new abdominal pain or distention.  No new chest pain or shortness of breath..  Labs done yesterday were discussed in detail with her.  MEDICAL HISTORY:  #1 chronic ITP #2 Iron deficiency anemia due to heavy periods #3 hypothyroidism #4 degenerative arthritis #5 anxiety disorder #6 history of pulmonary embolism in 2004 status post IVC filter placement. 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 #7 migraine headaches  SURGICAL HISTORY: Past Surgical History:  Procedure Laterality Date   BREAST BIOPSY Left    Pt believes she had lymphnode biopsy   C secton  2002   COLONOSCOPY     COLONOSCOPY WITH PROPOFOL N/A 02/07/2018   Procedure: COLONOSCOPY WITH PROPOFOL;  Surgeon: Mauri Pole, MD;  [facility]: [facility];  Service: Endoscopy;  Laterality: N/A;   COLONOSCOPY WITH PROPOFOL N/A 03/31/2022   Procedure: COLONOSCOPY WITH PROPOFOL;  Surgeon: Mauri Pole, MD;  Location: [facility];  Service: Gastroenterology;  Laterality: N/A;   IVC FILTER PLACEMENT ([place] HX)  2004   Left axillary lymph node excision biopsy  2008   POLYPECTOMY  02/07/2018   Procedure: POLYPECTOMY;  Surgeon: Mauri Pole, MD;  [facility];  Service: Endoscopy;;  Clips Placed    SOCIAL HISTORY: Social History   Socioeconomic History   Marital status: Single    Spouse name: Not on file   Number of children: Not on file   Years of education: Not on file   Highest education level: Not on file  Occupational History   Not on file  Tobacco Use   Smoking status: Never    Passive exposure: Past (lived with aunt who smoked in early 2000's, for about one year)   Smokeless tobacco: Never  Vaping Use   Vaping Use: Never used  Substance and Sexual Activity   Alcohol use: Yes    Comment: occasional: 1-2x per times per year one drink   Drug use: Not Currently    Types: Marijuana    Comment: expirimented  as a teen   Sexual activity: Yes    Partners: Male    Birth control/protection: None  Other Topics Concern   Not on file  Social History Narrative   Not on file   Social Determinants of Health   Financial Resource Strain: Not on file  Food Insecurity: Not on file  Transportation Needs: Not on file  Physical Activity: Not on file  Stress: Not on file  Social Connections: Not on file  Intimate Partner Violence: Not on file  Recently moved from  [place], [place] to [place]. Nonsmoker minimal social alcohol use no drug use Currently unemployed Has a 22 year old daughter  FAMILY HISTORY: No known history of blood disorders Mother had a history of cervical cancer  maternal grandmother diabetes type 2 Maternal uncle diabetes type 2  ALLERGIES:  has No Known Allergies.  MEDICATIONS:  Current Outpatient Medications  Medication Sig Dispense Refill   acetaminophen (TYLENOL) 500 MG tablet Take 1,500 mg by mouth daily as needed for moderate pain.     Calcium Carb-Cholecalciferol (CALCIUM 600+D) 600-20 MG-MCG TABS Take 1 tablet by mouth daily.     Cholecalciferol (VITAMIN D3) 25 MCG (1000 UT) CAPS Take 1,000 Units by mouth daily.     eltrombopag (PROMACTA) 25 MG tablet TAKE 1 TABLET (25 MG TOTAL) BY MOUTH DAILY. TAKE ON AN EMPTY STOMACH 1 HR BEFORE OR 2 HR AFTER FOOD. SEPARATE FROM IRON SUPPLEMENT. 30 tablet 11   ferrous sulfate 325 (65 FE) MG tablet Take 325 mg by mouth 2 (two) times daily.     hydrochlorothiazide (HYDRODIURIL) 25 MG tablet Take 25 mg by mouth daily.     levothyroxine (SYNTHROID) 88 MCG tablet Take 88 mcg by mouth daily before breakfast.     Multiple Vitamin (MULTIVITAMIN) tablet Take 1 tablet by mouth daily.     PEG-KCl-NaCl-NaSulf-Na Asc-C (PLENVU) 140 g SOLR Split dose prep following the doctor instruction 1 each 0   Turmeric 500 MG TABS Take 500 mg by mouth daily.     vitamin B-12 (CYANOCOBALAMIN) 1000 MCG tablet Take 1,000 mcg by mouth daily.     No current facility-administered medications for this visit.    REVIEW OF SYSTEMS:   10 Point review of Systems was done is negative except as noted above.  PHYSICAL EXAMINATION: Telemedicine visit  LABORATORY DATA:  I have reviewed the data as listed  .    Latest Ref Rng & Units 05/18/2022    9:34 AM 04/08/2022    9:21 AM 03/09/2022    8:35 AM  CBC  WBC 4.0 - 10.5 K/uL 8.2  9.3  9.2   Hemoglobin 12.0 - 15.0 g/dL 12.4  12.6  12.9   Hematocrit 36.0 - 46.0  % 36.6  37.4  37.3   Platelets 150 - 400 K/uL 162  239  260        Latest Ref Rng & Units 05/18/2022    9:34 AM 04/08/2022    9:21 AM 03/09/2022    8:35 AM  CMP  Glucose 70 - 99 mg/dL 209  174  213   BUN 6 - 20 mg/dL '11  8  9   '$ Creatinine 0.44 - 1.00 mg/dL 0.65  0.60  0.65   Sodium 135 - 145 mmol/L 140  140  137   Potassium 3.5 - 5.1 mmol/L 3.8  4.1  4.1   Chloride 98 - 111 mmol/L 102  103  102   CO2 22 - 32 mmol/L 30  30  29  Calcium 8.9 - 10.3 mg/dL 9.0  9.1  9.2   Total Protein 6.5 - 8.1 g/dL 7.7  7.3  7.2   Total Bilirubin 0.3 - 1.2 mg/dL 0.5  0.6  0.6   Alkaline Phos 38 - 126 U/L 77  74  73   AST 15 - 41 U/L '17  13  15   '$ ALT 0 - 44 U/L '18  11  15    '$ . Lab Results  Component Value Date   IRON 54 05/18/2022   TIBC 440 05/18/2022   IRONPCTSAT 12 05/18/2022  . (Iron and TIBC)  Lab Results  Component Value Date   FERRITIN 44 05/18/2022    RADIOGRAPHIC STUDIES: I have personally reviewed the radiological images as listed and agreed with the findings in the report. No results found.  ASSESSMENT & PLAN:   46 y.o. African-American female with   1) Chronic ITP since 2012 now with acute relapse of her ITP with very labile platelet counts. No overt bleeding at this time. Patient has been on and off steroids and has been on Promacta since 2016. Was apparently on Promacta '25mg'$  po MWF. No issues with bleeding at this time. No petechiae B12 wnl. Ultrasound abdomen 06/02/2016- did show some mild splenomegaly which could be an additional factor in her thrombocytopenia. The spleen measures 12.9 x 13.9 x 6.7 cm. The calculated volume is 628 cc. Unclear etiology of splenomegaly. Liver appeared normal.  Patient's platelet counts have normalized after treatment with Rituxan weekly 4 doses with platelets improved temporarily but she needed additional treatment.  PLAN:  -Patient's labs from 05/18/2022 were discussed in detail with her . CBC shows stable hemoglobin of 11.4 with a  normal WBC count of 8.2k and platelets of 162k. CMP stable other than a blood sugar of 209 Patient reports no issues with bleeding or abnormal bruising.  No new leg swelling or chest pain. No intolerance or adverse effects from the current dose of Promacta. She will continue Promacta at 25 mg p.o. daily for 5 days a week from Monday through Friday. -We discussed that she should talk to her primary care physician about getting a fasting blood sugar and a hemoglobin A1c to rule out diabetes.   2) Iron deficiency Anemia - likely from heavy menstrual losses. -Tolerating the po iron without any significant issues.  Iron/TIBC/Ferritin/ %Sat    Component Value Date/Time   IRON 58 04/08/2022 0921   IRON 54 04/15/2017 0835   TIBC 434 04/08/2022 0921   TIBC 411 04/15/2017 0835   FERRITIN 49 04/08/2022 0921   FERRITIN 43 04/15/2017 0835   IRONPCTSAT 13 04/08/2022 0921   IRONPCTSAT 13 (L) 04/15/2017 0835    Plan  -Continue iron polysaccharide 150 mg.  P.o. daily.  3)  Hypothyroidism - on levothyroxine As per primary care physician -continue Mx per PCP  4) hyperglycemia-likely diabetes -Patient recommended to follow-up with primary care physician for further evaluation and management -Recommended lifestyle changes to focus on appropriate diet and weight loss  4) . Patient Active Problem List   Diagnosis Date Noted   Personal history of colonic polyps    Hx of adenomatous colonic polyps    Menorrhagia with irregular cycle 12/04/2020   Body mass index (BMI) of 50.0 to 59.9 in adult (HCC)    BRBPR (bright red blood per rectum)    Polyp of descending colon    Encntr for gyn exam (general) (routine) w/o abn findings 10/13/2016   Exposure to STD 10/13/2016  Chronic ITP (idiopathic thrombocytopenia) (HCC) 05/03/2016   Thrombocytopenia ([place]) 04/19/2016   Iron deficiency anemia 04/19/2016   Hypothyroidism 04/19/2016   Degenerative arthritis 04/19/2016   Generalized anxiety disorder  04/19/2016  -continue f/u with PCP  5) H/o PE in 2004-- still has IVC filter in situ. Thought to be triggered by immobility and obesity. -not on anticoagulation at this time.  FOLLOW UP: Phone visit with Dr. Irene Limbo in 3 months Labs 1 day prior to phone visit  The total time spent in the appointment was 15 minutes*.  All of the patient's questions were answered with apparent satisfaction. The patient knows to call the clinic with any problems, questions or concerns.   Sullivan Lone MD MS AAHIVMS [facility] Hematology/Oncology Physician [facility]  .*Total Encounter Time as defined by the Centers for Medicare and Medicaid Services includes, in addition to the face-to-face time of a patient visit (documented in the note above) non-face-to-face time: obtaining and reviewing outside history, ordering and reviewing medications, tests or procedures, care coordination (communications with other health care professionals or caregivers) and documentation in the medical record.

## 2022-05-26 ENCOUNTER — Telehealth: Payer: Self-pay | Admitting: Hematology

## 2022-05-26 NOTE — Telephone Encounter (Signed)
Called patient per 11/7 in basket. Patient notified of upcoming appointments.

## 2022-06-29 ENCOUNTER — Other Ambulatory Visit (HOSPITAL_COMMUNITY): Payer: Self-pay

## 2022-07-23 ENCOUNTER — Other Ambulatory Visit (HOSPITAL_COMMUNITY): Payer: Self-pay

## 2022-08-04 ENCOUNTER — Other Ambulatory Visit (HOSPITAL_COMMUNITY): Payer: Self-pay

## 2022-08-04 ENCOUNTER — Other Ambulatory Visit: Payer: Self-pay | Admitting: Family Medicine

## 2022-08-04 DIAGNOSIS — R928 Other abnormal and inconclusive findings on diagnostic imaging of breast: Secondary | ICD-10-CM

## 2022-08-06 ENCOUNTER — Other Ambulatory Visit: Payer: Self-pay

## 2022-08-16 ENCOUNTER — Other Ambulatory Visit: Payer: Self-pay

## 2022-08-20 ENCOUNTER — Other Ambulatory Visit: Payer: Self-pay

## 2022-08-20 DIAGNOSIS — D693 Immune thrombocytopenic purpura: Secondary | ICD-10-CM

## 2022-08-23 ENCOUNTER — Inpatient Hospital Stay: Payer: Medicaid Other | Attending: Hematology

## 2022-08-23 DIAGNOSIS — M1990 Unspecified osteoarthritis, unspecified site: Secondary | ICD-10-CM | POA: Diagnosis not present

## 2022-08-23 DIAGNOSIS — Z8049 Family history of malignant neoplasm of other genital organs: Secondary | ICD-10-CM | POA: Diagnosis not present

## 2022-08-23 DIAGNOSIS — D693 Immune thrombocytopenic purpura: Secondary | ICD-10-CM | POA: Insufficient documentation

## 2022-08-23 DIAGNOSIS — Z79890 Hormone replacement therapy (postmenopausal): Secondary | ICD-10-CM | POA: Insufficient documentation

## 2022-08-23 DIAGNOSIS — G43909 Migraine, unspecified, not intractable, without status migrainosus: Secondary | ICD-10-CM | POA: Insufficient documentation

## 2022-08-23 DIAGNOSIS — Z833 Family history of diabetes mellitus: Secondary | ICD-10-CM | POA: Diagnosis not present

## 2022-08-23 DIAGNOSIS — D500 Iron deficiency anemia secondary to blood loss (chronic): Secondary | ICD-10-CM | POA: Diagnosis not present

## 2022-08-23 DIAGNOSIS — Z86711 Personal history of pulmonary embolism: Secondary | ICD-10-CM | POA: Diagnosis not present

## 2022-08-23 DIAGNOSIS — Z86010 Personal history of colonic polyps: Secondary | ICD-10-CM | POA: Insufficient documentation

## 2022-08-23 DIAGNOSIS — N920 Excessive and frequent menstruation with regular cycle: Secondary | ICD-10-CM | POA: Diagnosis not present

## 2022-08-23 DIAGNOSIS — R739 Hyperglycemia, unspecified: Secondary | ICD-10-CM | POA: Diagnosis not present

## 2022-08-23 DIAGNOSIS — Z79899 Other long term (current) drug therapy: Secondary | ICD-10-CM | POA: Diagnosis not present

## 2022-08-23 DIAGNOSIS — Z7289 Other problems related to lifestyle: Secondary | ICD-10-CM | POA: Insufficient documentation

## 2022-08-23 DIAGNOSIS — E039 Hypothyroidism, unspecified: Secondary | ICD-10-CM | POA: Insufficient documentation

## 2022-08-23 LAB — CBC WITH DIFFERENTIAL (CANCER CENTER ONLY)
Abs Immature Granulocytes: 0.14 10*3/uL — ABNORMAL HIGH (ref 0.00–0.07)
Basophils Absolute: 0 10*3/uL (ref 0.0–0.1)
Basophils Relative: 0 %
Eosinophils Absolute: 0.2 10*3/uL (ref 0.0–0.5)
Eosinophils Relative: 3 %
HCT: 33.9 % — ABNORMAL LOW (ref 36.0–46.0)
Hemoglobin: 11.5 g/dL — ABNORMAL LOW (ref 12.0–15.0)
Immature Granulocytes: 2 %
Lymphocytes Relative: 15 %
Lymphs Abs: 1.3 10*3/uL (ref 0.7–4.0)
MCH: 26.9 pg (ref 26.0–34.0)
MCHC: 33.9 g/dL (ref 30.0–36.0)
MCV: 79.2 fL — ABNORMAL LOW (ref 80.0–100.0)
Monocytes Absolute: 0.5 10*3/uL (ref 0.1–1.0)
Monocytes Relative: 6 %
Neutro Abs: 6.3 10*3/uL (ref 1.7–7.7)
Neutrophils Relative %: 74 %
Platelet Count: 263 10*3/uL (ref 150–400)
RBC: 4.28 MIL/uL (ref 3.87–5.11)
RDW: 13.2 % (ref 11.5–15.5)
WBC Count: 8.5 10*3/uL (ref 4.0–10.5)
nRBC: 0 % (ref 0.0–0.2)

## 2022-08-23 LAB — CMP (CANCER CENTER ONLY)
ALT: 18 U/L (ref 0–44)
AST: 18 U/L (ref 15–41)
Albumin: 3.5 g/dL (ref 3.5–5.0)
Alkaline Phosphatase: 77 U/L (ref 38–126)
Anion gap: 8 (ref 5–15)
BUN: 11 mg/dL (ref 6–20)
CO2: 27 mmol/L (ref 22–32)
Calcium: 8.7 mg/dL — ABNORMAL LOW (ref 8.9–10.3)
Chloride: 103 mmol/L (ref 98–111)
Creatinine: 0.6 mg/dL (ref 0.44–1.00)
GFR, Estimated: >60 mL/min (ref >60)
Glucose, Bld: 200 mg/dL — ABNORMAL HIGH (ref 70–99)
Potassium: 3.7 mmol/L (ref 3.5–5.1)
Sodium: 138 mmol/L (ref 135–145)
Total Bilirubin: 0.5 mg/dL (ref 0.3–1.2)
Total Protein: 6.4 g/dL — ABNORMAL LOW (ref 6.5–8.1)

## 2022-08-23 LAB — IRON AND IRON BINDING CAPACITY (CC-WL,HP ONLY)
Iron: 61 ug/dL (ref 28–170)
Saturation Ratios: 15 % (ref 10.4–31.8)
TIBC: 409 ug/dL (ref 250–450)
UIBC: 348 ug/dL (ref 148–442)

## 2022-08-23 LAB — FERRITIN: Ferritin: 41 ng/mL (ref 11–307)

## 2022-08-24 ENCOUNTER — Inpatient Hospital Stay (HOSPITAL_BASED_OUTPATIENT_CLINIC_OR_DEPARTMENT_OTHER): Payer: Medicaid Other | Admitting: Hematology

## 2022-08-24 DIAGNOSIS — D693 Immune thrombocytopenic purpura: Secondary | ICD-10-CM | POA: Diagnosis not present

## 2022-08-24 DIAGNOSIS — D509 Iron deficiency anemia, unspecified: Secondary | ICD-10-CM

## 2022-08-24 NOTE — Progress Notes (Signed)
HEMATOLOGY/ONCOLOGY PHONE VISIT NOTE  Date of Service: 08/24/22   PCP: .Lin Landsman, MD  CHIEF COMPLAINTS/PURPOSE OF CONSULTATION:  Follow-up for continued evaluation and management of chronic ITP  HISTORY OF PRESENTING ILLNESS:   Please see previous note for details of initial presentation  INTERVAL HISTORY: Kiara Barrera is a 47 y.o. female who was connected via phone for continued evaluation and management of her chronic ITP. I connected with Kiara Barrera on 08/24/2022 at  8:40 AM EST by telephone visit and verified that I am speaking with the correct person using two identifiers.  Patient was last seen by me on 05/19/2022 and she was doing well overall. She denied any toxicities with Promacta. During our last visit, her lab results showed elevated blood sugar of 209 and we recommended to follow-up with PCP regarding possible diabetes.   Patient reports she has been doing well overall without any new medical concerns. She denies any abnormal bleeding except heavy menstrual period that lasted around 2 months. She denies fever, chills, night sweats, unexpected weight loss, leg pain, back pain, chest pain, or leg swelling.   Patient notes that she has not been to her PCP regarding elevated blood sugar level which could be possible diabetes.   She is regularly taking Promacta 5 days a week without any toxicities.   Labs done on 08/23/2022 were discussed in detail with her.   I discussed the limitations, risks, security and privacy concerns of performing an evaluation and management service by telemedicine and the availability of in-person appointments. I also discussed with the patient that there may be a patient responsible charge related to this service. The patient expressed understanding and agreed to proceed.   Other persons participating in the visit and their role in the encounter: none   Patient's location: home  Provider's location: [facility]    Chief Complaint: Follow-up for continued evaluation and management of chronic ITP and review of labs    MEDICAL HISTORY:  #1 chronic ITP #2 Iron deficiency anemia due to heavy periods #3 hypothyroidism #4 degenerative arthritis #5 anxiety disorder #6 history of pulmonary embolism in 2004 status post IVC filter placement. 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 #7 migraine headaches  SURGICAL HISTORY: Past Surgical History:  Procedure Laterality Date   BREAST BIOPSY Left    Pt believes she had lymphnode biopsy   C secton  2002   COLONOSCOPY     COLONOSCOPY WITH PROPOFOL N/A 02/07/2018   Procedure: COLONOSCOPY WITH PROPOFOL;  Surgeon: Mauri Pole, MD;  Location: [facility];  Service: Endoscopy;  Laterality: N/A;   COLONOSCOPY WITH PROPOFOL N/A 03/31/2022   Procedure: COLONOSCOPY WITH PROPOFOL;  Surgeon: Mauri Pole, MD;  Location: [facility];  Service: Gastroenterology;  Laterality: N/A;   IVC FILTER PLACEMENT ([place] HX)  2004   Left axillary lymph node excision biopsy  2008   POLYPECTOMY  02/07/2018   Procedure: POLYPECTOMY;  Surgeon: Mauri Pole, MD;  Location: [facility];  Service: Endoscopy;;  Clips Placed    SOCIAL HISTORY: Social History   Socioeconomic History   Marital status: Single    Spouse name: Not on file   Number of children: Not on file   Years of education: Not on file   Highest education level: Not on file  Occupational History   Not on file  Tobacco Use  Smoking status: Never    Passive exposure: Past (lived with aunt who smoked in early 2000's, for about one year)   Smokeless tobacco: Never  Vaping Use   Vaping Use: Never used  Substance and Sexual Activity   Alcohol use: Yes    Comment: occasional: 1-2x per times per year one drink   Drug use: Not Currently    Types: Marijuana    Comment: expirimented as a teen    Sexual activity: Yes    Partners: Male    Birth control/protection: None  Other Topics Concern   Not on file  Social History Narrative   Not on file   Social Determinants of Health   Financial Resource Strain: Not on file  Food Insecurity: Not on file  Transportation Needs: Not on file  Physical Activity: Not on file  Stress: Not on file  Social Connections: Not on file  Intimate Partner Violence: Not on file  Recently moved from [place], [place] to [place]. Nonsmoker minimal social alcohol use no drug use Currently unemployed Has a 34 year old daughter  FAMILY HISTORY: No known history of blood disorders Mother had a history of cervical cancer  maternal grandmother diabetes type 2 Maternal uncle diabetes type 2  ALLERGIES:  has No Known Allergies.  MEDICATIONS:  Current Outpatient Medications  Medication Sig Dispense Refill   acetaminophen (TYLENOL) 500 MG tablet Take 1,500 mg by mouth daily as needed for moderate pain.     Calcium Carb-Cholecalciferol (CALCIUM 600+D) 600-20 MG-MCG TABS Take 1 tablet by mouth daily.     Cholecalciferol (VITAMIN D3) 25 MCG (1000 UT) CAPS Take 1,000 Units by mouth daily.     eltrombopag (PROMACTA) 25 MG tablet Take 1 tablet (72m) by mouth daily from Monday through Friday (5 days a week) 30 tablet 11   ferrous sulfate 325 (65 FE) MG tablet Take 325 mg by mouth 2 (two) times daily.     hydrochlorothiazide (HYDRODIURIL) 25 MG tablet Take 25 mg by mouth daily.     levothyroxine (SYNTHROID) 88 MCG tablet Take 88 mcg by mouth daily before breakfast.     Multiple Vitamin (MULTIVITAMIN) tablet Take 1 tablet by mouth daily.     PEG-KCl-NaCl-NaSulf-Na Asc-C (PLENVU) 140 g SOLR Split dose prep following the doctor instruction 1 each 0   Turmeric 500 MG TABS Take 500 mg by mouth daily.     vitamin B-12 (CYANOCOBALAMIN) 1000 MCG tablet Take 1,000 mcg by mouth daily.     No current facility-administered medications for this visit.    REVIEW OF  SYSTEMS:   10 Point review of Systems was done is negative except as noted above.  PHYSICAL EXAMINATION: Telemedicine visit  LABORATORY DATA:  I have reviewed the data as listed  .    Latest Ref Rng & Units 08/23/2022    7:29 AM 05/18/2022    9:34 AM 04/08/2022    9:21 AM  CBC  WBC 4.0 - 10.5 K/uL 8.5  8.2  9.3   Hemoglobin 12.0 - 15.0 g/dL 11.5  12.4  12.6   Hematocrit 36.0 - 46.0 % 33.9  36.6  37.4   Platelets 150 - 400 K/uL 263  162  239        Latest Ref Rng & Units 08/23/2022    7:29 AM 05/18/2022    9:34 AM 04/08/2022    9:21 AM  CMP  Glucose 70 - 99 mg/dL 200  209  174   BUN 6 - 20 mg/dL 11  11  8   Creatinine 0.44 - 1.00 mg/dL 0.60  0.65  0.60   Sodium 135 - 145 mmol/L 138  140  140   Potassium 3.5 - 5.1 mmol/L 3.7  3.8  4.1   Chloride 98 - 111 mmol/L 103  102  103   CO2 22 - 32 mmol/L 27  30  30   $ Calcium 8.9 - 10.3 mg/dL 8.7  9.0  9.1   Total Protein 6.5 - 8.1 g/dL 6.4  7.7  7.3   Total Bilirubin 0.3 - 1.2 mg/dL 0.5  0.5  0.6   Alkaline Phos 38 - 126 U/L 77  77  74   AST 15 - 41 U/L 18  17  13   $ ALT 0 - 44 U/L 18  18  11    $ . Lab Results  Component Value Date   IRON 61 08/23/2022   TIBC 409 08/23/2022   IRONPCTSAT 15 08/23/2022  . (Iron and TIBC)  Lab Results  Component Value Date   FERRITIN 41 08/23/2022    RADIOGRAPHIC STUDIES: I have personally reviewed the radiological images as listed and agreed with the findings in the report. No results found.  ASSESSMENT & PLAN:   47 y.o. African-American female with   1) Chronic ITP since 2012 now with acute relapse of her ITP with very labile platelet counts. No overt bleeding at this time. Patient has been on and off steroids and has been on Promacta since 2016. Was apparently on Promacta 62m po MWF. No issues with bleeding at this time. No petechiae B12 wnl. Ultrasound abdomen 06/02/2016- did show some mild splenomegaly which could be an additional factor in her thrombocytopenia. The spleen  measures 12.9 x 13.9 x 6.7 cm. The calculated volume is 628 cc. Unclear etiology of splenomegaly. Liver appeared normal.  Patient's platelet counts have normalized after treatment with Rituxan weekly 4 doses with platelets improved temporarily but she needed additional treatment.   2) Iron deficiency Anemia - likely from heavy menstrual losses. -Tolerating the po iron without any significant issues.  Iron/TIBC/Ferritin/ %Sat    Component Value Date/Time   IRON 61 08/23/2022 0729   IRON 54 04/15/2017 0835   TIBC 409 08/23/2022 0729   TIBC 411 04/15/2017 0835   FERRITIN 41 08/23/2022 0729   FERRITIN 43 04/15/2017 0835   IRONPCTSAT 15 08/23/2022 0729   IRONPCTSAT 13 (L) 04/15/2017 0835    Plan  -Continue iron polysaccharide 150 mg.  P.o. daily.  3)  Hypothyroidism - on levothyroxine As per primary care physician -continue Mx per PCP  4) hyperglycemia-likely diabetes -Patient recommended to follow-up with primary care physician for further evaluation and management -Recommended lifestyle changes to focus on appropriate diet and weight loss  4) . Patient Active Problem List   Diagnosis Date Noted   Personal history of colonic polyps    Hx of adenomatous colonic polyps    Menorrhagia with irregular cycle 12/04/2020   Body mass index (BMI) of 50.0 to 59.9 in adult ([facility]    BRBPR (bright red blood per rectum)    Polyp of descending colon    Encntr for gyn exam (general) (routine) w/o abn findings 10/13/2016   Exposure to STD 10/13/2016   [place] 05/03[place]   Thrombocytopenia ([facility] 04/19/2016   Iron deficiency anemia 04/19/2016   Hypothyroidism 04/19/2016   Degenerative arthritis 04/19/2016   Generalized anxiety disorder 04/19/2016  -continue f/u with PCP  5) H/o PE in 2004-- still has IVC filter  in situ. Thought to be triggered by immobility and obesity. -not on anticoagulation at this time.   PLAN:  -Discussed lab results from  08/23/2022 with the patient. CBC shows hemoglobin of 11.5 and hematocrit of 33.9. CMP shows elevated blood glucose of 200. -Recommended to follow-up with OBGYN regarding heavy menstrual cycle.  -We will change Promacta to 25 mg x 4 days a week, Monday Tuesday Thursday and Friday.  -Advised patient to follow up with PCP regarding diabetes.  -Patient wants to hold off IV Iron.    FOLLOW UP: Phone visit with Dr. Irene Limbo in 2 months Labs 1 day prior to phone visit   The total time spent in the appointment was 12 minutes* .  All of the patient's questions were answered with apparent satisfaction. The patient knows to call the clinic with any problems, questions or concerns.   Sullivan Lone MD MS AAHIVMS [facility] Hematology/Oncology Physician [facility]  .*Total Encounter Time as defined by the Centers for Medicare and Medicaid Services includes, in addition to the face-to-face time of a patient visit (documented in the note above) non-face-to-face time: obtaining and reviewing outside history, ordering and reviewing medications, tests or procedures, care coordination (communications with other health care professionals or caregivers) and documentation in the medical record.   I, Cleda Mccreedy, am acting as a Education administrator for Sullivan Lone, MD.  .I have reviewed the above documentation for accuracy and completeness, and I agree with the above. Brunetta Genera MD

## 2022-08-25 ENCOUNTER — Telehealth: Payer: Self-pay | Admitting: Hematology

## 2022-08-25 NOTE — Telephone Encounter (Signed)
Called patient per 2/6 los notes to schedule f/u. Left voicemail with new appointment information and contact details if needing to reschedule.  

## 2022-09-16 ENCOUNTER — Other Ambulatory Visit (HOSPITAL_COMMUNITY): Payer: Self-pay

## 2022-09-20 ENCOUNTER — Other Ambulatory Visit (HOSPITAL_COMMUNITY): Payer: Self-pay

## 2022-09-22 ENCOUNTER — Other Ambulatory Visit (HOSPITAL_COMMUNITY): Payer: Self-pay

## 2022-09-24 ENCOUNTER — Ambulatory Visit
Admission: RE | Admit: 2022-09-24 | Discharge: 2022-09-24 | Disposition: A | Discharge status: Home / Self Care | Payer: Medicaid Other | Source: Ambulatory Visit | Attending: Family Medicine | Admitting: Family Medicine

## 2022-09-24 DIAGNOSIS — R928 Other abnormal and inconclusive findings on diagnostic imaging of breast: Secondary | ICD-10-CM

## 2022-10-21 ENCOUNTER — Other Ambulatory Visit: Payer: Self-pay

## 2022-10-21 DIAGNOSIS — D693 Immune thrombocytopenic purpura: Secondary | ICD-10-CM

## 2022-10-25 ENCOUNTER — Inpatient Hospital Stay: Payer: Medicaid Other | Attending: Hematology

## 2022-10-25 DIAGNOSIS — Z86711 Personal history of pulmonary embolism: Secondary | ICD-10-CM | POA: Diagnosis not present

## 2022-10-25 DIAGNOSIS — N920 Excessive and frequent menstruation with regular cycle: Secondary | ICD-10-CM | POA: Diagnosis not present

## 2022-10-25 DIAGNOSIS — G43909 Migraine, unspecified, not intractable, without status migrainosus: Secondary | ICD-10-CM | POA: Insufficient documentation

## 2022-10-25 DIAGNOSIS — E039 Hypothyroidism, unspecified: Secondary | ICD-10-CM | POA: Diagnosis not present

## 2022-10-25 DIAGNOSIS — M1990 Unspecified osteoarthritis, unspecified site: Secondary | ICD-10-CM | POA: Insufficient documentation

## 2022-10-25 DIAGNOSIS — D693 Immune thrombocytopenic purpura: Secondary | ICD-10-CM | POA: Diagnosis present

## 2022-10-25 DIAGNOSIS — Z79899 Other long term (current) drug therapy: Secondary | ICD-10-CM | POA: Diagnosis not present

## 2022-10-25 DIAGNOSIS — F419 Anxiety disorder, unspecified: Secondary | ICD-10-CM | POA: Insufficient documentation

## 2022-10-25 DIAGNOSIS — Z8049 Family history of malignant neoplasm of other genital organs: Secondary | ICD-10-CM | POA: Insufficient documentation

## 2022-10-25 DIAGNOSIS — D500 Iron deficiency anemia secondary to blood loss (chronic): Secondary | ICD-10-CM | POA: Diagnosis not present

## 2022-10-25 DIAGNOSIS — Z833 Family history of diabetes mellitus: Secondary | ICD-10-CM | POA: Insufficient documentation

## 2022-10-25 DIAGNOSIS — R739 Hyperglycemia, unspecified: Secondary | ICD-10-CM | POA: Insufficient documentation

## 2022-10-25 LAB — CBC WITH DIFFERENTIAL (CANCER CENTER ONLY)
Abs Immature Granulocytes: 0.05 10*3/uL (ref 0.00–0.07)
Basophils Absolute: 0 10*3/uL (ref 0.0–0.1)
Basophils Relative: 0 %
Eosinophils Absolute: 0.2 10*3/uL (ref 0.0–0.5)
Eosinophils Relative: 2 %
HCT: 38.1 % (ref 36.0–46.0)
Hemoglobin: 12.8 g/dL (ref 12.0–15.0)
Immature Granulocytes: 1 %
Lymphocytes Relative: 14 %
Lymphs Abs: 1.1 10*3/uL (ref 0.7–4.0)
MCH: 26.9 pg (ref 26.0–34.0)
MCHC: 33.6 g/dL (ref 30.0–36.0)
MCV: 80.2 fL (ref 80.0–100.0)
Monocytes Absolute: 0.5 10*3/uL (ref 0.1–1.0)
Monocytes Relative: 7 %
Neutro Abs: 5.9 10*3/uL (ref 1.7–7.7)
Neutrophils Relative %: 76 %
Platelet Count: 204 10*3/uL (ref 150–400)
RBC: 4.75 MIL/uL (ref 3.87–5.11)
RDW: 13.2 % (ref 11.5–15.5)
WBC Count: 7.7 10*3/uL (ref 4.0–10.5)
nRBC: 0 % (ref 0.0–0.2)

## 2022-10-25 LAB — CMP (CANCER CENTER ONLY)
ALT: 17 U/L (ref 0–44)
AST: 18 U/L (ref 15–41)
Albumin: 3.8 g/dL (ref 3.5–5.0)
Alkaline Phosphatase: 68 U/L (ref 38–126)
Anion gap: 8 (ref 5–15)
BUN: 8 mg/dL (ref 6–20)
CO2: 27 mmol/L (ref 22–32)
Calcium: 9 mg/dL (ref 8.9–10.3)
Chloride: 103 mmol/L (ref 98–111)
Creatinine: 0.67 mg/dL (ref 0.44–1.00)
GFR, Estimated: >60 mL/min (ref >60)
Glucose, Bld: 193 mg/dL — ABNORMAL HIGH (ref 70–99)
Potassium: 3.7 mmol/L (ref 3.5–5.1)
Sodium: 138 mmol/L (ref 135–145)
Total Bilirubin: 0.5 mg/dL (ref 0.3–1.2)
Total Protein: 6.8 g/dL (ref 6.5–8.1)

## 2022-10-25 LAB — IRON AND IRON BINDING CAPACITY (CC-WL,HP ONLY)
Iron: 57 ug/dL (ref 28–170)
Saturation Ratios: 13 % (ref 10.4–31.8)
TIBC: 428 ug/dL (ref 250–450)
UIBC: 371 ug/dL (ref 148–442)

## 2022-10-25 LAB — FERRITIN: Ferritin: 46 ng/mL (ref 11–307)

## 2022-10-26 ENCOUNTER — Other Ambulatory Visit: Payer: Self-pay

## 2022-10-26 ENCOUNTER — Other Ambulatory Visit (HOSPITAL_COMMUNITY): Payer: Self-pay

## 2022-10-26 ENCOUNTER — Inpatient Hospital Stay (HOSPITAL_BASED_OUTPATIENT_CLINIC_OR_DEPARTMENT_OTHER): Payer: Medicaid Other | Admitting: Hematology

## 2022-10-26 DIAGNOSIS — D693 Immune thrombocytopenic purpura: Secondary | ICD-10-CM | POA: Diagnosis not present

## 2022-10-26 MED ORDER — ELTROMBOPAG OLAMINE 25 MG PO TABS
ORAL_TABLET | ORAL | 11 refills | Status: DC
Start: 2022-10-26 — End: 2023-08-31
  Filled 2022-10-26: qty 30, fill #0
  Filled 2022-11-29: qty 12, 28d supply, fill #0
  Filled 2023-01-26 – 2023-01-27 (×3): qty 12, 28d supply, fill #1
  Filled 2023-02-15: qty 12, 28d supply, fill #2
  Filled 2023-03-16: qty 12, 28d supply, fill #3
  Filled 2023-04-06: qty 12, 28d supply, fill #4
  Filled 2023-05-11: qty 12, 28d supply, fill #5
  Filled 2023-05-31: qty 12, 28d supply, fill #6
  Filled 2023-06-24: qty 12, 28d supply, fill #7
  Filled 2023-08-01: qty 12, 28d supply, fill #8
  Filled 2023-08-29: qty 12, 28d supply, fill #9

## 2022-10-26 NOTE — Progress Notes (Signed)
HEMATOLOGY/ONCOLOGY PHONE VISIT NOTE  Date of Service: 10/26/22   PCP: .Leilani Ableeese, Betti, MD  CHIEF COMPLAINTS/PURPOSE OF CONSULTATION:  Follow-up for continued evaluation and management of chronic ITP  HISTORY OF PRESENTING ILLNESS:   Please see previous note for details of initial presentation  INTERVAL HISTORY: Kiara Barrera is a 47 y.o. female who was connected via phone for continued evaluation and management of her chronic ITP.  Patient was last seen by me on 08/24/2022 and complained of heavy menstrual period, but was otherwise doing well overall with no new medical complaints.   I connected with Kiara Barrera on 10/26/22 at  8:40 AM EDT by telephone visit and verified that I am speaking with the correct person using two identifiers.   I discussed the limitations, risks, security and privacy concerns of performing an evaluation and management service by telemedicine and the availability of in-person appointments. I also discussed with the patient that there may be a patient responsible charge related to this service. The patient expressed understanding and agreed to proceed.   Other persons participating in the visit and their role in the encounter: none   Patient's location: home  [facility]   Chief Complaint: Follow-up for continued evaluation and management of chronic ITP   Today, she reports that she had been feeling well overall. She denies any toxicity issues with her current dose of Promacta. She denies any major issues with abnormal bleeding/bruising, but she does note having a recent bruise on her chest which may have been triggered by contact or by her recent Mammogram on September 24, 2022. Her bruise has since resolved.   She reports that her periods have been normal, lasting 8 days. She notes that the first 3-4 days are the heaviest. She continues to take her oral iron tablets.  She reports that she continues to follow up with her  PCP regarding her diabetes evaluation and management.   MEDICAL HISTORY:  #1 chronic ITP #2 Iron deficiency anemia due to heavy periods #3 hypothyroidism #4 degenerative arthritis #5 anxiety disorder #6 history of pulmonary embolism in 2004 status post IVC filter placement. 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 #7 migraine headaches  SURGICAL HISTORY: Past Surgical History:  Procedure Laterality Date   BREAST BIOPSY Left    Pt believes she had lymphnode biopsy   C secton  2002   COLONOSCOPY     COLONOSCOPY WITH PROPOFOL N/A 02/07/2018   Procedure: COLONOSCOPY WITH PROPOFOL;  Surgeon: Napoleon FormNandigam, Kavitha V, MD;  [facility];  Service: Endoscopy;  Laterality: N/A;   COLONOSCOPY WITH PROPOFOL N/A 03/31/2022   Procedure: COLONOSCOPY WITH PROPOFOL;  Surgeon: Napoleon FormNandigam, Kavitha V, MD;  [facility];  Service: Gastroenterology;  Laterality: N/A;   IVC FILTER PLACEMENT ([facility] HX)  2004   Left axillary lymph node excision biopsy  2008   POLYPECTOMY  02/07/2018   Procedure: POLYPECTOMY;  Surgeon: Napoleon FormNandigam, Kavitha V, MD;  [facility];  Service: Endoscopy;;  Clips Placed    SOCIAL HISTORY: Social History   Socioeconomic History   Marital status: Single    Spouse name: Not on file   Number of children: Not on file   Years of education: Not on file   Highest education level: Not on file  Occupational History   Not on file  Tobacco Use   Smoking status: Never  Passive exposure: Past (lived with aunt who smoked in early 2000's, for about one year)   Smokeless tobacco: Never  Vaping Use   Vaping Use: Never used  Substance and Sexual Activity   Alcohol use: Yes    Comment: occasional: 1-2x per times per year one drink   Drug use: Not Currently    Types: Marijuana    Comment: expirimented as a teen   Sexual activity: Yes    Partners: Male    Birth  control/protection: None  Other Topics Concern   Not on file  Social History Narrative   Not on file   Social Determinants of Health   Financial Resource Strain: Not on file  Food Insecurity: Not on file  Transportation Needs: Not on file  Physical Activity: Not on file  Stress: Not on file  Social Connections: Not on file  Intimate Partner Violence: Not on file  Recently moved from [place], [place] to [place]. Nonsmoker minimal social alcohol use no drug use Currently unemployed Has a 62 year old daughter  FAMILY HISTORY: No known history of blood disorders Mother had a history of cervical cancer  maternal grandmother diabetes type 2 Maternal uncle diabetes type 2  ALLERGIES:  has No Known Allergies.  MEDICATIONS:  Current Outpatient Medications  Medication Sig Dispense Refill   acetaminophen (TYLENOL) 500 MG tablet Take 1,500 mg by mouth daily as needed for moderate pain.     Calcium Carb-Cholecalciferol (CALCIUM 600+D) 600-20 MG-MCG TABS Take 1 tablet by mouth daily.     Cholecalciferol (VITAMIN D3) 25 MCG (1000 UT) CAPS Take 1,000 Units by mouth daily.     eltrombopag (PROMACTA) 25 MG tablet Take 1 tablet ( ) by mouth daily from Monday through Friday (5 days a week) 30 tablet 11   ferrous sulfate 325 (65 FE) MG tablet Take 325 mg by mouth 2 (two) times daily.     hydrochlorothiazide (HYDRODIURIL) 25 MG tablet Take 25 mg by mouth daily.     levothyroxine (SYNTHROID) 88 MCG tablet Take 88 mcg by mouth daily before breakfast.     Multiple Vitamin (MULTIVITAMIN) tablet Take 1 tablet by mouth daily.     PEG-KCl-NaCl-NaSulf-Na Asc-C (PLENVU) 140 g SOLR Split dose prep following the doctor instruction 1 each 0   Turmeric 500 MG TABS Take 500 mg by mouth daily.     vitamin B-12 (CYANOCOBALAMIN) 1000 MCG tablet Take 1,000 mcg by mouth daily.     No current facility-administered medications for this visit.    REVIEW OF SYSTEMS:    10 Point review of Systems was done is  negative except as noted above.   PHYSICAL EXAMINATION: Telemedicine visit  LABORATORY DATA:  I have reviewed the data as listed  .    Latest Ref Rng & Units 10/25/2022    8:26 AM 08/23/2022    7:29 AM 05/18/2022    9:34 AM  CBC  WBC 4.0 - 10.5 K/uL 7.7  8.5  8.2   Hemoglobin 12.0 - 15.0 g/dL 16.1  09.6  04.5   Hematocrit 36.0 - 46.0 % 38.1  33.9  36.6   Platelets 150 - 400 K/uL 204  263  162        Latest Ref Rng & Units 10/25/2022    8:26 AM 08/23/2022    7:29 AM 05/18/2022    9:34 AM  CMP  Glucose 70 - 99 mg/dL 409  811  914   BUN 6 - 20 mg/dL Creatinine  0.44 - 1.00 mg/dL 1.61  0.96  0.45   Sodium 135 - 145 mmol/L 138  138  140   Potassium 3.5 - 5.1 mmol/L 3.7  3.7  3.8   Chloride 98 - 111 mmol/L 103  103  102   CO2 22 - 32 mmol/L 27  27  30    Calcium 8.9 - 10.3 mg/dL 9.0  8.7  9.0   Total Protein 6.5 - 8.1 g/dL 6.8  6.4  7.7   Total Bilirubin 0.3 - 1.2 mg/dL 0.5  0.5  0.5   Alkaline Phos 38 - 126 U/L 68  77  77   AST 15 - 41 U/L 18  18  17    ALT 0 - 44 U/L 17  18  18     . Lab Results  Component Value Date   IRON 57 10/25/2022   TIBC 428 10/25/2022   IRONPCTSAT 13 10/25/2022  . (Iron and TIBC)  Lab Results  Component Value Date   FERRITIN 46 10/25/2022    RADIOGRAPHIC STUDIES: I have personally reviewed the radiological images as listed and agreed with the findings in the report. No results found.  ASSESSMENT & PLAN:   47 y.o. African-American female with   1) Chronic ITP since 2012 now with acute relapse of her ITP with very labile platelet counts. No overt bleeding at this time. Patient has been on and off steroids and has been on Promacta since 2016. Was apparently on Promacta 25mg  po MWF. No issues with bleeding at this time. No petechiae B12 wnl. Ultrasound abdomen 06/02/2016- did show some mild splenomegaly which could be an additional factor in her thrombocytopenia. The spleen measures 12.9 x 13.9 x 6.7 cm. The calculated volume is  628 cc. Unclear etiology of splenomegaly. Liver appeared normal.  Patient's platelet counts have normalized after treatment with Rituxan weekly 4 doses with platelets improved temporarily but she needed additional treatment.   2) Iron deficiency Anemia - likely from heavy menstrual losses. -Tolerating the po iron without any significant issues.  Iron/TIBC/Ferritin/ %Sat    Component Value Date/Time   IRON 57 10/25/2022 0826   IRON 54 04/15/2017 0835   TIBC 428 10/25/2022 0826   TIBC 411 04/15/2017 0835   FERRITIN 46 10/25/2022 0827   FERRITIN 43 04/15/2017 0835   IRONPCTSAT 13 10/25/2022 0826   IRONPCTSAT 13 (L) 04/15/2017 0835    Plan  -Continue iron polysaccharide 150 mg.  P.o. daily.  3)  Hypothyroidism - on levothyroxine As per primary care physician -continue Mx per PCP  4) hyperglycemia-likely diabetes -Patient recommended to follow-up with primary care physician for further evaluation and management -Recommended lifestyle changes to focus on appropriate diet and weight loss  4) . Patient Active Problem List   Diagnosis Date Noted   Personal history of colonic polyps    Hx of adenomatous colonic polyps    Menorrhagia with irregular cycle 12/04/2020   Body mass index (BMI) of 50.0 to 59.9 in adult    BRBPR (bright red blood per rectum)    Polyp of descending colon    Encntr for gyn exam (general) (routine) w/o abn findings 10/13/2016   Exposure to STD 10/13/2016   Chronic ITP (idiopathic thrombocytopenia) 05/03/2016   Thrombocytopenia 04/19/2016   Iron deficiency anemia 04/19/2016   Hypothyroidism 04/19/2016   Degenerative arthritis 04/19/2016   Generalized anxiety disorder 04/19/2016  -continue f/u with PCP  5) H/o PE in 2004-- still has IVC filter in situ. Thought to be triggered by  immobility and obesity. -not on anticoagulation at this time.   PLAN:   -Discussed lab results on 10/25/2022 with patient. CBC showed WBC of 7.7K, hemoglobin improved to 12.8,  and platelets of 204K. -CBC normal -CMP steady. Glucose still at 193, other CMP levels normal -Platelet levels normal at this time even with lower dose of Promacta. Will further lower dose of Promacta 25 MG to 3 days a week: Monday, Wednesday, Friday -ferritin level 41 -Iron saturation level 15% -Continue oral iron at this time -Mammogram September 24, 2022 revealed stable results. -repeat labs in a few months -informed patient that HCTZ may have a tendency to increase blood sugars. Will connect with PCP for consideration of adjusting dose. -Educated patient on hemoglobin A1C test -Patient shall continue to follow-up with her PCP regularly in regards to her DM evaluation and management  FOLLOW-UP: Phone visit with Dr. Candise Che in 2 months Labs 1 day prior to phone visit  The total time spent in the appointment was 20 minutes* .  All of the patient's questions were answered with apparent satisfaction. The patient knows to call the clinic with any problems, questions or concerns.   Wyvonnia Lora MD MS AAHIVMS [facility] Hematology/Oncology Physician [facility]  .*Total Encounter Time as defined by the Centers for Medicare and Medicaid Services includes, in addition to the face-to-face time of a patient visit (documented in the note above) non-face-to-face time: obtaining and reviewing outside history, ordering and reviewing medications, tests or procedures, care coordination (communications with other health care professionals or caregivers) and documentation in the medical record.    I,Kiara Barrera,acting as a Neurosurgeon for Wyvonnia Lora, MD.,have documented all relevant documentation on the behalf of Wyvonnia Lora, MD,as directed by  Wyvonnia Lora, MD while in the presence of Wyvonnia Lora, MD.  .I have reviewed the above documentation for accuracy and completeness, and I agree with the above. Johney Maine MD

## 2022-10-27 ENCOUNTER — Telehealth: Payer: Self-pay | Admitting: Hematology

## 2022-10-28 ENCOUNTER — Other Ambulatory Visit (HOSPITAL_COMMUNITY): Payer: Self-pay

## 2022-11-29 ENCOUNTER — Other Ambulatory Visit (HOSPITAL_COMMUNITY): Payer: Self-pay

## 2022-11-29 ENCOUNTER — Other Ambulatory Visit: Payer: Self-pay

## 2022-12-02 ENCOUNTER — Other Ambulatory Visit (HOSPITAL_COMMUNITY): Payer: Self-pay

## 2022-12-24 ENCOUNTER — Other Ambulatory Visit: Payer: Self-pay

## 2022-12-24 DIAGNOSIS — D693 Immune thrombocytopenic purpura: Secondary | ICD-10-CM

## 2022-12-27 ENCOUNTER — Other Ambulatory Visit: Payer: Self-pay

## 2022-12-27 ENCOUNTER — Inpatient Hospital Stay: Payer: Medicaid Other | Attending: Hematology

## 2022-12-27 DIAGNOSIS — Z79899 Other long term (current) drug therapy: Secondary | ICD-10-CM | POA: Insufficient documentation

## 2022-12-27 DIAGNOSIS — D693 Immune thrombocytopenic purpura: Secondary | ICD-10-CM | POA: Insufficient documentation

## 2022-12-27 DIAGNOSIS — Z79890 Hormone replacement therapy (postmenopausal): Secondary | ICD-10-CM | POA: Insufficient documentation

## 2022-12-27 DIAGNOSIS — N920 Excessive and frequent menstruation with regular cycle: Secondary | ICD-10-CM | POA: Insufficient documentation

## 2022-12-27 DIAGNOSIS — D500 Iron deficiency anemia secondary to blood loss (chronic): Secondary | ICD-10-CM | POA: Insufficient documentation

## 2022-12-27 DIAGNOSIS — E039 Hypothyroidism, unspecified: Secondary | ICD-10-CM | POA: Insufficient documentation

## 2022-12-27 DIAGNOSIS — F419 Anxiety disorder, unspecified: Secondary | ICD-10-CM | POA: Insufficient documentation

## 2022-12-27 DIAGNOSIS — Z86711 Personal history of pulmonary embolism: Secondary | ICD-10-CM | POA: Insufficient documentation

## 2022-12-27 DIAGNOSIS — Z8049 Family history of malignant neoplasm of other genital organs: Secondary | ICD-10-CM | POA: Diagnosis not present

## 2022-12-27 DIAGNOSIS — Z833 Family history of diabetes mellitus: Secondary | ICD-10-CM | POA: Diagnosis not present

## 2022-12-27 DIAGNOSIS — G43909 Migraine, unspecified, not intractable, without status migrainosus: Secondary | ICD-10-CM | POA: Insufficient documentation

## 2022-12-27 DIAGNOSIS — R739 Hyperglycemia, unspecified: Secondary | ICD-10-CM | POA: Insufficient documentation

## 2022-12-27 DIAGNOSIS — M1990 Unspecified osteoarthritis, unspecified site: Secondary | ICD-10-CM | POA: Diagnosis not present

## 2022-12-27 LAB — CBC WITH DIFFERENTIAL (CANCER CENTER ONLY)
Abs Immature Granulocytes: 0.03 10*3/uL (ref 0.00–0.07)
Basophils Absolute: 0 10*3/uL (ref 0.0–0.1)
Basophils Relative: 0 %
Eosinophils Absolute: 0.3 10*3/uL (ref 0.0–0.5)
Eosinophils Relative: 4 %
HCT: 40.5 % (ref 36.0–46.0)
Hemoglobin: 13.9 g/dL (ref 12.0–15.0)
Immature Granulocytes: 0 %
Lymphocytes Relative: 17 %
Lymphs Abs: 1.2 10*3/uL (ref 0.7–4.0)
MCH: 27.1 pg (ref 26.0–34.0)
MCHC: 34.3 g/dL (ref 30.0–36.0)
MCV: 78.9 fL — ABNORMAL LOW (ref 80.0–100.0)
Monocytes Absolute: 0.6 10*3/uL (ref 0.1–1.0)
Monocytes Relative: 8 %
Neutro Abs: 5.4 10*3/uL (ref 1.7–7.7)
Neutrophils Relative %: 71 %
Platelet Count: 102 10*3/uL — ABNORMAL LOW (ref 150–400)
RBC: 5.13 MIL/uL — ABNORMAL HIGH (ref 3.87–5.11)
RDW: 12.7 % (ref 11.5–15.5)
WBC Count: 7.5 10*3/uL (ref 4.0–10.5)
nRBC: 0 % (ref 0.0–0.2)

## 2022-12-27 LAB — CMP (CANCER CENTER ONLY)
ALT: 16 U/L (ref 0–44)
AST: 15 U/L (ref 15–41)
Albumin: 3.8 g/dL (ref 3.5–5.0)
Alkaline Phosphatase: 89 U/L (ref 38–126)
Anion gap: 8 (ref 5–15)
BUN: 13 mg/dL (ref 6–20)
CO2: 25 mmol/L (ref 22–32)
Calcium: 9.2 mg/dL (ref 8.9–10.3)
Chloride: 103 mmol/L (ref 98–111)
Creatinine: 0.67 mg/dL (ref 0.44–1.00)
GFR, Estimated: >60 mL/min (ref >60)
Glucose, Bld: 279 mg/dL — ABNORMAL HIGH (ref 70–99)
Potassium: 4 mmol/L (ref 3.5–5.1)
Sodium: 136 mmol/L (ref 135–145)
Total Bilirubin: 0.4 mg/dL (ref 0.3–1.2)
Total Protein: 7.4 g/dL (ref 6.5–8.1)

## 2022-12-27 LAB — FERRITIN: Ferritin: 68 ng/mL (ref 11–307)

## 2022-12-27 LAB — IRON AND IRON BINDING CAPACITY (CC-WL,HP ONLY)
Iron: 53 ug/dL (ref 28–170)
Saturation Ratios: 13 % (ref 10.4–31.8)
TIBC: 416 ug/dL (ref 250–450)
UIBC: 363 ug/dL (ref 148–442)

## 2022-12-28 ENCOUNTER — Inpatient Hospital Stay (HOSPITAL_BASED_OUTPATIENT_CLINIC_OR_DEPARTMENT_OTHER): Payer: Medicaid Other | Admitting: Hematology

## 2022-12-28 DIAGNOSIS — D693 Immune thrombocytopenic purpura: Secondary | ICD-10-CM | POA: Diagnosis not present

## 2022-12-28 NOTE — Progress Notes (Signed)
HEMATOLOGY/ONCOLOGY PHONE VISIT NOTE  Date of Service: 12/28/22   PCP: .Leilani Able, MD  CHIEF COMPLAINTS/PURPOSE OF CONSULTATION:  Follow-up for continued evaluation and management of chronic ITP  HISTORY OF PRESENTING ILLNESS:   Please see previous note for details of initial presentation  INTERVAL HISTORY: Kiara Barrera is a 47 y.o. female who was connected via phone for continued evaluation and management of her chronic ITP.  I connected with Kiara Barrera on 12/28/22 at  8:40 AM EDT by telephone visit and verified that I am speaking with the correct person using two identifiers.   Patient was last connected via phone on 10/26/2022 and she was doing well overall.   Patient notes she has been doing well overall without any new or severe medical concerns. She reports that her menstrual cycle have been normal.   She has been tolerating her current dose of Promacta well without any new or severe toxicities.   She denies any infection issues, fever, chills, night sweats, abnormal bleeding issues, abdominal pain, back pain, chest pain, or leg swelling.   Discussed lab results from 12/27/2022 in detail with the patient.  I discussed the limitations, risks, security and privacy concerns of performing an evaluation and management service by telemedicine and the availability of in-person appointments. I also discussed with the patient that there may be a patient responsible charge related to this service. The patient expressed understanding and agreed to proceed.   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 #7 migraine headaches  SURGICAL HISTORY: Past Surgical History:  Procedure Laterality Date   BREAST BIOPSY Left    Pt believes she had lymphnode biopsy   C secton  2002   COLONOSCOPY     COLONOSCOPY WITH PROPOFOL N/A 02/07/2018   Procedure: COLONOSCOPY WITH PROPOFOL;  Surgeon: Napoleon Form, MD;  Location: [facility];  Service: Endoscopy;  Laterality: N/A;   COLONOSCOPY WITH PROPOFOL N/A 03/31/2022   Procedure: COLONOSCOPY WITH PROPOFOL;  Surgeon: Napoleon Form, MD;  Location: [facility];  Service: Gastroenterology;  Laterality: N/A;   IVC FILTER PLACEMENT ([facility] HX)  2004   Left axillary lymph node excision biopsy  2008   POLYPECTOMY  02/07/2018   Procedure: POLYPECTOMY;  Surgeon: Napoleon Form, MD;  Location: [facility];  Service: Endoscopy;;  Clips Placed    SOCIAL HISTORY: Social History   Socioeconomic History   Marital status: Single    Spouse name: Not on file   Number of children: Not on file   Years of education: Not on file   Highest education level: Not on file  Occupational History   Not on file  Tobacco Use   Smoking status: Never    Passive exposure: Past (lived with aunt who smoked in early 2000's, for about one year)   Smokeless tobacco: Never  Vaping Use   Vaping Use: Never used  Substance and Sexual Activity   Alcohol use: Yes    Comment:  occasional: 1-2x per times per year one drink   Drug use: Not Currently    Types: Marijuana    Comment: expirimented as a teen   Sexual activity: Yes    Partners: Male    Birth control/protection: None  Other Topics Concern   Not on file  Social History Narrative   Not on file   Social Determinants of Health   Financial Resource Strain: Not on file  Food Insecurity: Not on file  Transportation  Needs: Not on file  Physical Activity: Not on file  Stress: Not on file  Social Connections: Not on file  Intimate Partner Violence: Not on file  Recently moved from [place], [place] to [place]. Nonsmoker minimal social alcohol use no drug use Currently unemployed Has a 56 year old daughter  FAMILY HISTORY: No known history of blood disorders Mother had a history of cervical cancer  maternal grandmother diabetes type 2 Maternal uncle diabetes type 2  ALLERGIES:  has No Known Allergies.  MEDICATIONS:  Current Outpatient Medications  Medication Sig Dispense Refill   acetaminophen (TYLENOL) 500 MG tablet Take 1,500 mg by mouth daily as needed for moderate pain.     Calcium Carb-Cholecalciferol (CALCIUM 600+D) 600-20 MG-MCG TABS Take 1 tablet by mouth daily.     Cholecalciferol (VITAMIN D3) 25 MCG (1000 UT) CAPS Take 1,000 Units by mouth daily.     eltrombopag (PROMACTA) 25 MG tablet Take 1 tablet (25mg ) by mouth 3 days a week on Monday, Wednesday and Friday. 30 tablet 11   ferrous sulfate 325 (65 FE) MG tablet Take 325 mg by mouth 2 (two) times daily.     hydrochlorothiazide (HYDRODIURIL) 25 MG tablet Take 25 mg by mouth daily.     levothyroxine (SYNTHROID) 88 MCG tablet Take 88 mcg by mouth daily before breakfast.     Multiple Vitamin (MULTIVITAMIN) tablet Take 1 tablet by mouth daily.     PEG-KCl-NaCl-NaSulf-Na Asc-C (PLENVU) 140 g SOLR Split dose prep following the doctor instruction 1 each 0   Turmeric 500 MG TABS Take 500 mg by mouth daily.     vitamin B-12 (CYANOCOBALAMIN) 1000 MCG tablet Take 1,000 mcg by mouth daily.     No current facility-administered medications for this visit.    REVIEW OF SYSTEMS:    10 Point review of Systems was done is negative except as noted above.   PHYSICAL EXAMINATION: Telemedicine visit  LABORATORY DATA:  I have reviewed the data as listed  .    Latest Ref Rng & Units 12/27/2022    7:33 AM 10/25/2022    8:26 AM 08/23/2022    7:29 AM   CBC  WBC 4.0 - 10.5 K/uL 7.5  7.7  8.5   Hemoglobin 12.0 - 15.0 g/dL 16.1  09.6  04.5   Hematocrit 36.0 - 46.0 % 40.5  38.1  33.9   Platelets 150 - 400 K/uL 102  204  263        Latest Ref Rng & Units 12/27/2022    7:33 AM 10/25/2022    8:26 AM 08/23/2022    7:29 AM  CMP  Glucose 70 - 99 mg/dL 409  811  914   BUN 6 - 20 mg/dL 13  8  11    Creatinine 0.44 - 1.00 mg/dL 7.82  9.56  2.13   Sodium 135 - 145 mmol/L 136  138  138   Potassium 3.5 - 5.1 mmol/L 4.0  3.7  3.7   Chloride 98 - 111 mmol/L 103  103  103   CO2 22 - 32 mmol/L 25  27  27    Calcium 8.9 - 10.3 mg/dL 9.2  9.0  8.7   Total Protein 6.5 - 8.1 g/dL 7.4  6.8  6.4   Total Bilirubin 0.3 - 1.2 mg/dL 0.4  0.5  0.5   Alkaline Phos 38 - 126 U/L 89  68  77   AST 15 - 41 U/L 15  18  18    ALT 0 - 44 U/L 16  17  18     . Lab Results  Component Value Date   IRON 53 12/27/2022   TIBC 416 12/27/2022   IRONPCTSAT 13 12/27/2022  . (Iron and TIBC)  Lab Results  Component Value Date   FERRITIN 68 12/27/2022    RADIOGRAPHIC STUDIES: I have personally reviewed the radiological images as listed and agreed with the findings in the report. No results found.  ASSESSMENT & PLAN:   47 y.o. African-American female with   1) Chronic ITP since 2012 now with acute relapse of her ITP with very labile platelet counts. No overt bleeding at this time. Patient has been on and off steroids and has been on Promacta since 2016. Was apparently on Promacta 25mg  po MWF. No issues with bleeding at this time. No petechiae B12 wnl. Ultrasound abdomen 06/02/2016- did show some mild splenomegaly which could be an additional factor in her thrombocytopenia. The spleen measures 12.9 x 13.9 x 6.7 cm. The calculated volume is 628 cc. Unclear etiology of splenomegaly. Liver appeared normal.  Patient's platelet counts have normalized after treatment with Rituxan weekly 4 doses with platelets improved temporarily but she needed additional treatment.   2)  Iron deficiency Anemia - likely from heavy menstrual losses. -Tolerating the po iron without any significant issues.  Iron/TIBC/Ferritin/ %Sat    Component Value Date/Time   IRON 53 12/27/2022 0733   IRON 54 04/15/2017 0835   TIBC 416 12/27/2022 0733   TIBC 411 04/15/2017 0835   FERRITIN 68 12/27/2022 0734   FERRITIN 43 04/15/2017 0835   IRONPCTSAT 13 12/27/2022 0733   IRONPCTSAT 13 (L) 04/15/2017 0835    Plan  -Continue iron polysaccharide 150 mg.  P.o. daily.  3)  Hypothyroidism - on levothyroxine As per primary care physician -continue Mx per PCP  4) hyperglycemia-likely diabetes -Patient recommended to follow-up with primary care physician for further evaluation and management -Recommended lifestyle changes to focus on appropriate diet and weight loss  4) . Patient Active Problem List   Diagnosis Date Noted   Personal history of colonic polyps    Hx of adenomatous colonic polyps    Menorrhagia with irregular cycle 12/04/2020   Body mass index (BMI) of 50.0 to 59.9 in adult (HCC)    BRBPR (bright red blood per rectum)    Polyp of descending colon    Encntr for gyn exam (general) (routine) w/o abn findings 10/13/2016   Exposure to STD 10/13/2016   Chronic ITP (idiopathic thrombocytopenia) (HCC) 05/03/2016   Thrombocytopenia (HCC) 04/19/2016   Iron deficiency anemia 04/19/2016   Hypothyroidism 04/19/2016   Degenerative arthritis 04/19/2016   Generalized anxiety disorder 04/19/2016  -continue f/u with PCP  5) H/o PE in 2004-- still has IVC filter in situ. Thought to be triggered by immobility and obesity. -not on anticoagulation at this time.   PLAN:  -Discussed lab results from 12/27/2022 with the patient. CBC shows decreased platelets at 102 K. CMP is stable overall except elevated glucose level at 279. Ferritin levels at  68 and iron saturation ratio at 13%. -We will not reduce Promacta dosage right now due to decreased platelet counts.  -Patient has been  tolerating her current Promacta dosage well without any new or severe toxicities.  -Continue Promacta 25 mg 3 days a week: Monday Wednesday and Friday   FOLLOW-UP: Phone visit with Dr Candise Che in 2 months  Labs 1 days prior to phone visit  The total time spent in the appointment was 14 minutes* .  All of the patient's questions were answered with apparent satisfaction. The patient knows to call the clinic with any problems, questions or concerns.   Wyvonnia Lora MD MS AAHIVMS [facility] Hematology/Oncology Physician [facility]  .*Total Encounter Time as defined by the Centers for Medicare and Medicaid Services includes, in addition to the face-to-face time of a patient visit (documented in the note above) non-face-to-face time: obtaining and reviewing outside history, ordering and reviewing medications, tests or procedures, care coordination (communications with other health care professionals or caregivers) and documentation in the medical record.   I, Ok Edwards, am acting as a Neurosurgeon for Wyvonnia Lora, MD. .I have reviewed the above documentation for accuracy and completeness, and I agree with the above. Johney Maine MD

## 2023-01-05 ENCOUNTER — Telehealth: Payer: Self-pay | Admitting: Hematology

## 2023-01-19 ENCOUNTER — Encounter (HOSPITAL_COMMUNITY): Payer: Self-pay | Admitting: Emergency Medicine

## 2023-01-19 ENCOUNTER — Ambulatory Visit (HOSPITAL_COMMUNITY)
Admission: EM | Admit: 2023-01-19 | Discharge: 2023-01-19 | Disposition: A | Discharge status: Home / Self Care | Payer: Medicaid Other | Attending: Family Medicine | Admitting: Family Medicine

## 2023-01-19 DIAGNOSIS — H1031 Unspecified acute conjunctivitis, right eye: Secondary | ICD-10-CM | POA: Diagnosis not present

## 2023-01-19 MED ORDER — OLOPATADINE HCL 0.1 % OP SOLN: 1.0000 [drp] | Freq: Two times a day (BID) | OPHTHALMIC | 0 refills | Status: AC

## 2023-01-19 NOTE — ED Provider Notes (Signed)
[facility]    CSN: [id number] Arrival date & time: 01/19/23  1105      History   Chief Complaint Chief Complaint  Patient presents with   Eye Drainage    HPI Kiara Barrera is a 47 y.o. female here right eye irritation that started this morning. At 0230 she woke up and her right eye was crusted shut. She woke up later that morning and continued to have closed shut eye and some mucuous-y discharge. Denies any contact lens use. No pain with EOM. No pain or itching just a dry/gritty feeling. She mentions she started to feel a sore throat after going to [employer] yesterday and some nasal drainage. No fevers or cough. No one around her sick. Used warm compress this AM. She also states she puts lavendar powder over her bedsheets and sleeps in front of fan which she believes may have irritated her eye as well.   Past Medical History:  Diagnosis Date   Anemia    Blood clots Left lung 2004   pt states hx of blood clots had PE   Hypothyroidism    Lower back pain    Obesity     Patient Active Problem List   Diagnosis Date Noted   Personal history of colonic polyps    Hx of adenomatous colonic polyps    Menorrhagia with irregular cycle 12/04/2020   Body mass index (BMI) of 50.0 to 59.9 in adult (HCC)    BRBPR (bright red blood per rectum)    Polyp of descending colon    Encntr for gyn exam (general) (routine) w/o abn findings 10/13/2016   Exposure to STD 10/13/2016   Chronic ITP (idiopathic thrombocytopenia) (HCC) 05/03/2016   Thrombocytopenia (HCC) 04/19/2016   Iron deficiency anemia 04/19/2016   Hypothyroidism 04/19/2016   Degenerative arthritis 04/19/2016   Generalized anxiety disorder 04/19/2016    Past Surgical History:  Procedure Laterality Date   BREAST BIOPSY Left    Pt believes she had lymphnode biopsy   C secton  2002   COLONOSCOPY     COLONOSCOPY WITH PROPOFOL N/A 02/07/2018   Procedure: COLONOSCOPY WITH PROPOFOL;  Surgeon: Napoleon Form,  MD;  Location: [facility];  Service: Endoscopy;  Laterality: N/A;   COLONOSCOPY WITH PROPOFOL N/A 03/31/2022   Procedure: COLONOSCOPY WITH PROPOFOL;  Surgeon: Napoleon Form, MD;  Location: [facility];  Service: Gastroenterology;  Laterality: N/A;   IVC FILTER PLACEMENT ([facility] HX)  2004   Left axillary lymph node excision biopsy  2008   POLYPECTOMY  02/07/2018   Procedure: POLYPECTOMY;  Surgeon: Napoleon Form, MD;  Location: [facility];  Service: Endoscopy;;  Clips Placed    OB History   No obstetric history on file.      Home Medications    Prior to Admission medications   Medication Sig Start Date End Date Taking? Authorizing Provider  olopatadine (PATADAY) 0.1 % ophthalmic solution Place 1 drop into the right eye 2 (two) times daily. 01/19/23  Yes Levin Erp, MD  acetaminophen (TYLENOL) 500 MG tablet Take 1,500 mg by mouth daily as needed for moderate pain.    [provider]  Calcium Carb-Cholecalciferol (CALCIUM 600+D) 600-20 MG-MCG TABS Take 1 tablet by mouth daily.    [provider]  Cholecalciferol (VITAMIN D3) 25 MCG (1000 UT) CAPS Take 1,000 Units by mouth daily.    [provider]  eltrombopag (PROMACTA) 25 MG tablet Take 1 tablet (25mg ) by mouth 3 days a week on Monday,  Wednesday and Friday. 10/26/22   Johney Maine, MD  ferrous sulfate 325 (65 FE) MG tablet Take 325 mg by mouth 2 (two) times daily.    [provider]  hydrochlorothiazide (HYDRODIURIL) 25 MG tablet Take 25 mg by mouth daily. 07/28/20   [provider]  levothyroxine (SYNTHROID) 88 MCG tablet Take 88 mcg by mouth daily before breakfast. 07/20/21   [provider]  Multiple Vitamin (MULTIVITAMIN) tablet Take 1 tablet by mouth daily.    [provider]  PEG-KCl-NaCl-NaSulf-Na Asc-C (PLENVU) 140 g SOLR Split dose prep following the doctor instruction 03/09/22   Napoleon Form, MD  Turmeric 500 MG TABS Take 500 mg by mouth  daily.    [provider]  vitamin B-12 (CYANOCOBALAMIN) 1000 MCG tablet Take 1,000 mcg by mouth daily.    [provider]    Family History Family History  Problem Relation Age of Onset   Ovarian cancer Mother    Kidney disease Mother    Kidney disease Brother    Ovarian cancer Maternal Aunt    Diabetes Maternal Grandmother    Kidney disease Maternal Grandmother    Colon cancer Neg Hx    Colon polyps Neg Hx    Esophageal cancer Neg Hx    Rectal cancer Neg Hx    Stomach cancer Neg Hx     Social History Social History   Tobacco Use   Smoking status: Never    Passive exposure: Past (lived with aunt who smoked in early 2000's, for about one year)   Smokeless tobacco: Never  Vaping Use   Vaping Use: Never used  Substance Use Topics   Alcohol use: Yes    Comment: occasional: 1-2x per times per year one drink   Drug use: Not Currently    Types: Marijuana    Comment: expirimented as a teen     Allergies   Patient has no known allergies.   Review of Systems Review of Systems  Constitutional:  Negative for chills and fever.  HENT:  Negative for ear pain and sore throat.   Eyes:  Positive for discharge and redness. Negative for photophobia, pain, itching and visual disturbance.  Respiratory:  Negative for cough and shortness of breath.   Cardiovascular:  Negative for chest pain and palpitations.  Gastrointestinal:  Negative for abdominal pain and vomiting.  Genitourinary:  Negative for dysuria and hematuria.  Musculoskeletal:  Negative for arthralgias and back pain.  Skin:  Negative for color change and rash.  Neurological:  Negative for seizures and syncope.  All other systems reviewed and are negative.    Physical Exam Triage Vital Signs ED Triage Vitals  Enc Vitals Group     BP 01/19/23 1120 (!) 149/77     Pulse Rate 01/19/23 1120 81     Resp 01/19/23 1120 17     Temp 01/19/23 1120 97.7 F (36.5 C)     Temp Source 01/19/23 1120 Oral      SpO2 01/19/23 1120 98 %     Weight --      Height --      Head Circumference --      Peak Flow --      Pain Score 01/19/23 1118 0     Pain Loc --      Pain Edu? --      Excl. in GC? --    No data found.  Updated Vital Signs BP (!) 149/77 (BP Location: Right Arm)   Pulse  81   Temp 97.7 F (36.5 C) (Oral)   Resp 17   LMP 12/26/2022   SpO2 98%   Visual Acuity Right Eye Distance:   Left Eye Distance:   Bilateral Distance:    Right Eye Near:   Left Eye Near:    Bilateral Near:     Physical Exam Vitals and nursing note reviewed.  Constitutional:      General: She is not in acute distress.    Appearance: She is well-developed.  HENT:     Head: Normocephalic and atraumatic.  Eyes:     General: Lids are normal. Vision grossly intact. Gaze aligned appropriately.        Right eye: No foreign body or discharge.        Left eye: No foreign body or discharge.     Extraocular Movements: Extraocular movements intact.     Conjunctiva/sclera:     Right eye: Right conjunctiva is injected. No exudate or hemorrhage.    Left eye: Left conjunctiva is not injected.  Cardiovascular:     Rate and Rhythm: Normal rate and regular rhythm.     Heart sounds: No murmur heard. Pulmonary:     Effort: Pulmonary effort is normal. No respiratory distress.     Breath sounds: Normal breath sounds.  Abdominal:     Palpations: Abdomen is soft.     Tenderness: There is no abdominal tenderness.  Musculoskeletal:        General: No swelling.     Cervical back: Neck supple.  Skin:    General: Skin is warm and dry.     Capillary Refill: Capillary refill takes less than 2 seconds.  Neurological:     Mental Status: She is alert.  Psychiatric:        Mood and Affect: Mood normal.      UC Treatments / Results  Labs (all labs ordered are listed, but only abnormal results are displayed) Labs Reviewed - No data to display  EKG   Radiology No results found.  Procedures Procedures (including  critical care time)  Medications Ordered in UC Medications - No data to display  Initial Impression / Assessment and Plan / UC Course  I have reviewed the triage vital signs and the nursing notes.  Pertinent labs & imaging results that were available during my care of the patient were reviewed by me and considered in my medical decision making (see chart for details).     47 yo female here for 1 day of eye irritation and crusting. Low likelihood of bacterial conjunctivitis given no contact lens use. Possible viral conjunctivitis given mild scratchy throat and nasal drainage. Possible irritant conjunctivitis due to lavender powder that she places on her bedsheets and sitting right in front of fan. Possible but less likely allergic conjunctivitis given no itching sensation of eye. Sent Rx for olopatadine and recommended warm compresses. If not improved or worsening in 5 days return to care.for reassessment.  Final Clinical Impressions(s) / UC Diagnoses   Final diagnoses:  Acute conjunctivitis of right eye, unspecified acute conjunctivitis type     Discharge Instructions      This is likely a viral conjunctivitis versus a irritant conjunctivitis I am prescribing an eye drop called pataday to do twice a day to help with the irritation Please use warm compresses in the mornings to help     ED Prescriptions     Medication Sig Dispense Auth. Provider   olopatadine (PATADAY) 0.1 % ophthalmic solution Place  1 drop into the right eye 2 (two) times daily. 5 mL Levin Erp, MD      PDMP not reviewed this encounter.   Levin Erp, MD 01/19/23 [phone]

## 2023-01-19 NOTE — Discharge Instructions (Signed)
This is likely a viral conjunctivitis versus a irritant conjunctivitis I am prescribing an eye drop called pataday to do twice a day to help with the irritation Please use warm compresses in the mornings to help

## 2023-01-19 NOTE — ED Triage Notes (Signed)
Pt reports woke up today with right eye crusted shut. Reports redness to right eye.

## 2023-01-26 ENCOUNTER — Other Ambulatory Visit (HOSPITAL_COMMUNITY): Payer: Self-pay

## 2023-01-26 ENCOUNTER — Other Ambulatory Visit: Payer: Self-pay

## 2023-01-27 ENCOUNTER — Other Ambulatory Visit (HOSPITAL_COMMUNITY): Payer: Self-pay

## 2023-02-15 ENCOUNTER — Other Ambulatory Visit (HOSPITAL_COMMUNITY): Payer: Self-pay

## 2023-02-18 ENCOUNTER — Other Ambulatory Visit: Payer: Self-pay

## 2023-03-04 ENCOUNTER — Other Ambulatory Visit: Payer: Self-pay

## 2023-03-04 DIAGNOSIS — D693 Immune thrombocytopenic purpura: Secondary | ICD-10-CM

## 2023-03-07 ENCOUNTER — Other Ambulatory Visit: Payer: Self-pay

## 2023-03-07 ENCOUNTER — Inpatient Hospital Stay: Payer: Medicaid Other | Attending: Hematology

## 2023-03-07 DIAGNOSIS — F419 Anxiety disorder, unspecified: Secondary | ICD-10-CM | POA: Diagnosis not present

## 2023-03-07 DIAGNOSIS — D693 Immune thrombocytopenic purpura: Secondary | ICD-10-CM | POA: Diagnosis present

## 2023-03-07 DIAGNOSIS — Z79890 Hormone replacement therapy (postmenopausal): Secondary | ICD-10-CM | POA: Insufficient documentation

## 2023-03-07 DIAGNOSIS — R739 Hyperglycemia, unspecified: Secondary | ICD-10-CM | POA: Diagnosis not present

## 2023-03-07 DIAGNOSIS — Z8719 Personal history of other diseases of the digestive system: Secondary | ICD-10-CM | POA: Diagnosis not present

## 2023-03-07 DIAGNOSIS — D509 Iron deficiency anemia, unspecified: Secondary | ICD-10-CM | POA: Insufficient documentation

## 2023-03-07 DIAGNOSIS — Z86010 Personal history of colonic polyps: Secondary | ICD-10-CM | POA: Insufficient documentation

## 2023-03-07 DIAGNOSIS — E039 Hypothyroidism, unspecified: Secondary | ICD-10-CM | POA: Insufficient documentation

## 2023-03-07 DIAGNOSIS — M1990 Unspecified osteoarthritis, unspecified site: Secondary | ICD-10-CM | POA: Insufficient documentation

## 2023-03-07 DIAGNOSIS — Z86711 Personal history of pulmonary embolism: Secondary | ICD-10-CM | POA: Insufficient documentation

## 2023-03-07 DIAGNOSIS — Z8049 Family history of malignant neoplasm of other genital organs: Secondary | ICD-10-CM | POA: Insufficient documentation

## 2023-03-07 DIAGNOSIS — Z79899 Other long term (current) drug therapy: Secondary | ICD-10-CM | POA: Diagnosis not present

## 2023-03-07 DIAGNOSIS — Z833 Family history of diabetes mellitus: Secondary | ICD-10-CM | POA: Insufficient documentation

## 2023-03-07 LAB — CMP (CANCER CENTER ONLY)
ALT: 23 U/L (ref 0–44)
AST: 20 U/L (ref 15–41)
Albumin: 3.9 g/dL (ref 3.5–5.0)
Alkaline Phosphatase: 89 U/L (ref 38–126)
Anion gap: 9 (ref 5–15)
BUN: 10 mg/dL (ref 6–20)
CO2: 28 mmol/L (ref 22–32)
Calcium: 9 mg/dL (ref 8.9–10.3)
Chloride: 103 mmol/L (ref 98–111)
Creatinine: 0.65 mg/dL (ref 0.44–1.00)
GFR, Estimated: >60 mL/min (ref >60)
Glucose, Bld: 213 mg/dL — ABNORMAL HIGH (ref 70–99)
Potassium: 4.1 mmol/L (ref 3.5–5.1)
Sodium: 140 mmol/L (ref 135–145)
Total Bilirubin: 0.5 mg/dL (ref 0.3–1.2)
Total Protein: 7.3 g/dL (ref 6.5–8.1)

## 2023-03-07 LAB — CBC WITH DIFFERENTIAL (CANCER CENTER ONLY)
Abs Immature Granulocytes: 0.02 10*3/uL (ref 0.00–0.07)
Basophils Absolute: 0 10*3/uL (ref 0.0–0.1)
Basophils Relative: 0 %
Eosinophils Absolute: 0.2 10*3/uL (ref 0.0–0.5)
Eosinophils Relative: 3 %
HCT: 40 % (ref 36.0–46.0)
Hemoglobin: 13.5 g/dL (ref 12.0–15.0)
Immature Granulocytes: 0 %
Lymphocytes Relative: 16 %
Lymphs Abs: 1 10*3/uL (ref 0.7–4.0)
MCH: 26.9 pg (ref 26.0–34.0)
MCHC: 33.8 g/dL (ref 30.0–36.0)
MCV: 79.8 fL — ABNORMAL LOW (ref 80.0–100.0)
Monocytes Absolute: 0.6 10*3/uL (ref 0.1–1.0)
Monocytes Relative: 10 %
Neutro Abs: 4.8 10*3/uL (ref 1.7–7.7)
Neutrophils Relative %: 71 %
Platelet Count: 120 10*3/uL — ABNORMAL LOW (ref 150–400)
RBC: 5.01 MIL/uL (ref 3.87–5.11)
RDW: 12.5 % (ref 11.5–15.5)
WBC Count: 6.7 10*3/uL (ref 4.0–10.5)
nRBC: 0 % (ref 0.0–0.2)

## 2023-03-07 LAB — IRON AND IRON BINDING CAPACITY (CC-WL,HP ONLY)
Iron: 51 ug/dL (ref 28–170)
Saturation Ratios: 13 % (ref 10.4–31.8)
TIBC: 405 ug/dL (ref 250–450)
UIBC: 354 ug/dL (ref 148–442)

## 2023-03-07 LAB — FERRITIN: Ferritin: 64 ng/mL (ref 11–307)

## 2023-03-08 ENCOUNTER — Inpatient Hospital Stay (HOSPITAL_BASED_OUTPATIENT_CLINIC_OR_DEPARTMENT_OTHER): Payer: Medicaid Other | Admitting: Hematology

## 2023-03-08 DIAGNOSIS — D509 Iron deficiency anemia, unspecified: Secondary | ICD-10-CM | POA: Diagnosis not present

## 2023-03-08 DIAGNOSIS — D693 Immune thrombocytopenic purpura: Secondary | ICD-10-CM

## 2023-03-08 NOTE — Progress Notes (Signed)
HEMATOLOGY/ONCOLOGY PHONE VISIT NOTE  Date of Service: 03/08/23   PCP: .Leilani Able, MD  CHIEF COMPLAINTS/PURPOSE OF CONSULTATION:  Follow-up for continued evaluation and management of chronic ITP  HISTORY OF PRESENTING ILLNESS:   Please see previous note for details of initial presentation  INTERVAL HISTORY: Kiara Barrera is a 47 y.o. female who was connected via phone for continued evaluation and management of her chronic ITP.  I connected with Kiara Barrera on 03/08/23 at  8:40 AM EDT by telephone visit and verified that I am speaking with the correct person using two identifiers.   Patient was last connected via phone on 12/28/2022 and she was doing well overall.   Patient notes she has been doing well overall without any new or severe medical concerns since our last visit. She reports that her menstrual cycle lasts for 8 days. She notes that her menstrual cycle is heavy for the first 1-2 days.   She has been tolerating her current dose of Promacta without any new or severe toxicities.    She denies any infection issues, fever, chills, night sweats, abnormal bleeding issues, abdominal pain, back pain, chest pain, or leg swelling.   Patient has been following-up with her PCP regarding elevated glucose levels.   Discussed lab results from 03/07/2023 in detail with the patient.  I discussed the limitations, risks, security and privacy concerns of performing an evaluation and management service by telemedicine and the availability of in-person appointments. I also discussed with the patient that there may be a patient responsible charge related to this service. The patient expressed understanding and agreed to proceed.   Other persons participating in the visit and their role in the encounter: none   Patient's location: home  Provider's location: [facility]   Chief Complaint: Follow-up for continued evaluation and management of chronic ITP   MEDICAL  HISTORY:  #1 chronic ITP #2 Iron deficiency anemia due to heavy periods #3 hypothyroidism #4 degenerative arthritis #5 anxiety disorder #6 history of pulmonary embolism in 2004 status post IVC filter placement. 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 #7 migraine headaches  SURGICAL HISTORY: Past Surgical History:  Procedure Laterality Date   BREAST BIOPSY Left    Pt believes she had lymphnode biopsy   C secton  2002   COLONOSCOPY     COLONOSCOPY WITH PROPOFOL N/A 02/07/2018   Procedure: COLONOSCOPY WITH PROPOFOL;  Surgeon: Napoleon Form, MD;  Location: [facility];  Service: Endoscopy;  Laterality: N/A;   COLONOSCOPY WITH PROPOFOL N/A 03/31/2022   Procedure: COLONOSCOPY WITH PROPOFOL;  Surgeon: Napoleon Form, MD;  Location: [facility];  Service: Gastroenterology;  Laterality: N/A;   IVC FILTER PLACEMENT ([facility] HX)  2004   Left axillary lymph node excision biopsy  2008   POLYPECTOMY  02/07/2018   Procedure: POLYPECTOMY;  Surgeon: Napoleon Form, MD;  Location: [facility];  Service: Endoscopy;;  Clips Placed    SOCIAL HISTORY: Social History   Socioeconomic History   Marital status: Single    Spouse name: Not on file   Number of children: Not on file   Years of education: Not on file   Highest education level: Not on file  Occupational History   Not on file  Tobacco Use   Smoking status: Never    Passive exposure: Past (lived with aunt who smoked in early 2000's, for  about one year)   Smokeless tobacco: Never  Vaping Use   Vaping status: Never Used  Substance and Sexual Activity   Alcohol use: Yes    Comment: occasional: 1-2x per times per year one drink   Drug use: Not Currently    Types: Marijuana    Comment: expirimented as a teen   Sexual activity: Yes    Partners: Male    Birth control/protection: None  Other Topics Concern   Not on file   Social History Narrative   Not on file   Social Determinants of Health   Financial Resource Strain: Not on file  Food Insecurity: Not on file  Transportation Needs: Not on file  Physical Activity: Not on file  Stress: Not on file  Social Connections: Not on file  Intimate Partner Violence: Not on file  Recently moved from [place], [place] to [place]. Nonsmoker minimal social alcohol use no drug use Currently unemployed Has a 72 year old daughter  FAMILY HISTORY: No known history of blood disorders Mother had a history of cervical cancer  maternal grandmother diabetes type 2 Maternal uncle diabetes type 2  ALLERGIES:  has No Known Allergies.  MEDICATIONS:  Current Outpatient Medications  Medication Sig Dispense Refill   acetaminophen (TYLENOL) 500 MG tablet Take 1,500 mg by mouth daily as needed for moderate pain.     Calcium Carb-Cholecalciferol (CALCIUM 600+D) 600-20 MG-MCG TABS Take 1 tablet by mouth daily.     Cholecalciferol (VITAMIN D3) 25 MCG (1000 UT) CAPS Take 1,000 Units by mouth daily.     eltrombopag (PROMACTA) 25 MG tablet Take 1 tablet (25mg ) by mouth 3 days a week on Monday, Wednesday and Friday. 30 tablet 11   ferrous sulfate 325 (65 FE) MG tablet Take 325 mg by mouth 2 (two) times daily.     hydrochlorothiazide (HYDRODIURIL) 25 MG tablet Take 25 mg by mouth daily.     levothyroxine (SYNTHROID) 88 MCG tablet Take 88 mcg by mouth daily before breakfast.     Multiple Vitamin (MULTIVITAMIN) tablet Take 1 tablet by mouth daily.     olopatadine (PATADAY) 0.1 % ophthalmic solution Place 1 drop into the right eye 2 (two) times daily. 5 mL 0   PEG-KCl-NaCl-NaSulf-Na Asc-C (PLENVU) 140 g SOLR Split dose prep following the doctor instruction 1 each 0   Turmeric 500 MG TABS Take 500 mg by mouth daily.     vitamin B-12 (CYANOCOBALAMIN) 1000 MCG tablet Take 1,000 mcg by mouth daily.     No current facility-administered medications for this visit.    REVIEW OF  SYSTEMS:    10 Point review of Systems was done is negative except as noted above.   PHYSICAL EXAMINATION: Telemedicine visit  LABORATORY DATA:  I have reviewed the data as listed  .    Latest Ref Rng & Units 03/07/2023    7:35 AM 12/27/2022    7:33 AM 10/25/2022    8:26 AM  CBC  WBC 4.0 - 10.5 K/uL 6.7  7.5  7.7   Hemoglobin 12.0 - 15.0 g/dL 16.1  09.6  04.5   Hematocrit 36.0 - 46.0 % 40.0  40.5  38.1   Platelets 150 - 400 K/uL 120  102  204        Latest Ref Rng & Units 03/07/2023    7:35 AM 12/27/2022    7:33 AM 10/25/2022    8:26 AM  CMP  Glucose 70 - 99 mg/dL 409  811  914   BUN 6 -  20 mg/dL 10  13  8    Creatinine 0.44 - 1.00 mg/dL 6.29  5.28  4.13   Sodium 135 - 145 mmol/L 140  136  138   Potassium 3.5 - 5.1 mmol/L 4.1  4.0  3.7   Chloride 98 - 111 mmol/L 103  103  103   CO2 22 - 32 mmol/L 28  25  27    Calcium 8.9 - 10.3 mg/dL 9.0  9.2  9.0   Total Protein 6.5 - 8.1 g/dL 7.3  7.4  6.8   Total Bilirubin 0.3 - 1.2 mg/dL 0.5  0.4  0.5   Alkaline Phos 38 - 126 U/L 89  89  68   AST 15 - 41 U/L 20  15  18    ALT 0 - 44 U/L 23  16  17     . Lab Results  Component Value Date   IRON 51 03/07/2023   TIBC 405 03/07/2023   IRONPCTSAT 13 03/07/2023  . (Iron and TIBC)  Lab Results  Component Value Date   FERRITIN 64 03/07/2023    RADIOGRAPHIC STUDIES: I have personally reviewed the radiological images as listed and agreed with the findings in the report. No results found.  ASSESSMENT & PLAN:   47 y.o. African-American female with   1) Chronic ITP since 2012 now with acute relapse of her ITP with very labile platelet counts. No overt bleeding at this time. Patient has been on and off steroids and has been on Promacta since 2016. Was apparently on Promacta 25mg  po MWF. No issues with bleeding at this time. No petechiae B12 wnl. Ultrasound abdomen 06/02/2016- did show some mild splenomegaly which could be an additional factor in her thrombocytopenia. The spleen  measures 12.9 x 13.9 x 6.7 cm. The calculated volume is 628 cc. Unclear etiology of splenomegaly. Liver appeared normal.  Patient's platelet counts have normalized after treatment with Rituxan weekly 4 doses with platelets improved temporarily but she needed additional treatment.   2) Iron deficiency Anemia - likely from heavy menstrual losses. -Tolerating the po iron without any significant issues.  Iron/TIBC/Ferritin/ %Sat    Component Value Date/Time   IRON 51 03/07/2023 0735   IRON 54 04/15/2017 0835   TIBC 405 03/07/2023 0735   TIBC 411 04/15/2017 0835   FERRITIN 64 03/07/2023 0735   FERRITIN 43 04/15/2017 0835   IRONPCTSAT 13 03/07/2023 0735   IRONPCTSAT 13 (L) 04/15/2017 0835    Plan  -Continue iron polysaccharide 150 mg.  P.o. daily.  3)  Hypothyroidism - on levothyroxine As per primary care physician -continue Mx per PCP  4) hyperglycemia-likely diabetes -Patient recommended to follow-up with primary care physician for further evaluation and management -Recommended lifestyle changes to focus on appropriate diet and weight loss  4) . Patient Active Problem List   Diagnosis Date Noted   Personal history of colonic polyps    Hx of adenomatous colonic polyps    Menorrhagia with irregular cycle 12/04/2020   Body mass index (BMI) of 50.0 to 59.9 in adult (HCC)    BRBPR (bright red blood per rectum)    Polyp of descending colon    Encntr for gyn exam (general) (routine) w/o abn findings 10/13/2016   Exposure to STD 10/13/2016   Chronic ITP (idiopathic thrombocytopenia) (HCC) 05/03/2016   Thrombocytopenia (HCC) 04/19/2016   Iron deficiency anemia 04/19/2016   Hypothyroidism 04/19/2016   Degenerative arthritis 04/19/2016   Generalized anxiety disorder 04/19/2016  -continue f/u with PCP  5) H/o PE  in 2004-- still has IVC filter in situ. Thought to be triggered by immobility and obesity. -not on anticoagulation at this time.   PLAN:  -Discussed lab results from  03/07/2023 with the patient. CBC shows decreased platelet counts of 120 K, but stable overall. CMP shows elevated glucose level of 213. Ferritin level in the normal range at 64. Iron saturation ratio 13%.  -We will not reduce Promacta dosage right now due to decreased platelet counts.  -Patient has been tolerating her current Promacta dosage well without any new or severe toxicities.  -Continue Promacta 25 mg 3 days a week: Monday Wednesday and Friday  -Recommend to follow-up with OBGYN for the option of IUD if needed.  -Recommend to follow-up with PCP regarding elevated glucose level.   FOLLOW-UP: Phone visit with Dr Candise Che in 3 months  Labs 1 days prior to phone visit  The total time spent in the appointment was 15 minutes* .  All of the patient's questions were answered with apparent satisfaction. The patient knows to call the clinic with any problems, questions or concerns.   Wyvonnia Lora MD MS AAHIVMS [facility] Hematology/Oncology Physician [facility]  .*Total Encounter Time as defined by the Centers for Medicare and Medicaid Services includes, in addition to the face-to-face time of a patient visit (documented in the note above) non-face-to-face time: obtaining and reviewing outside history, ordering and reviewing medications, tests or procedures, care coordination (communications with other health care professionals or caregivers) and documentation in the medical record.   I,Param Shah,acting as a Neurosurgeon for Wyvonnia Lora, MD.,have documented all relevant documentation on the behalf of Wyvonnia Lora, MD,as directed by  Wyvonnia Lora, MD while in the presence of Wyvonnia Lora, MD.    .I have reviewed the above documentation for accuracy and completeness, and I agree with the above. Johney Maine MD

## 2023-03-16 ENCOUNTER — Other Ambulatory Visit (HOSPITAL_COMMUNITY): Payer: Self-pay

## 2023-04-04 ENCOUNTER — Other Ambulatory Visit (HOSPITAL_COMMUNITY): Payer: Self-pay

## 2023-04-06 ENCOUNTER — Other Ambulatory Visit (HOSPITAL_COMMUNITY): Payer: Self-pay

## 2023-04-08 ENCOUNTER — Other Ambulatory Visit: Payer: Self-pay

## 2023-05-03 ENCOUNTER — Other Ambulatory Visit (HOSPITAL_COMMUNITY): Payer: Self-pay

## 2023-05-06 ENCOUNTER — Encounter (HOSPITAL_COMMUNITY): Payer: Self-pay

## 2023-05-06 ENCOUNTER — Other Ambulatory Visit (HOSPITAL_COMMUNITY): Payer: Self-pay

## 2023-05-09 ENCOUNTER — Other Ambulatory Visit: Payer: Self-pay

## 2023-05-11 ENCOUNTER — Other Ambulatory Visit: Payer: Self-pay

## 2023-05-11 NOTE — Progress Notes (Signed)
Specialty Pharmacy Refill Coordination Note  Kiara Barrera is a 47 y.o. female contacted today regarding refills of specialty medication(s) Eltrombopag Olamine   Patient requested Delivery   Delivery date: 05/13/23   Verified address: [name] [address]   Medication will be filled on 05/12/23.

## 2023-05-26 ENCOUNTER — Other Ambulatory Visit: Payer: Self-pay | Admitting: *Deleted

## 2023-05-26 DIAGNOSIS — D693 Immune thrombocytopenic purpura: Secondary | ICD-10-CM

## 2023-05-30 ENCOUNTER — Inpatient Hospital Stay: Payer: Medicaid Other | Attending: Hematology

## 2023-05-30 DIAGNOSIS — R739 Hyperglycemia, unspecified: Secondary | ICD-10-CM | POA: Diagnosis not present

## 2023-05-30 DIAGNOSIS — R5383 Other fatigue: Secondary | ICD-10-CM | POA: Insufficient documentation

## 2023-05-30 DIAGNOSIS — D693 Immune thrombocytopenic purpura: Secondary | ICD-10-CM | POA: Insufficient documentation

## 2023-05-30 DIAGNOSIS — Z79890 Hormone replacement therapy (postmenopausal): Secondary | ICD-10-CM | POA: Insufficient documentation

## 2023-05-30 DIAGNOSIS — M1990 Unspecified osteoarthritis, unspecified site: Secondary | ICD-10-CM | POA: Diagnosis not present

## 2023-05-30 DIAGNOSIS — D259 Leiomyoma of uterus, unspecified: Secondary | ICD-10-CM | POA: Diagnosis not present

## 2023-05-30 DIAGNOSIS — Z86711 Personal history of pulmonary embolism: Secondary | ICD-10-CM | POA: Diagnosis not present

## 2023-05-30 DIAGNOSIS — G43909 Migraine, unspecified, not intractable, without status migrainosus: Secondary | ICD-10-CM | POA: Insufficient documentation

## 2023-05-30 DIAGNOSIS — Z8049 Family history of malignant neoplasm of other genital organs: Secondary | ICD-10-CM | POA: Insufficient documentation

## 2023-05-30 DIAGNOSIS — Z833 Family history of diabetes mellitus: Secondary | ICD-10-CM | POA: Insufficient documentation

## 2023-05-30 DIAGNOSIS — E039 Hypothyroidism, unspecified: Secondary | ICD-10-CM | POA: Insufficient documentation

## 2023-05-30 DIAGNOSIS — Z79899 Other long term (current) drug therapy: Secondary | ICD-10-CM | POA: Diagnosis not present

## 2023-05-30 DIAGNOSIS — Z860101 Personal history of adenomatous and serrated colon polyps: Secondary | ICD-10-CM | POA: Insufficient documentation

## 2023-05-30 DIAGNOSIS — F419 Anxiety disorder, unspecified: Secondary | ICD-10-CM | POA: Diagnosis not present

## 2023-05-30 DIAGNOSIS — N920 Excessive and frequent menstruation with regular cycle: Secondary | ICD-10-CM | POA: Insufficient documentation

## 2023-05-30 DIAGNOSIS — D500 Iron deficiency anemia secondary to blood loss (chronic): Secondary | ICD-10-CM | POA: Diagnosis not present

## 2023-05-30 LAB — CMP (CANCER CENTER ONLY)
ALT: 18 U/L (ref 0–44)
AST: 16 U/L (ref 15–41)
Albumin: 3.8 g/dL (ref 3.5–5.0)
Alkaline Phosphatase: 97 U/L (ref 38–126)
Anion gap: 8 (ref 5–15)
BUN: 14 mg/dL (ref 6–20)
CO2: 28 mmol/L (ref 22–32)
Calcium: 9.2 mg/dL (ref 8.9–10.3)
Chloride: 100 mmol/L (ref 98–111)
Creatinine: 0.64 mg/dL (ref 0.44–1.00)
GFR, Estimated: >60 mL/min (ref >60)
Glucose, Bld: 270 mg/dL — ABNORMAL HIGH (ref 70–99)
Potassium: 3.9 mmol/L (ref 3.5–5.1)
Sodium: 136 mmol/L (ref 135–145)
Total Bilirubin: 0.4 mg/dL (ref <1.2)
Total Protein: 7.1 g/dL (ref 6.5–8.1)

## 2023-05-30 LAB — CBC WITH DIFFERENTIAL (CANCER CENTER ONLY)
Abs Immature Granulocytes: 0.09 10*3/uL — ABNORMAL HIGH (ref 0.00–0.07)
Basophils Absolute: 0 10*3/uL (ref 0.0–0.1)
Basophils Relative: 0 %
Eosinophils Absolute: 0.3 10*3/uL (ref 0.0–0.5)
Eosinophils Relative: 4 %
HCT: 37.7 % (ref 36.0–46.0)
Hemoglobin: 12.8 g/dL (ref 12.0–15.0)
Immature Granulocytes: 1 %
Lymphocytes Relative: 16 %
Lymphs Abs: 1.2 10*3/uL (ref 0.7–4.0)
MCH: 26.7 pg (ref 26.0–34.0)
MCHC: 34 g/dL (ref 30.0–36.0)
MCV: 78.7 fL — ABNORMAL LOW (ref 80.0–100.0)
Monocytes Absolute: 0.7 10*3/uL (ref 0.1–1.0)
Monocytes Relative: 9 %
Neutro Abs: 5.3 10*3/uL (ref 1.7–7.7)
Neutrophils Relative %: 70 %
Platelet Count: 113 10*3/uL — ABNORMAL LOW (ref 150–400)
RBC: 4.79 MIL/uL (ref 3.87–5.11)
RDW: 12.5 % (ref 11.5–15.5)
WBC Count: 7.6 10*3/uL (ref 4.0–10.5)
nRBC: 0 % (ref 0.0–0.2)

## 2023-05-30 LAB — IRON AND IRON BINDING CAPACITY (CC-WL,HP ONLY)
Iron: 44 ug/dL (ref 28–170)
Saturation Ratios: 10 % — ABNORMAL LOW (ref 10.4–31.8)
TIBC: 426 ug/dL (ref 250–450)
UIBC: 382 ug/dL (ref 148–442)

## 2023-05-30 LAB — FERRITIN: Ferritin: 45 ng/mL (ref 11–307)

## 2023-05-31 ENCOUNTER — Other Ambulatory Visit: Payer: Self-pay

## 2023-05-31 NOTE — Progress Notes (Signed)
Specialty Pharmacy Refill Coordination Note  Kathaline Cochenour is a 47 y.o. female contacted today regarding refills of specialty medication(s) Eltrombopag Olamine   Patient requested Delivery   Delivery date: 06/08/23   Verified address: [address] [name] [id number]   Medication will be filled on 06/07/23.

## 2023-06-01 ENCOUNTER — Inpatient Hospital Stay (HOSPITAL_BASED_OUTPATIENT_CLINIC_OR_DEPARTMENT_OTHER): Payer: Medicaid Other | Admitting: Hematology

## 2023-06-01 DIAGNOSIS — D509 Iron deficiency anemia, unspecified: Secondary | ICD-10-CM | POA: Diagnosis not present

## 2023-06-01 DIAGNOSIS — D693 Immune thrombocytopenic purpura: Secondary | ICD-10-CM | POA: Diagnosis not present

## 2023-06-01 NOTE — Progress Notes (Signed)
HEMATOLOGY/ONCOLOGY PHONE VISIT NOTE  Date of Service: 06/01/23   PCP: .Leilani Able, MD  CHIEF COMPLAINTS/PURPOSE OF CONSULTATION:  Follow-up for continued evaluation and management of chronic ITP  HISTORY OF PRESENTING ILLNESS:   Please see previous note for details of initial presentation  INTERVAL HISTORY: Kiara Barrera is a 47 y.o. female who was connected via phone for continued evaluation and management of her chronic ITP.  I had a phone visit with [facility] on 03/08/2023 and she reported heaviness in her menstrual cycles during the first 1-2 days. She was otherwise doing well overall.   I connected with Azzure Swinton Laidler on 11/13/24at  8:40 AM EST by telephone visit and verified that I am speaking with the correct person using two identifiers.   I discussed the limitations, risks, security and privacy concerns of performing an evaluation and management service by telemedicine and the availability of in-person appointments. I also discussed with the patient that there may be a patient responsible charge related to this service. The patient expressed understanding and agreed to proceed.   Other persons participating in the visit and their role in the encounter: none   Patient's location: home  Provider's location: [facility]   Chief Complaint: Follow-up for continued evaluation and management of chronic ITP   Today, she reports that she has been doing well overall. Patient reports heavy periods because of fibroids and is planning to see her PCP next Monday to address this.   She was also found to have elevated blood sugar level at 270. We discussed that her blood sugar has been running somewhat high with our labs and there is a likelihood of DM. She shall connect with her PCP next Monday for further evaluation.   She has no other abnormal bleeding issues.   MEDICAL HISTORY:  #1 chronic ITP #2 Iron deficiency anemia due to heavy periods #3  hypothyroidism #4 degenerative arthritis #5 anxiety disorder #6 history of pulmonary embolism in 2004 status post IVC filter placement. 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 #7 migraine headaches  SURGICAL HISTORY: Past Surgical History:  Procedure Laterality Date   BREAST BIOPSY Left    Pt believes she had lymphnode biopsy   C secton  2002   COLONOSCOPY     COLONOSCOPY WITH PROPOFOL N/A 02/07/2018   Procedure: COLONOSCOPY WITH PROPOFOL;  Surgeon: Napoleon Form, MD;  [facility]: [facility];  Service: Endoscopy;  Laterality: N/A;   COLONOSCOPY WITH PROPOFOL N/A 03/31/2022   Procedure: COLONOSCOPY WITH PROPOFOL;  Surgeon: Napoleon Form, MD;  Location: [facility];  Service: Gastroenterology;  Laterality: N/A;   IVC FILTER PLACEMENT ([facility] HX)  2004   Left axillary lymph node excision biopsy  2008   POLYPECTOMY  02/07/2018   Procedure: POLYPECTOMY;  Surgeon: Napoleon Form, MD;  [facility]: [facility];  Service: Endoscopy;;  Clips Placed    SOCIAL HISTORY: Social History   Socioeconomic History   Marital status: Single    Spouse name: Not on file   Number of children: Not on file   Years of education: Not on file   Highest education level: Not on file  Occupational History   Not on file  Tobacco Use   Smoking status: Never    Passive exposure: Past (lived with aunt who smoked in early 2000's, for about one year)   Smokeless tobacco: Never  Vaping Use   Vaping status: Never Used  Substance and Sexual Activity   Alcohol use: Yes    Comment: occasional: 1-2x per times per year one drink   Drug use: Not Currently    Types: Marijuana    Comment: expirimented as a teen   Sexual activity: Yes    Partners: Male    Birth control/protection: None  Other Topics Concern   Not on file  Social History Narrative   Not on file   Social Determinants of Health    Financial Resource Strain: Not on file  Food Insecurity: Not on file  Transportation Needs: Not on file  Physical Activity: Not on file  Stress: Not on file  Social Connections: Not on file  Intimate Partner Violence: Not on file  Recently moved from [place], [place] to [place]. Nonsmoker minimal social alcohol use no drug use Currently unemployed Has a 87 year old daughter  FAMILY HISTORY: No known history of blood disorders Mother had a history of cervical cancer  maternal grandmother diabetes type 2 Maternal uncle diabetes type 2  ALLERGIES:  has No Known Allergies.  MEDICATIONS:  Current Outpatient Medications  Medication Sig Dispense Refill   acetaminophen (TYLENOL) 500 MG tablet Take 1,500 mg by mouth daily as needed for moderate pain.     Calcium Carb-Cholecalciferol (CALCIUM 600+D) 600-20 MG-MCG TABS Take 1 tablet by mouth daily.     Cholecalciferol (VITAMIN D3) 25 MCG (1000 UT) CAPS Take 1,000 Units by mouth daily.     eltrombopag (PROMACTA) 25 MG tablet Take 1 tablet (25mg ) by mouth 3 days a week on Monday, Wednesday and Friday. 30 tablet 11   ferrous sulfate 325 (65 FE) MG tablet Take 325 mg by mouth 2 (two) times daily.     hydrochlorothiazide (HYDRODIURIL) 25 MG tablet Take 25 mg by mouth daily.     levothyroxine (SYNTHROID) 88 MCG tablet Take 88 mcg by mouth daily before breakfast.     Multiple Vitamin (MULTIVITAMIN) tablet Take 1 tablet by mouth daily.     olopatadine (PATADAY) 0.1 % ophthalmic solution Place 1 drop into the right eye 2 (two) times daily. 5 mL 0   PEG-KCl-NaCl-NaSulf-Na Asc-C (PLENVU) 140 g SOLR Split dose prep following the doctor instruction 1 each 0   Turmeric 500 MG TABS Take 500 mg by mouth daily.     vitamin B-12 (CYANOCOBALAMIN) 1000 MCG tablet Take 1,000 mcg by mouth daily.     No current facility-administered medications for this visit.    REVIEW OF SYSTEMS:    10 Point review of Systems was done is negative except as noted  above.   PHYSICAL EXAMINATION: Telemedicine visit  LABORATORY DATA:  I have reviewed the data as listed  .    Latest Ref Rng & Units 05/30/2023    7:35 AM 03/07/2023    7:35 AM 12/27/2022    7:33 AM  CBC  WBC 4.0 - 10.5 K/uL 7.6  6.7  7.5   Hemoglobin 12.0 - 15.0 g/dL 86.5  78.4  69.6   Hematocrit 36.0 - 46.0 % 37.7  40.0  40.5   Platelets 150 - 400 K/uL 113  120  102        Latest Ref Rng & Units 05/30/2023    7:35 AM 03/07/2023    7:35 AM 12/27/2022    7:33 AM  CMP  Glucose 70 - 99 mg/dL 295  284  132   BUN 6 - 20 mg/dL 14  10  13  Creatinine 0.44 - 1.00 mg/dL 4.09  8.11  9.14   Sodium 135 - 145 mmol/L 136  140  136   Potassium 3.5 - 5.1 mmol/L 3.9  4.1  4.0   Chloride 98 - 111 mmol/L 100  103  103   CO2 22 - 32 mmol/L 28  28  25    Calcium 8.9 - 10.3 mg/dL 9.2  9.0  9.2   Total Protein 6.5 - 8.1 g/dL 7.1  7.3  7.4   Total Bilirubin <1.2 mg/dL 0.4  0.5  0.4   Alkaline Phos 38 - 126 U/L 97  89  89   AST 15 - 41 U/L 16  20  15    ALT 0 - 44 U/L 18  23  16     . Lab Results  Component Value Date   IRON 44 05/30/2023   TIBC 426 05/30/2023   IRONPCTSAT 10 (L) 05/30/2023  . (Iron and TIBC)  Lab Results  Component Value Date   FERRITIN 45 05/30/2023    RADIOGRAPHIC STUDIES: I have personally reviewed the radiological images as listed and agreed with the findings in the report. No results found.  ASSESSMENT & PLAN:   47 y.o. African-American female with   1) Chronic ITP since 2012 now with acute relapse of her ITP with very labile platelet counts. No overt bleeding at this time. Patient has been on and off steroids and has been on Promacta since 2016. Was apparently on Promacta 25mg  po MWF. No issues with bleeding at this time. No petechiae B12 wnl. Ultrasound abdomen 06/02/2016- did show some mild splenomegaly which could be an additional factor in her thrombocytopenia. The spleen measures 12.9 x 13.9 x 6.7 cm. The calculated volume is 628 cc. Unclear  etiology of splenomegaly. Liver appeared normal.  Patient's platelet counts have normalized after treatment with Rituxan weekly 4 doses with platelets improved temporarily but she needed additional treatment.   2) Iron deficiency Anemia - likely from heavy menstrual losses. -Tolerating the po iron without any significant issues.  Iron/TIBC/Ferritin/ %Sat    Component Value Date/Time   IRON 44 05/30/2023 0735   IRON 54 04/15/2017 0835   TIBC 426 05/30/2023 0735   TIBC 411 04/15/2017 0835   FERRITIN 45 05/30/2023 0735   FERRITIN 43 04/15/2017 0835   IRONPCTSAT 10 (L) 05/30/2023 0735   IRONPCTSAT 13 (L) 04/15/2017 0835    Plan  -Continue iron polysaccharide 150 mg.  P.o. daily.  3)  Hypothyroidism - on levothyroxine As per primary care physician -continue Mx per PCP  4) hyperglycemia-likely diabetes -Patient recommended to follow-up with primary care physician for further evaluation and management -Recommended lifestyle changes to focus on appropriate diet and weight loss  4) . Patient Active Problem List   Diagnosis Date Noted   History of colonic polyps    Hx of adenomatous colonic polyps    Menorrhagia with irregular cycle 12/04/2020   Body mass index (BMI) of 50.0 to 59.9 in adult (HCC)    BRBPR (bright red blood per rectum)    Polyp of descending colon    Encntr for gyn exam (general) (routine) w/o abn findings 10/13/2016   Exposure to STD 10/13/2016   Chronic ITP (idiopathic thrombocytopenia) (HCC) 05/03/2016   Thrombocytopenia (HCC) 04/19/2016   Iron deficiency anemia 04/19/2016   Hypothyroidism 04/19/2016   Degenerative arthritis 04/19/2016   Generalized anxiety disorder 04/19/2016  -continue f/u with PCP  5) H/o PE in 2004-- still has IVC filter in situ. Thought to  be triggered by immobility and obesity. -not on anticoagulation at this time.   PLAN:   -Discussed lab results from 05/30/2023 in detail with patient. CBC showed WBC of 7.6K, hemoglobin of  12.8, and platelets of 113K. -Platelets are stable at 113K.  -No anemia. Hgb low at baseline 12.8 -Iron labs showed that patient was still iron deficient with ongoing heavy bleeding -She reports increased fatigue and will talk to her PCP to discuss a plan for controlling her periods plan will let us know if she is inclined to consider IV iron.  -In the meanwhile, she shall continue iron polysaccharide 150 mg orally twice daily -Patient has been tolerating her current Promacta dosage well without any new or severe toxicities.  -In regards to her ITP, she shall continue Continue Promacta 25 mg 3 days a week: Monday Wednesday and Friday  -We shall see her back in 3 months  FOLLOW-UP: Phone visit with Dr Candise Che in 3 months  Labs 1 days prior to phone visit  The total time spent in the appointment was 20 minutes* .  All of the patient's questions were answered with apparent satisfaction. The patient knows to call the clinic with any problems, questions or concerns.   Wyvonnia Lora MD MS AAHIVMS [facility] Hematology/Oncology Physician [facility]  .*Total Encounter Time as defined by the Centers for Medicare and Medicaid Services includes, in addition to the face-to-face time of a patient visit (documented in the note above) non-face-to-face time: obtaining and reviewing outside history, ordering and reviewing medications, tests or procedures, care coordination (communications with other health care professionals or caregivers) and documentation in the medical record.     I,Mitra Faeizi,acting as a Neurosurgeon for Wyvonnia Lora, MD.,have documented all relevant documentation on the behalf of Wyvonnia Lora, MD,as directed by  Wyvonnia Lora, MD while in the presence of Wyvonnia Lora, MD.   .I have reviewed the above documentation for accuracy and completeness, and I agree with the above. Johney Maine MD

## 2023-06-02 ENCOUNTER — Telehealth: Payer: Self-pay | Admitting: Hematology

## 2023-06-02 NOTE — Telephone Encounter (Signed)
Patient is aware of scheduled appointment times/dates

## 2023-06-07 ENCOUNTER — Other Ambulatory Visit: Payer: Self-pay

## 2023-06-24 ENCOUNTER — Other Ambulatory Visit: Payer: Self-pay

## 2023-06-24 NOTE — Progress Notes (Signed)
Specialty Pharmacy Refill Coordination Note  Kiara Barrera is a 47 y.o. female contacted today regarding refills of specialty medication(s) Eltrombopag Olamine   Patient requested Delivery   Delivery date: 07/01/23   Verified address: [address]   Medication will be filled on 06/30/23.

## 2023-06-24 NOTE — Progress Notes (Signed)
Specialty Pharmacy Ongoing Clinical Assessment Note  Kiara Barrera is a 46 y.o. female who is being followed by the specialty pharmacy service for RxSp Bleeding Disorders   Patient's specialty medication(s) reviewed today: Eltrombopag Olamine   Missed doses in the last 4 weeks: 0   Patient/Caregiver did not have any additional questions or concerns.   Therapeutic benefit summary: Patient is achieving benefit   Adverse events/side effects summary: No adverse events/side effects   Patient's therapy is appropriate to: Continue    Goals Addressed             This Visit's Progress    Stabilization of disease       Patient is on track. Patient will maintain adherence         Follow up:  6 months  Bobette Mo Specialty Pharmacist

## 2023-06-30 ENCOUNTER — Other Ambulatory Visit: Payer: Self-pay

## 2023-07-21 ENCOUNTER — Other Ambulatory Visit: Payer: Self-pay

## 2023-07-22 ENCOUNTER — Other Ambulatory Visit: Payer: Self-pay

## 2023-07-25 ENCOUNTER — Other Ambulatory Visit: Payer: Self-pay

## 2023-08-01 ENCOUNTER — Other Ambulatory Visit: Payer: Self-pay

## 2023-08-01 NOTE — Progress Notes (Signed)
 Specialty Pharmacy Refill Coordination Note  Kiara Barrera  is a 48 y.o. female contacted today regarding refills of specialty medication(s) Eltrombopag  Olamine (PROMACTA )   Patient requested Delivery   Delivery date: 08/04/23   Verified address: [name]   Medication will be filled on 08/03/23.

## 2023-08-01 NOTE — Progress Notes (Signed)
 Clinical Intervention Note  Clinical Intervention Notes: Patient reported starting Metformin & Lantus. Per Up to Date,  no DDI with Promacta . No further intervention required.   Clinical Intervention Outcomes: Prevention of an adverse drug event   Mitzie GORMAN Colt Specialty Pharmacist

## 2023-08-12 ENCOUNTER — Other Ambulatory Visit: Payer: Self-pay | Admitting: Family Medicine

## 2023-08-12 DIAGNOSIS — Z1231 Encounter for screening mammogram for malignant neoplasm of breast: Secondary | ICD-10-CM

## 2023-08-23 ENCOUNTER — Other Ambulatory Visit: Payer: Self-pay

## 2023-08-26 ENCOUNTER — Other Ambulatory Visit (HOSPITAL_COMMUNITY): Payer: Self-pay

## 2023-08-26 ENCOUNTER — Other Ambulatory Visit: Payer: Self-pay

## 2023-08-26 DIAGNOSIS — D509 Iron deficiency anemia, unspecified: Secondary | ICD-10-CM

## 2023-08-26 DIAGNOSIS — D693 Immune thrombocytopenic purpura: Secondary | ICD-10-CM

## 2023-08-29 ENCOUNTER — Inpatient Hospital Stay: Payer: Medicaid Other | Attending: Hematology

## 2023-08-29 ENCOUNTER — Other Ambulatory Visit: Payer: Self-pay

## 2023-08-29 DIAGNOSIS — M1990 Unspecified osteoarthritis, unspecified site: Secondary | ICD-10-CM | POA: Insufficient documentation

## 2023-08-29 DIAGNOSIS — D693 Immune thrombocytopenic purpura: Secondary | ICD-10-CM | POA: Insufficient documentation

## 2023-08-29 DIAGNOSIS — Z8049 Family history of malignant neoplasm of other genital organs: Secondary | ICD-10-CM | POA: Insufficient documentation

## 2023-08-29 DIAGNOSIS — E039 Hypothyroidism, unspecified: Secondary | ICD-10-CM | POA: Diagnosis not present

## 2023-08-29 DIAGNOSIS — E1165 Type 2 diabetes mellitus with hyperglycemia: Secondary | ICD-10-CM | POA: Diagnosis not present

## 2023-08-29 DIAGNOSIS — Z833 Family history of diabetes mellitus: Secondary | ICD-10-CM | POA: Diagnosis not present

## 2023-08-29 DIAGNOSIS — G43909 Migraine, unspecified, not intractable, without status migrainosus: Secondary | ICD-10-CM | POA: Diagnosis not present

## 2023-08-29 DIAGNOSIS — F419 Anxiety disorder, unspecified: Secondary | ICD-10-CM | POA: Diagnosis not present

## 2023-08-29 DIAGNOSIS — D509 Iron deficiency anemia, unspecified: Secondary | ICD-10-CM

## 2023-08-29 DIAGNOSIS — Z86711 Personal history of pulmonary embolism: Secondary | ICD-10-CM | POA: Insufficient documentation

## 2023-08-29 DIAGNOSIS — Z7984 Long term (current) use of oral hypoglycemic drugs: Secondary | ICD-10-CM | POA: Insufficient documentation

## 2023-08-29 DIAGNOSIS — N920 Excessive and frequent menstruation with regular cycle: Secondary | ICD-10-CM | POA: Diagnosis not present

## 2023-08-29 DIAGNOSIS — Z860101 Personal history of adenomatous and serrated colon polyps: Secondary | ICD-10-CM | POA: Diagnosis not present

## 2023-08-29 DIAGNOSIS — Z79899 Other long term (current) drug therapy: Secondary | ICD-10-CM | POA: Insufficient documentation

## 2023-08-29 DIAGNOSIS — D500 Iron deficiency anemia secondary to blood loss (chronic): Secondary | ICD-10-CM | POA: Diagnosis not present

## 2023-08-29 LAB — CMP (CANCER CENTER ONLY)
ALT: 22 U/L (ref 0–44)
AST: 19 U/L (ref 15–41)
Albumin: 3.9 g/dL (ref 3.5–5.0)
Alkaline Phosphatase: 84 U/L (ref 38–126)
Anion gap: 7 (ref 5–15)
BUN: 11 mg/dL (ref 6–20)
CO2: 26 mmol/L (ref 22–32)
Calcium: 9 mg/dL (ref 8.9–10.3)
Chloride: 106 mmol/L (ref 98–111)
Creatinine: 0.56 mg/dL (ref 0.44–1.00)
GFR, Estimated: >60 mL/min (ref >60)
Glucose, Bld: 175 mg/dL — ABNORMAL HIGH (ref 70–99)
Potassium: 3.8 mmol/L (ref 3.5–5.1)
Sodium: 139 mmol/L (ref 135–145)
Total Bilirubin: 0.4 mg/dL (ref 0.0–1.2)
Total Protein: 6.8 g/dL (ref 6.5–8.1)

## 2023-08-29 LAB — CBC WITH DIFFERENTIAL (CANCER CENTER ONLY)
Abs Immature Granulocytes: 0.02 10*3/uL (ref 0.00–0.07)
Basophils Absolute: 0 10*3/uL (ref 0.0–0.1)
Basophils Relative: 0 %
Eosinophils Absolute: 0.2 10*3/uL (ref 0.0–0.5)
Eosinophils Relative: 3 %
HCT: 37.5 % (ref 36.0–46.0)
Hemoglobin: 12.4 g/dL (ref 12.0–15.0)
Immature Granulocytes: 0 %
Lymphocytes Relative: 21 %
Lymphs Abs: 1.1 10*3/uL (ref 0.7–4.0)
MCH: 26.1 pg (ref 26.0–34.0)
MCHC: 33.1 g/dL (ref 30.0–36.0)
MCV: 78.8 fL — ABNORMAL LOW (ref 80.0–100.0)
Monocytes Absolute: 0.5 10*3/uL (ref 0.1–1.0)
Monocytes Relative: 10 %
Neutro Abs: 3.4 10*3/uL (ref 1.7–7.7)
Neutrophils Relative %: 66 %
Platelet Count: 190 10*3/uL (ref 150–400)
RBC: 4.76 MIL/uL (ref 3.87–5.11)
RDW: 13.8 % (ref 11.5–15.5)
WBC Count: 5.3 10*3/uL (ref 4.0–10.5)
nRBC: 0 % (ref 0.0–0.2)

## 2023-08-29 LAB — IRON AND IRON BINDING CAPACITY (CC-WL,HP ONLY)
Iron: 63 ug/dL (ref 28–170)
Saturation Ratios: 16 % (ref 10.4–31.8)
TIBC: 400 ug/dL (ref 250–450)
UIBC: 337 ug/dL (ref 148–442)

## 2023-08-29 LAB — FERRITIN: Ferritin: 35 ng/mL (ref 11–307)

## 2023-08-29 NOTE — Progress Notes (Signed)
 Specialty Pharmacy Refill Coordination Note  Kiara Barrera  is a 47 y.o. female contacted today regarding refills of specialty medication(s) Eltrombopag  Olamine (PROMACTA )   Patient requested Delivery   Delivery date: 08/31/23   Verified address: [address] [name] [id number]   Medication will be filled on 08/30/23.

## 2023-08-30 ENCOUNTER — Other Ambulatory Visit: Payer: Self-pay

## 2023-08-31 ENCOUNTER — Inpatient Hospital Stay (HOSPITAL_BASED_OUTPATIENT_CLINIC_OR_DEPARTMENT_OTHER): Payer: Medicaid Other | Admitting: Hematology

## 2023-08-31 ENCOUNTER — Other Ambulatory Visit: Payer: Self-pay

## 2023-08-31 ENCOUNTER — Other Ambulatory Visit (HOSPITAL_COMMUNITY): Payer: Self-pay

## 2023-08-31 DIAGNOSIS — D693 Immune thrombocytopenic purpura: Secondary | ICD-10-CM | POA: Diagnosis not present

## 2023-08-31 MED ORDER — ELTROMBOPAG OLAMINE 25 MG PO TABS
ORAL_TABLET | ORAL | 11 refills | Status: DC
  Filled 2023-08-31: qty 30, fill #0
  Filled 2023-09-23: qty 8, 28d supply, fill #0
  Filled 2023-11-07: qty 8, 28d supply, fill #1
  Filled 2023-12-07: qty 8, 28d supply, fill #2

## 2023-08-31 NOTE — Progress Notes (Signed)
 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 female who was connected via phone for continued evaluation and management of her chronic ITP.  I last had a phone visit with [facility] on 06/01/2023 and she complained of heavy periods due to fibroids. Patient was found to have elevated blood sugar level of 270.   I connected with Kiara Barrera on 08/31/23 at  8:40 AM EST by telephone visit and verified that I am speaking with the correct person using two identifiers.   I discussed the limitations, risks, security and privacy concerns of performing an evaluation and management service by telemedicine and the availability of in-person appointments. I also discussed with the patient that there may be a patient responsible charge related to this service. The patient expressed understanding and agreed to proceed.   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   Patient did follow-up with her PCP and notes that she has been started on insulin and metformin for her newly diagnosed Diabetes Mellitus. She has no other acute new issues otherwise.   She is currently taking 25 MG Promacta three days a week, MWF. Patient has been tolerating Promacta well with no new or severe toxicity issues.   She continues to take oral iron replacement for iron deficiency.    The results of her recent lab workup was discussed with her in detail.  MEDICAL HISTORY:  #1 chronic ITP #2 Iron deficiency anemia due to heavy periods #3 hypothyroidism #4 degenerative arthritis #5 anxiety disorder #6 history of pulmonary embolism in 2004 status post IVC filter placement. 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 #7 migraine headaches  SURGICAL HISTORY: Past Surgical History:  Procedure Laterality Date   BREAST BIOPSY Left    Pt believes she had lymphnode biopsy   C secton  2002   COLONOSCOPY     COLONOSCOPY WITH PROPOFOL N/A 02/07/2018   Procedure: COLONOSCOPY WITH PROPOFOL;  Surgeon: Napoleon Form, MD;  Location: [facility];  Service: Endoscopy;  Laterality: N/A;   COLONOSCOPY WITH PROPOFOL N/A 03/31/2022   Procedure: COLONOSCOPY WITH PROPOFOL;  Surgeon: Napoleon Form, MD;  Location: [facility];  Service: Gastroenterology;  Laterality: N/A;   IVC FILTER PLACEMENT ([facility] HX)  2004   Left axillary lymph node excision biopsy  2008   POLYPECTOMY  02/07/2018   Procedure: POLYPECTOMY;  Surgeon: Napoleon Form, MD;  Location: [facility];  Service: Endoscopy;;  Clips Placed    SOCIAL HISTORY: Social History   Socioeconomic History   Marital status: Single    Spouse name: Not on file   Number of children: Not on file   Years of education: Not on file   Highest education level: Not on file  Occupational History   Not on file  Tobacco Use  Smoking status: Never    Passive exposure: Past (lived with aunt who smoked in early 2000's, for about one year)   Smokeless tobacco: Never  Vaping Use   Vaping status: Never Used  Substance and Sexual Activity   Alcohol use: Yes    Comment: occasional: 1-2x per times per year one drink   Drug use: Not Currently    Types: Marijuana    Comment: expirimented as a teen   Sexual activity: Yes     Partners: Male    Birth control/protection: None  Other Topics Concern   Not on file  Social History Narrative   Not on file   Social Drivers of Health   Financial Resource Strain: Not on file  Food Insecurity: Not on file  Transportation Needs: Not on file  Physical Activity: Not on file  Stress: Not on file  Social Connections: Not on file  Intimate Partner Violence: Not on file  Recently moved from [place], [place] to [place]. Nonsmoker minimal social alcohol use no drug use Currently unemployed Has a 22 year old daughter  FAMILY HISTORY: No known history of blood disorders Mother had a history of cervical cancer  maternal grandmother diabetes type 2 Maternal uncle diabetes type 2  ALLERGIES:  has no known allergies.  MEDICATIONS:  Current Outpatient Medications  Medication Sig Dispense Refill   acetaminophen (TYLENOL) 500 MG tablet Take 1,500 mg by mouth daily as needed for moderate pain.     Calcium Carb-Cholecalciferol (CALCIUM 600+D) 600-20 MG-MCG TABS Take 1 tablet by mouth daily.     Cholecalciferol (VITAMIN D3) 25 MCG (1000 UT) CAPS Take 1,000 Units by mouth daily.     eltrombopag (PROMACTA) 25 MG tablet Take 1 tablet (25mg ) by mouth 3 days a week on Monday, Wednesday and Friday. 30 tablet 11   ferrous sulfate 325 (65 FE) MG tablet Take 325 mg by mouth 2 (two) times daily.     hydrochlorothiazide (HYDRODIURIL) 25 MG tablet Take 25 mg by mouth daily.     levothyroxine (SYNTHROID) 88 MCG tablet Take 88 mcg by mouth daily before breakfast.     Multiple Vitamin (MULTIVITAMIN) tablet Take 1 tablet by mouth daily.     olopatadine (PATADAY) 0.1 % ophthalmic solution Place 1 drop into the right eye 2 (two) times daily. 5 mL 0   PEG-KCl-NaCl-NaSulf-Na Asc-C (PLENVU) 140 g SOLR Split dose prep following the doctor instruction 1 each 0   Turmeric 500 MG TABS Take 500 mg by mouth daily.     vitamin B-12 (CYANOCOBALAMIN) 1000 MCG tablet Take 1,000 mcg by mouth daily.      No current facility-administered medications for this visit.    REVIEW OF SYSTEMS:    10 Point review of Systems was done is negative except as noted above.   PHYSICAL EXAMINATION: Telemedicine visit  LABORATORY DATA:  I have reviewed the data as listed  .    Latest Ref Rng & Units 08/29/2023    8:13 AM 05/30/2023    7:35 AM 03/07/2023    7:35 AM  CBC  WBC 4.0 - 10.5 K/uL 5.3  7.6  6.7   Hemoglobin 12.0 - 15.0 g/dL 16.1  09.6  04.5   Hematocrit 36.0 - 46.0 % 37.5  37.7  40.0   Platelets 150 - 400 K/uL 190  113  120        Latest Ref Rng & Units 08/29/2023    8:13 AM 05/30/2023    7:35 AM 03/07/2023    7:35 AM  CMP  Glucose 70 - 99 mg/dL 161  096  045   BUN 6 - 20 mg/dL 11  14  10    Creatinine 0.44 - 1.00 mg/dL 4.09  8.11  9.14   Sodium 135 - 145 mmol/L 139  136  140   Potassium 3.5 - 5.1 mmol/L 3.8  3.9  4.1   Chloride 98 - 111 mmol/L 106  100  103   CO2 22 - 32 mmol/L 26  28  28    Calcium 8.9 - 10.3 mg/dL 9.0  9.2  9.0   Total Protein 6.5 - 8.1 g/dL 6.8  7.1  7.3   Total Bilirubin 0.0 - 1.2 mg/dL 0.4  0.4  0.5   Alkaline Phos 38 - 126 U/L 84  97  89   AST 15 - 41 U/L 19  16  20    ALT 0 - 44 U/L 22  18  23     . Lab Results  Component Value Date   IRON 63 08/29/2023   TIBC 400 08/29/2023   IRONPCTSAT 16 08/29/2023  . (Iron and TIBC)  Lab Results  Component Value Date   FERRITIN 35 08/29/2023    RADIOGRAPHIC STUDIES: I have personally reviewed the radiological images as listed and agreed with the findings in the report. No results found.  ASSESSMENT & PLAN:   48 y.o. African-American female with   1) Chronic ITP since 2012 now with acute relapse of her ITP with very labile platelet counts. No overt bleeding at this time. Patient has been on and off steroids and has been on Promacta since 2016. Was apparently on Promacta 25mg  po MWF. No issues with bleeding at this time. No petechiae B12 wnl. Ultrasound abdomen 06/02/2016- did show some mild  splenomegaly which could be an additional factor in her thrombocytopenia. The spleen measures 12.9 x 13.9 x 6.7 cm. The calculated volume is 628 cc. Unclear etiology of splenomegaly. Liver appeared normal.  Patient's platelet counts have normalized after treatment with Rituxan weekly 4 doses with platelets improved temporarily but she needed additional treatment.   2) Iron deficiency Anemia - likely from heavy menstrual losses. -Tolerating the po iron without any significant issues.  Iron/TIBC/Ferritin/ %Sat    Component Value Date/Time   IRON 63 08/29/2023 0813   IRON 54 04/15/2017 0835   TIBC 400 08/29/2023 0813   TIBC 411 04/15/2017 0835   FERRITIN 35 08/29/2023 0813   FERRITIN 43 04/15/2017 0835   IRONPCTSAT 16 08/29/2023 0813   IRONPCTSAT 13 (L) 04/15/2017 0835    Plan  -Continue iron polysaccharide 150 mg.  P.o. daily.  3)  Hypothyroidism - on levothyroxine As per primary care physician -continue Mx per PCP  4) hyperglycemia-likely diabetes -Patient recommended to follow-up with primary care physician for further evaluation and management -Recommended lifestyle changes to focus on appropriate diet and weight loss  4) . Patient Active Problem List   Diagnosis Date Noted   History of colonic polyps    Hx of adenomatous colonic polyps    Menorrhagia with irregular cycle 12/04/2020   Body mass index (BMI) of 50.0 to 59.9 in adult (HCC)    BRBPR (bright red blood per rectum)    Polyp of descending colon    Encntr for gyn exam (general) (routine) w/o abn findings 10/13/2016   Exposure to STD 10/13/2016   Chronic ITP (idiopathic thrombocytopenia) (HCC) 05/03/2016   Thrombocytopenia (HCC) 04/19/2016   Iron deficiency anemia 04/19/2016   Hypothyroidism 04/19/2016   Degenerative arthritis  04/19/2016   Generalized anxiety disorder 04/19/2016  -continue f/u with PCP  5) H/o PE in 2004-- still has IVC filter in situ. Thought to be triggered by immobility and obesity. -not  on anticoagulation at this time.   PLAN:   -Discussed lab results from 08/29/2023 in detail with patient. CBC normal, showed WBC of 5.3K, hemoglobin of 12.4, and platelets of 190K. -platelets normal, increased from 113K three months ago to 190K currently -hemoglobin and WBC normal -Patient has been tolerating her current Promacta dosage well without any new or severe toxicities.  -will cut down Promacta from 25 MG three days a week to 25 MG twice a week, Monday and Thursday, for ITP management -will repeat labs in a couple of months -continue to follow up with PCP to optimize DM management -there may be a role to start a GLP-1 inhibitor in regards to weight loss and fatty liver concerns   FOLLOW-UP: Phone visit with Dr Candise Che in 2 months Labs 1-2 days prior to phone visit  The total time spent in the appointment was 20 minutes* .  All of the patient's questions were answered with apparent satisfaction. The patient knows to call the clinic with any problems, questions or concerns.   Wyvonnia Lora MD MS AAHIVMS [facility] [facility] Hematology/Oncology Physician [facility]  .*Total Encounter Time as defined by the Centers for Medicare and Medicaid Services includes, in addition to the face-to-face time of a patient visit (documented in the note above) non-face-to-face time: obtaining and reviewing outside history, ordering and reviewing medications, tests or procedures, care coordination (communications with other health care professionals or caregivers) and documentation in the medical record.    I,Mitra Faeizi,acting as a Neurosurgeon for Wyvonnia Lora, MD.,have documented all relevant documentation on the behalf of Wyvonnia Lora, MD,as directed by  Wyvonnia Lora, MD while in the presence of Wyvonnia Lora, MD.  .I have reviewed the above documentation for accuracy and completeness, and I agree with the above. Johney Maine MD

## 2023-09-20 ENCOUNTER — Other Ambulatory Visit: Payer: Self-pay

## 2023-09-23 ENCOUNTER — Other Ambulatory Visit: Payer: Self-pay | Admitting: Pharmacy Technician

## 2023-09-23 ENCOUNTER — Other Ambulatory Visit: Payer: Self-pay

## 2023-09-23 ENCOUNTER — Telehealth: Payer: Self-pay | Admitting: Hematology

## 2023-09-23 NOTE — Telephone Encounter (Signed)
 Spoke with patient confirming upcoming appointment

## 2023-09-23 NOTE — Progress Notes (Signed)
 Specialty Pharmacy Refill Coordination Note  Kiara Barrera is a 48 y.o. female contacted today regarding refills of specialty medication(s) Eltrombopag Olamine [facility])   Patient requested Delivery   Delivery date: 10/12/23   Verified address: [address] [place]   Medication will be filled on 10/11/23.

## 2023-09-26 ENCOUNTER — Ambulatory Visit
Admission: RE | Admit: 2023-09-26 | Discharge: 2023-09-26 | Disposition: A | Discharge status: Home / Self Care | Payer: Medicaid Other | Source: Ambulatory Visit | Attending: Family Medicine | Admitting: Family Medicine

## 2023-09-26 DIAGNOSIS — Z1231 Encounter for screening mammogram for malignant neoplasm of breast: Secondary | ICD-10-CM

## 2023-10-11 ENCOUNTER — Other Ambulatory Visit (HOSPITAL_COMMUNITY): Payer: Self-pay

## 2023-10-11 ENCOUNTER — Other Ambulatory Visit: Payer: Self-pay

## 2023-11-07 ENCOUNTER — Other Ambulatory Visit: Payer: Self-pay

## 2023-11-07 NOTE — Progress Notes (Signed)
 Specialty Pharmacy Refill Coordination Note  Makinsey Pepitone Brazill  is a 48 y.o. female contacted today regarding refills of specialty medication(s) Eltrombopag  Olamine (PROMACTA )   Patient requested Delivery   Delivery date: 11/09/23   Verified address: [address] [place]   Medication will be filled on 11/08/23.

## 2023-11-08 ENCOUNTER — Other Ambulatory Visit: Payer: Self-pay

## 2023-11-18 ENCOUNTER — Other Ambulatory Visit: Payer: Self-pay

## 2023-11-18 DIAGNOSIS — D693 Immune thrombocytopenic purpura: Secondary | ICD-10-CM

## 2023-11-18 DIAGNOSIS — D509 Iron deficiency anemia, unspecified: Secondary | ICD-10-CM

## 2023-11-21 ENCOUNTER — Inpatient Hospital Stay: Attending: Hematology

## 2023-11-21 DIAGNOSIS — M1990 Unspecified osteoarthritis, unspecified site: Secondary | ICD-10-CM | POA: Insufficient documentation

## 2023-11-21 DIAGNOSIS — R161 Splenomegaly, not elsewhere classified: Secondary | ICD-10-CM | POA: Diagnosis not present

## 2023-11-21 DIAGNOSIS — E039 Hypothyroidism, unspecified: Secondary | ICD-10-CM | POA: Insufficient documentation

## 2023-11-21 DIAGNOSIS — Z79890 Hormone replacement therapy (postmenopausal): Secondary | ICD-10-CM | POA: Diagnosis not present

## 2023-11-21 DIAGNOSIS — N920 Excessive and frequent menstruation with regular cycle: Secondary | ICD-10-CM | POA: Diagnosis not present

## 2023-11-21 DIAGNOSIS — Z833 Family history of diabetes mellitus: Secondary | ICD-10-CM | POA: Diagnosis not present

## 2023-11-21 DIAGNOSIS — Z860101 Personal history of adenomatous and serrated colon polyps: Secondary | ICD-10-CM | POA: Diagnosis not present

## 2023-11-21 DIAGNOSIS — Z79899 Other long term (current) drug therapy: Secondary | ICD-10-CM | POA: Insufficient documentation

## 2023-11-21 DIAGNOSIS — D500 Iron deficiency anemia secondary to blood loss (chronic): Secondary | ICD-10-CM | POA: Diagnosis not present

## 2023-11-21 DIAGNOSIS — Z8049 Family history of malignant neoplasm of other genital organs: Secondary | ICD-10-CM | POA: Diagnosis not present

## 2023-11-21 DIAGNOSIS — D693 Immune thrombocytopenic purpura: Secondary | ICD-10-CM | POA: Diagnosis present

## 2023-11-21 DIAGNOSIS — F419 Anxiety disorder, unspecified: Secondary | ICD-10-CM | POA: Diagnosis not present

## 2023-11-21 DIAGNOSIS — Z7289 Other problems related to lifestyle: Secondary | ICD-10-CM | POA: Diagnosis not present

## 2023-11-21 DIAGNOSIS — G43909 Migraine, unspecified, not intractable, without status migrainosus: Secondary | ICD-10-CM | POA: Diagnosis not present

## 2023-11-21 DIAGNOSIS — D509 Iron deficiency anemia, unspecified: Secondary | ICD-10-CM

## 2023-11-21 DIAGNOSIS — Z86711 Personal history of pulmonary embolism: Secondary | ICD-10-CM | POA: Diagnosis not present

## 2023-11-21 DIAGNOSIS — R739 Hyperglycemia, unspecified: Secondary | ICD-10-CM | POA: Insufficient documentation

## 2023-11-21 LAB — CMP (CANCER CENTER ONLY)
ALT: 17 U/L (ref 0–44)
AST: 16 U/L (ref 15–41)
Albumin: 4 g/dL (ref 3.5–5.0)
Alkaline Phosphatase: 73 U/L (ref 38–126)
Anion gap: 5 (ref 5–15)
BUN: 11 mg/dL (ref 6–20)
CO2: 27 mmol/L (ref 22–32)
Calcium: 8.9 mg/dL (ref 8.9–10.3)
Chloride: 104 mmol/L (ref 98–111)
Creatinine: 0.61 mg/dL (ref 0.44–1.00)
GFR, Estimated: >60 mL/min (ref >60)
Glucose, Bld: 140 mg/dL — ABNORMAL HIGH (ref 70–99)
Potassium: 3.9 mmol/L (ref 3.5–5.1)
Sodium: 136 mmol/L (ref 135–145)
Total Bilirubin: 0.4 mg/dL (ref 0.0–1.2)
Total Protein: 7.2 g/dL (ref 6.5–8.1)

## 2023-11-21 LAB — CBC WITH DIFFERENTIAL (CANCER CENTER ONLY)
Abs Immature Granulocytes: 0.03 10*3/uL (ref 0.00–0.07)
Basophils Absolute: 0 10*3/uL (ref 0.0–0.1)
Basophils Relative: 0 %
Eosinophils Absolute: 0.2 10*3/uL (ref 0.0–0.5)
Eosinophils Relative: 2 %
HCT: 36 % (ref 36.0–46.0)
Hemoglobin: 12.5 g/dL (ref 12.0–15.0)
Immature Granulocytes: 0 %
Lymphocytes Relative: 17 %
Lymphs Abs: 1.3 10*3/uL (ref 0.7–4.0)
MCH: 27.2 pg (ref 26.0–34.0)
MCHC: 34.7 g/dL (ref 30.0–36.0)
MCV: 78.4 fL — ABNORMAL LOW (ref 80.0–100.0)
Monocytes Absolute: 0.6 10*3/uL (ref 0.1–1.0)
Monocytes Relative: 8 %
Neutro Abs: 5.5 10*3/uL (ref 1.7–7.7)
Neutrophils Relative %: 73 %
Platelet Count: 115 10*3/uL — ABNORMAL LOW (ref 150–400)
RBC: 4.59 MIL/uL (ref 3.87–5.11)
RDW: 13.7 % (ref 11.5–15.5)
WBC Count: 7.7 10*3/uL (ref 4.0–10.5)
nRBC: 0 % (ref 0.0–0.2)

## 2023-11-21 LAB — IRON AND IRON BINDING CAPACITY (CC-WL,HP ONLY)
Iron: 33 ug/dL (ref 28–170)
Saturation Ratios: 8 % — ABNORMAL LOW (ref 10.4–31.8)
TIBC: 434 ug/dL (ref 250–450)
UIBC: 401 ug/dL (ref 148–442)

## 2023-11-21 LAB — FERRITIN: Ferritin: 104 ng/mL (ref 11–307)

## 2023-11-22 NOTE — Progress Notes (Signed)
 HEMATOLOGY/ONCOLOGY PHONE VISIT NOTE  Date of Service: 11/23/2023  PCP: .Danella Dunn, MD  CHIEF COMPLAINTS/PURPOSE OF CONSULTATION:  Follow-up for continued evaluation and management of chronic ITP  HISTORY OF PRESENTING ILLNESS:   Please see previous note for details of initial presentation  INTERVAL HISTORY:  Kiara Barrera  is a 48 y.o. female who was connected via phone for continued evaluation and management of her chronic ITP.  I last had a phone visit with Kiara Barrera  on 08/31/2023 and was doing well overall.  I connected with Kiara Barrera  on 11/23/2023 at  8:40 AM EDT by telephone visit and verified that I am speaking with the correct person using two identifiers.   I discussed the limitations, risks, security and privacy concerns of performing an evaluation and management service by telemedicine and the availability of in-person appointments. I also discussed with the patient that there may be a patient responsible charge related to this service. The patient expressed understanding and agreed to proceed.   Other persons participating in the visit and their role in the encounter: none   Patient's location: home  Provider's location: [facility]   Chief Complaint: Follow-up for continued evaluation and management of chronic ITP     Today,  patient   The results of her recent lab workup were discussed with her in detail.    MEDICAL HISTORY:  #1 chronic ITP #2 Iron  deficiency anemia due to heavy periods #3 hypothyroidism #4 degenerative arthritis #5 anxiety disorder #6 history of pulmonary embolism in 2004 status post IVC filter placement. 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 #7 migraine headaches  SURGICAL HISTORY: Past Surgical History:  Procedure Laterality Date   BREAST BIOPSY Left    Pt believes she had lymphnode biopsy   C  secton  2002   COLONOSCOPY     COLONOSCOPY WITH PROPOFOL  N/A 02/07/2018   Procedure: COLONOSCOPY WITH PROPOFOL ;  Surgeon: Sergio Dandy, MD;  Location: [facility];  Service: Endoscopy;  Laterality: N/A;   COLONOSCOPY WITH PROPOFOL  N/A 03/31/2022   Procedure: COLONOSCOPY WITH PROPOFOL ;  Surgeon: Sergio Dandy, MD;  Location: [facility];  Service: Gastroenterology;  Laterality: N/A;   IVC FILTER PLACEMENT ([facility] HX)  2004   Left axillary lymph node excision biopsy  2008   POLYPECTOMY  02/07/2018   Procedure: POLYPECTOMY;  Surgeon: Sergio Dandy, MD;  [facility]: [facility];  Service: Endoscopy;;  Clips Placed    SOCIAL HISTORY: Social History   Socioeconomic History   Marital status: Single    Spouse name: Not on file   Number of children: Not on file   Years of education: Not on file   Highest education level: Not on file  Occupational History   Not on file  Tobacco Use   Smoking status: Never    Passive exposure: Past (lived with aunt who smoked in early 2000's, for about one year)   Smokeless tobacco: Never  Vaping Use   Vaping status: Never Used  Substance and Sexual Activity   Alcohol use: Yes    Comment: occasional: 1-2x per times per year one drink   Drug use: Not Currently    Types: Marijuana    Comment: expirimented as a teen   Sexual activity: Yes    Partners: Male    Birth control/protection: None  Other Topics Concern   Not on file  Social History Narrative   Not on file   Social Drivers of Health   Financial Resource Strain: Not on file  Food Insecurity: Not on file  Transportation Needs: Not on file  Physical Activity: Not on file  Stress: Not on file  Social Connections: Not on file  Intimate Partner Violence: Not on file  Recently moved from [place], [place] to [place]. Nonsmoker minimal social alcohol use no drug use Currently unemployed Has a 38 year old daughter  FAMILY HISTORY: No known history of blood  disorders Mother had a history of cervical cancer  maternal grandmother diabetes type 2 Maternal uncle diabetes type 2  ALLERGIES:  has no known allergies.  MEDICATIONS:  Current Outpatient Medications  Medication Sig Dispense Refill   acetaminophen  (TYLENOL ) 500 MG tablet Take 1,500 mg by mouth daily as needed for moderate pain.     Calcium Carb-Cholecalciferol (CALCIUM 600+D) 600-20 MG-MCG TABS Take 1 tablet by mouth daily.     Cholecalciferol (VITAMIN D3) 25 MCG (1000 UT) CAPS Take 1,000 Units by mouth daily.     eltrombopag  (PROMACTA ) 25 MG tablet Take 1 tablet (25mg ) by mouth 2 days a week on Monday and thursday. 30 tablet 11   ferrous sulfate 325 (65 FE) MG tablet Take 325 mg by mouth 2 (two) times daily.     hydrochlorothiazide (HYDRODIURIL) 25 MG tablet Take 25 mg by mouth daily.     levothyroxine  (SYNTHROID ) 88 MCG tablet Take 88 mcg by mouth daily before breakfast.     Multiple Vitamin (MULTIVITAMIN) tablet Take 1 tablet by mouth daily.     olopatadine  (PATADAY ) 0.1 % ophthalmic solution Place 1 drop into the right eye 2 (two) times daily. 5 mL 0   PEG-KCl-NaCl-NaSulf-Na Asc-C (PLENVU ) 140 g SOLR Split dose prep following the doctor instruction 1 each 0   Turmeric 500 MG TABS Take 500 mg by mouth daily.     vitamin B-12 (CYANOCOBALAMIN) 1000 MCG tablet Take 1,000 mcg by mouth daily.     No current facility-administered medications for this visit.    REVIEW OF SYSTEMS:    10 Point review of Systems was done is negative except as noted above.   PHYSICAL EXAMINATION: Telemedicine visit  LABORATORY DATA:  I have reviewed the data as listed  .    Latest Ref Rng & Units 11/21/2023    8:00 AM 08/29/2023    8:13 AM 05/30/2023    7:35 AM  CBC  WBC 4.0 - 10.5 K/uL 7.7  5.3  7.6   Hemoglobin 12.0 - 15.0 g/dL 40.9  81.1  91.4   Hematocrit 36.0 - 46.0 % 36.0  37.5  37.7   Platelets 150 - 400 K/uL 115  190  113        Latest Ref Rng & Units 11/21/2023    8:00 AM 08/29/2023     8:13 AM 05/30/2023    7:35 AM  CMP  Glucose 70 - 99 mg/dL 782  956  213   BUN 6 - 20 mg/dL 11  11  14    Creatinine 0.44 - 1.00 mg/dL 0.86  5.78  4.69   Sodium 135 - 145 mmol/L 136  139  136   Potassium 3.5 - 5.1 mmol/L 3.9  3.8  3.9   Chloride 98 - 111 mmol/L 104  106  100   CO2 22 - 32 mmol/L 27  26  28    Calcium 8.9 - 10.3 mg/dL 8.9  9.0  9.2   Total Protein 6.5 -  8.1 g/dL 7.2  6.8  7.1   Total Bilirubin 0.0 - 1.2 mg/dL 0.4  0.4  0.4   Alkaline Phos 38 - 126 U/L 73  84  97   AST 15 - 41 U/L 16  19  16    ALT 0 - 44 U/L 17  22  18     . Lab Results  Component Value Date   IRON  33 11/21/2023   TIBC 434 11/21/2023   IRONPCTSAT 8 (L) 11/21/2023  . (Iron  and TIBC)  Lab Results  Component Value Date   FERRITIN 104 11/21/2023    RADIOGRAPHIC STUDIES: I have personally reviewed the radiological images as listed and agreed with the findings in the report. No results found.  ASSESSMENT & PLAN:   48 y.o. African-American female with   1) Chronic ITP since 2012 now with acute relapse of her ITP with very labile platelet counts. No overt bleeding at this time. Patient has been on and off steroids and has been on Promacta  since 2016. Was apparently on Promacta  25mg  po MWF. No issues with bleeding at this time. No petechiae B12 wnl. Ultrasound abdomen 06/02/2016- did show some mild splenomegaly which could be an additional factor in her thrombocytopenia. The spleen measures 12.9 x 13.9 x 6.7 cm. The calculated volume is 628 cc. Unclear etiology of splenomegaly. Liver appeared normal.  Patient's platelet counts have normalized after treatment with Rituxan  weekly 4 doses with platelets improved temporarily but she needed additional treatment.   2) Iron  deficiency Anemia - likely from heavy menstrual losses. -Tolerating the po iron  without any significant issues.  Iron /TIBC/Ferritin/ %Sat    Component Value Date/Time   IRON  33 11/21/2023 0800   IRON  54 04/15/2017 0835   TIBC  434 11/21/2023 0800   TIBC 411 04/15/2017 0835   FERRITIN 104 11/21/2023 0800   FERRITIN 43 04/15/2017 0835   IRONPCTSAT 8 (L) 11/21/2023 0800   IRONPCTSAT 13 (L) 04/15/2017 0835    Plan  -Continue iron  polysaccharide 150 mg.  P.o. daily.  3)  Hypothyroidism - on levothyroxine  As per primary care physician -continue Mx per PCP  4) hyperglycemia-likely diabetes F/u with PCP  4) . Patient Active Problem List   Diagnosis Date Noted   History of colonic polyps    Hx of adenomatous colonic polyps    Menorrhagia with irregular cycle 12/04/2020   Body mass index (BMI) of 50.0 to 59.9 in adult (HCC)    BRBPR (bright red blood per rectum)    Polyp of descending colon    Encntr for gyn exam (general) (routine) w/o abn findings 10/13/2016   Exposure to STD 10/13/2016   Chronic ITP (idiopathic thrombocytopenia) (HCC) 05/03/2016   Thrombocytopenia (HCC) 04/19/2016   Iron  deficiency anemia 04/19/2016   Hypothyroidism 04/19/2016   Degenerative arthritis 04/19/2016   Generalized anxiety disorder 04/19/2016  -continue f/u with PCP  5) H/o PE in 2004-- still has IVC filter in situ. Thought to be triggered by immobility and obesity. -not on anticoagulation at this time.   PLAN:   -Discussed lab results from 11/21/2023 in detail with patient. CBC showed WBC of 7.7K, hemoglobin of 12.5, and platelets of 115K. -ferritin improved to 104 from 35 two months ago -iron  saturation 8%, from 16% two months ago - no toxicitiies from current dose of Promacta  will continue 25mg  po twice weekly -continue po iron  polysaccharide 150mg  po daily  FOLLOW-UP: RTC with Dr Salomon Cree with labs in 3 months   The total time spent in the  appointment was 15 minutes* .  All of the patient's questions were answered with apparent satisfaction. The patient knows to call the clinic with any problems, questions or concerns.   Jacquelyn Matt MD MS AAHIVMS [facility] Hematology/Oncology Physician [facility]  .*Total Encounter Time as defined by the Centers for Medicare and Medicaid Services includes, in addition to the face-to-face time of a patient visit (documented in the note above) non-face-to-face time: obtaining and reviewing outside history, ordering and reviewing medications, tests or procedures, care coordination (communications with other health care professionals or caregivers) and documentation in the medical record.    I,Mitra Faeizi,acting as a Neurosurgeon for Jacquelyn Matt, MD.,have documented all relevant documentation on the behalf of Jacquelyn Matt, MD,as directed by  Jacquelyn Matt, MD while in the presence of Jacquelyn Matt, MD.  .I have reviewed the above documentation for accuracy and completeness, and I agree with the above. .Davis Vannatter Kishore Terik Haughey MD

## 2023-11-23 ENCOUNTER — Inpatient Hospital Stay (HOSPITAL_BASED_OUTPATIENT_CLINIC_OR_DEPARTMENT_OTHER): Admitting: Hematology

## 2023-11-23 DIAGNOSIS — D509 Iron deficiency anemia, unspecified: Secondary | ICD-10-CM

## 2023-11-23 DIAGNOSIS — D693 Immune thrombocytopenic purpura: Secondary | ICD-10-CM | POA: Diagnosis not present

## 2023-12-05 ENCOUNTER — Other Ambulatory Visit: Payer: Self-pay

## 2023-12-07 ENCOUNTER — Other Ambulatory Visit: Payer: Self-pay

## 2023-12-07 NOTE — Progress Notes (Signed)
 Specialty Pharmacy Refill Coordination Note  Jameria Bradway Oleksy  is a 48 y.o. female contacted today regarding refills of specialty medication(s) Eltrombopag  Olamine (PROMACTA )   Patient requested Delivery   Delivery date: 12/09/23   Verified address: [address] [name] [address]   Medication will be filled on 12/08/23.

## 2023-12-08 ENCOUNTER — Other Ambulatory Visit: Payer: Self-pay

## 2023-12-08 ENCOUNTER — Other Ambulatory Visit (HOSPITAL_COMMUNITY): Payer: Self-pay

## 2023-12-08 ENCOUNTER — Other Ambulatory Visit: Payer: Self-pay | Admitting: Hematology

## 2023-12-08 DIAGNOSIS — D693 Immune thrombocytopenic purpura: Secondary | ICD-10-CM

## 2023-12-08 MED ORDER — PROMACTA 25 MG PO TABS
ORAL_TABLET | ORAL | 11 refills | Status: AC
  Filled 2023-12-08: qty 30, fill #0
  Filled 2023-12-08: qty 8, 28d supply, fill #0
  Filled 2024-01-06: qty 8, 28d supply, fill #1
  Filled 2024-01-27 – 2024-02-08 (×2): qty 8, 28d supply, fill #2
  Filled 2024-03-01: qty 8, 28d supply, fill #3
  Filled 2024-03-29: qty 8, 28d supply, fill #4
  Filled 2024-04-23: qty 8, 28d supply, fill #5
  Filled 2024-05-21: qty 8, 28d supply, fill #6
  Filled 2024-06-13: qty 8, 28d supply, fill #7
  Filled 2024-07-16 – 2024-08-07 (×2): qty 8, 28d supply, fill #8

## 2023-12-08 NOTE — Progress Notes (Signed)
 lvm - delivery delayed due to issue with insurance and access to generic promacta 

## 2023-12-09 ENCOUNTER — Other Ambulatory Visit: Payer: Self-pay

## 2024-01-04 ENCOUNTER — Other Ambulatory Visit: Payer: Self-pay

## 2024-01-06 ENCOUNTER — Other Ambulatory Visit: Payer: Self-pay

## 2024-01-06 NOTE — Progress Notes (Signed)
 Specialty Pharmacy Refill Coordination Note  Kiara Barrera  is a 48 y.o. female contacted today regarding refills of specialty medication(s) Eltrombopag  Olamine (Promacta )   Patient requested Delivery   Delivery date: 01/09/24   Verified address: [address] [place] [address]   Medication will be filled on 01/06/24.

## 2024-01-27 ENCOUNTER — Other Ambulatory Visit: Payer: Self-pay

## 2024-01-31 ENCOUNTER — Other Ambulatory Visit: Payer: Self-pay

## 2024-02-08 ENCOUNTER — Other Ambulatory Visit: Payer: Self-pay

## 2024-02-08 ENCOUNTER — Other Ambulatory Visit (HOSPITAL_COMMUNITY): Payer: Self-pay

## 2024-02-08 NOTE — Progress Notes (Signed)
 Specialty Pharmacy Refill Coordination Note  Spoke with Kiara Barrera   Kiara Barrera  is a 48 y.o. female contacted today regarding refills of specialty medication(s) Eltrombopag  Olamine (Promacta )  Doses on hand: 1 (2 tablets for 02/09/24)  Patient requested: Delivery   Delivery date: 02/10/24   Verified address: [address] [name] [address]  Medication will be filled on 02/09/24.

## 2024-03-01 ENCOUNTER — Other Ambulatory Visit: Payer: Self-pay

## 2024-03-01 NOTE — Progress Notes (Signed)
 Specialty Pharmacy Refill Coordination Note  Electa Sterry Schrade  is a 48 y.o. female contacted today regarding refills of specialty medication(s) Eltrombopag Olamine (Promacta)   Patient requested Delivery   Delivery date: 03/06/24   Verified address: [address] [place] [address]   Medication will be filled on 03/05/24.

## 2024-03-01 NOTE — Progress Notes (Signed)
 Specialty Pharmacy Ongoing Clinical Assessment Note  Kiara Barrera  is a 48 y.o. female who is being followed by the specialty pharmacy service for RxSp Bleeding Disorders   Patient's specialty medication(s) reviewed today: Eltrombopag Olamine (Promacta)   Missed doses in the last 4 weeks: 0   Patient/Caregiver did not have any additional questions or concerns.   Therapeutic benefit summary: Patient is achieving benefit   Adverse events/side effects summary: No adverse events/side effects   Patient's therapy is appropriate to: Continue    Goals Addressed             This Visit's Progress    Stabilization of disease   On track    Patient is on track. Patient will maintain adherence         Follow up: 12 months  Silvano LOISE Dolly Specialty Pharmacist

## 2024-03-05 ENCOUNTER — Other Ambulatory Visit: Payer: Self-pay

## 2024-03-06 ENCOUNTER — Other Ambulatory Visit: Payer: Self-pay

## 2024-03-06 DIAGNOSIS — D693 Immune thrombocytopenic purpura: Secondary | ICD-10-CM

## 2024-03-07 ENCOUNTER — Other Ambulatory Visit

## 2024-03-07 ENCOUNTER — Inpatient Hospital Stay: Attending: Hematology

## 2024-03-07 ENCOUNTER — Inpatient Hospital Stay

## 2024-03-07 ENCOUNTER — Inpatient Hospital Stay: Admitting: Hematology

## 2024-03-07 ENCOUNTER — Inpatient Hospital Stay (HOSPITAL_BASED_OUTPATIENT_CLINIC_OR_DEPARTMENT_OTHER): Admitting: Hematology

## 2024-03-07 VITALS — BP 158/101 | HR 77 | Temp 97.3°F | Resp 20 | Wt 312.4 lb

## 2024-03-07 DIAGNOSIS — Z8049 Family history of malignant neoplasm of other genital organs: Secondary | ICD-10-CM | POA: Insufficient documentation

## 2024-03-07 DIAGNOSIS — D693 Immune thrombocytopenic purpura: Secondary | ICD-10-CM | POA: Insufficient documentation

## 2024-03-07 DIAGNOSIS — Z860101 Personal history of adenomatous and serrated colon polyps: Secondary | ICD-10-CM | POA: Insufficient documentation

## 2024-03-07 DIAGNOSIS — Z79899 Other long term (current) drug therapy: Secondary | ICD-10-CM | POA: Insufficient documentation

## 2024-03-07 DIAGNOSIS — Z86711 Personal history of pulmonary embolism: Secondary | ICD-10-CM | POA: Insufficient documentation

## 2024-03-07 DIAGNOSIS — Z833 Family history of diabetes mellitus: Secondary | ICD-10-CM | POA: Diagnosis not present

## 2024-03-07 DIAGNOSIS — M1990 Unspecified osteoarthritis, unspecified site: Secondary | ICD-10-CM | POA: Insufficient documentation

## 2024-03-07 DIAGNOSIS — Z79890 Hormone replacement therapy (postmenopausal): Secondary | ICD-10-CM | POA: Insufficient documentation

## 2024-03-07 DIAGNOSIS — G43909 Migraine, unspecified, not intractable, without status migrainosus: Secondary | ICD-10-CM | POA: Diagnosis not present

## 2024-03-07 DIAGNOSIS — E039 Hypothyroidism, unspecified: Secondary | ICD-10-CM | POA: Diagnosis not present

## 2024-03-07 DIAGNOSIS — F419 Anxiety disorder, unspecified: Secondary | ICD-10-CM | POA: Insufficient documentation

## 2024-03-07 DIAGNOSIS — D509 Iron deficiency anemia, unspecified: Secondary | ICD-10-CM | POA: Diagnosis not present

## 2024-03-07 LAB — CMP (CANCER CENTER ONLY)
ALT: 18 U/L (ref 0–44)
AST: 17 U/L (ref 15–41)
Albumin: 4.1 g/dL (ref 3.5–5.0)
Alkaline Phosphatase: 77 U/L (ref 38–126)
Anion gap: 7 (ref 5–15)
BUN: 13 mg/dL (ref 6–20)
CO2: 27 mmol/L (ref 22–32)
Calcium: 9.1 mg/dL (ref 8.9–10.3)
Chloride: 105 mmol/L (ref 98–111)
Creatinine: 0.53 mg/dL (ref 0.44–1.00)
GFR, Estimated: >60 mL/min (ref >60)
Glucose, Bld: 104 mg/dL — ABNORMAL HIGH (ref 70–99)
Potassium: 3.9 mmol/L (ref 3.5–5.1)
Sodium: 139 mmol/L (ref 135–145)
Total Bilirubin: 0.4 mg/dL (ref 0.0–1.2)
Total Protein: 7.3 g/dL (ref 6.5–8.1)

## 2024-03-07 LAB — CBC WITH DIFFERENTIAL (CANCER CENTER ONLY)
Abs Immature Granulocytes: 0.02 K/uL (ref 0.00–0.07)
Basophils Absolute: 0 K/uL (ref 0.0–0.1)
Basophils Relative: 0 %
Eosinophils Absolute: 0.1 K/uL (ref 0.0–0.5)
Eosinophils Relative: 2 %
HCT: 38.7 % (ref 36.0–46.0)
Hemoglobin: 13.3 g/dL (ref 12.0–15.0)
Immature Granulocytes: 0 %
Lymphocytes Relative: 27 %
Lymphs Abs: 1.4 K/uL (ref 0.7–4.0)
MCH: 26.9 pg (ref 26.0–34.0)
MCHC: 34.4 g/dL (ref 30.0–36.0)
MCV: 78.2 fL — ABNORMAL LOW (ref 80.0–100.0)
Monocytes Absolute: 0.5 K/uL (ref 0.1–1.0)
Monocytes Relative: 10 %
Neutro Abs: 3 K/uL (ref 1.7–7.7)
Neutrophils Relative %: 61 %
Platelet Count: 113 K/uL — ABNORMAL LOW (ref 150–400)
RBC: 4.95 MIL/uL (ref 3.87–5.11)
RDW: 14.1 % (ref 11.5–15.5)
WBC Count: 5 K/uL (ref 4.0–10.5)
nRBC: 0 % (ref 0.0–0.2)

## 2024-03-07 LAB — IRON AND IRON BINDING CAPACITY (CC-WL,HP ONLY)
Iron: 46 ug/dL (ref 28–170)
Saturation Ratios: 12 % (ref 10.4–31.8)
TIBC: 400 ug/dL (ref 250–450)
UIBC: 354 ug/dL (ref 148–442)

## 2024-03-07 LAB — FERRITIN: Ferritin: 128 ng/mL (ref 11–307)

## 2024-03-13 NOTE — Progress Notes (Signed)
 HEMATOLOGY/ONCOLOGY CLINIC VISIT NOTE  Date of Service: .03/07/2024  PCP: .Ilah Crigler, MD  CHIEF COMPLAINTS/PURPOSE OF CONSULTATION:  Follow-up for continued evaluation and management of chronic ITP  HISTORY OF PRESENTING ILLNESS:   Please see previous note for details of initial presentation  INTERVAL HISTORY:  Kiara Barrera  is a wonderful 48 year old female who is here for continued valuation and management of her chronic ITP.  She notes no acute new symptoms in the last 3 months since her phone visit. She notes that she is still gaining weight and is trying to get some help for this. No abnormal bleeding or bruising. Continues to be on her Promacta  25 mg twice weekly on Monday and Thursday.  Has not had any issues taking this and has maintained compliance with this. Overall in good spirits with no other acute new issues. No recent infection issues.  MEDICAL HISTORY:  #1 chronic ITP #2 Iron  deficiency anemia due to heavy periods #3 hypothyroidism #4 degenerative arthritis #5 anxiety disorder #6 history of pulmonary embolism in 2004 status post IVC filter placement. 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 #7 migraine headaches  SURGICAL HISTORY: Past Surgical History:  Procedure Laterality Date   BREAST BIOPSY Left    Pt believes she had lymphnode biopsy   C secton  2002   COLONOSCOPY     COLONOSCOPY WITH PROPOFOL  N/A 02/07/2018   Procedure: COLONOSCOPY WITH PROPOFOL ;  Surgeon: Shila Gustav GAILS, MD;  Location: [facility];  Service: Endoscopy;  Laterality: N/A;   COLONOSCOPY WITH PROPOFOL  N/A 03/31/2022   Procedure: COLONOSCOPY WITH PROPOFOL ;  Surgeon: Shila Gustav GAILS, MD;  Location: [facility];  Service: Gastroenterology;  Laterality: N/A;   IVC FILTER PLACEMENT ([facility] HX)  2004   Left axillary lymph node excision biopsy  2008    POLYPECTOMY  02/07/2018   Procedure: POLYPECTOMY;  Surgeon: Shila Gustav GAILS, MD;  [facility];  Service: Endoscopy;;  Clips Placed    SOCIAL HISTORY: Social History   Socioeconomic History   Marital status: Single    Spouse name: Not on file   Number of children: Not on file   Years of education: Not on file   Highest education level: Not on file  Occupational History   Not on file  Tobacco Use   Smoking status: Never    Passive exposure: Past (lived with aunt who smoked in early 2000's, for about one year)   Smokeless tobacco: Never  Vaping Use   Vaping status: Never Used  Substance and Sexual Activity   Alcohol use: Yes    Comment: occasional: 1-2x per times per year one drink   Drug use: Not Currently    Types: Marijuana    Comment: expirimented as a teen   Sexual activity: Yes    Partners: Male    Birth control/protection: None  Other Topics Concern   Not on file  Social History Narrative   Not on file   Social Drivers of Health   Financial Resource Strain: Not on file  Food Insecurity: Not on file  Transportation Needs: Not on file  Physical Activity: Not on file  Stress: Not on file  Social Connections: Not on file  Intimate Partner Violence: Not on file  Recently moved from [place], [place] to [place]. Nonsmoker minimal social alcohol use no drug use Currently unemployed Has a 19 year old daughter  FAMILY HISTORY:  No known history of blood disorders Mother had a history of cervical cancer  maternal grandmother diabetes type 2 Maternal uncle diabetes type 2  ALLERGIES:  has no known allergies.  MEDICATIONS:  Current Outpatient Medications  Medication Sig Dispense Refill   acetaminophen  (TYLENOL ) 500 MG tablet Take 1,500 mg by mouth daily as needed for moderate pain.     Calcium Carb-Cholecalciferol (CALCIUM 600+D) 600-20 MG-MCG TABS Take 1 tablet by mouth daily.     Cholecalciferol (VITAMIN D3) 25 MCG (1000 UT) CAPS Take 1,000  Units by mouth daily.     ferrous sulfate 325 (65 FE) MG tablet Take 325 mg by mouth 2 (two) times daily.     LANTUS SOLOSTAR 100 UNIT/ML Solostar Pen Inject 15 Units into the skin at bedtime.     levothyroxine  (SYNTHROID ) 88 MCG tablet Take 88 mcg by mouth daily before breakfast.     MOUNJARO 2.5 MG/0.5ML Pen Inject 2.5 mg into the skin once a week.     Multiple Vitamin (MULTIVITAMIN) tablet Take 1 tablet by mouth daily.     PROMACTA  25 MG tablet Take 1 tablet (25mg ) by mouth 2 days a week on Monday and thursday. 30 tablet 11   vitamin B-12 (CYANOCOBALAMIN) 1000 MCG tablet Take 1,000 mcg by mouth daily.     hydrochlorothiazide (HYDRODIURIL) 25 MG tablet Take 25 mg by mouth daily. (Patient not taking: Reported on 03/07/2024)     olopatadine  (PATADAY ) 0.1 % ophthalmic solution Place 1 drop into the right eye 2 (two) times daily. (Patient not taking: Reported on 03/07/2024) 5 mL 0   PEG-KCl-NaCl-NaSulf-Na Asc-C (PLENVU ) 140 g SOLR Split dose prep following the doctor instruction (Patient not taking: Reported on 03/07/2024) 1 each 0   Turmeric 500 MG TABS Take 500 mg by mouth daily. (Patient not taking: Reported on 03/07/2024)     No current facility-administered medications for this visit.    REVIEW OF SYSTEMS:   .10 Point review of Systems was done is negative except as noted above.   PHYSICAL EXAMINATION: .BP (!) 158/101 Comment: re-check  Pulse 77   Temp (!) 97.3 F (36.3 C)   Resp 20   Wt (!) 312 lb 6.4 oz (141.7 kg)   SpO2 99%   BMI 57.14 kg/m  . GENERAL:alert, in no acute distress and comfortable SKIN: no acute rashes, no significant lesions EYES: conjunctiva are pink and non-injected, sclera anicteric OROPHARYNX: MMM, no exudates, no oropharyngeal erythema or ulceration NECK: supple, no JVD LYMPH:  no palpable lymphadenopathy in the cervical, axillary or inguinal regions LUNGS: clear to auscultation b/l with normal respiratory effort HEART: regular rate & rhythm ABDOMEN:   normoactive bowel sounds , non tender, not distended. Extremity: no pedal edema PSYCH: alert & oriented x 3 with fluent speech NEURO: no focal motor/sensory deficits   LABORATORY DATA:  I have reviewed the data as listed  .    Latest Ref Rng & Units 03/07/2024   11:54 AM 11/21/2023    8:00 AM 08/29/2023    8:13 AM  CBC  WBC 4.0 - 10.5 K/uL 5.0  7.7  5.3   Hemoglobin 12.0 - 15.0 g/dL 86.6  87.4  87.5   Hematocrit 36.0 - 46.0 % 38.7  36.0  37.5   Platelets 150 - 400 K/uL 113  115  190        Latest Ref Rng & Units 03/07/2024   11:54 AM 11/21/2023    8:00 AM 08/29/2023    8:13 AM  CMP  Glucose 70 - 99 mg/dL 895  859  824   BUN 6 - 20 mg/dL 13  11  11    Creatinine 0.44 - 1.00 mg/dL 9.46  9.38  9.43   Sodium 135 - 145 mmol/L 139  136  139   Potassium 3.5 - 5.1 mmol/L 3.9  3.9  3.8   Chloride 98 - 111 mmol/L 105  104  106   CO2 22 - 32 mmol/L 27  27  26    Calcium 8.9 - 10.3 mg/dL 9.1  8.9  9.0   Total Protein 6.5 - 8.1 g/dL 7.3  7.2  6.8   Total Bilirubin 0.0 - 1.2 mg/dL 0.4  0.4  0.4   Alkaline Phos 38 - 126 U/L 77  73  84   AST 15 - 41 U/L 17  16  19    ALT 0 - 44 U/L 18  17  22     . Lab Results  Component Value Date   IRON  46 03/07/2024   TIBC 400 03/07/2024   IRONPCTSAT 12 03/07/2024  . (Iron  and TIBC)  Lab Results  Component Value Date   FERRITIN 128 03/07/2024    RADIOGRAPHIC STUDIES: I have personally reviewed the radiological images as listed and agreed with the findings in the report. No results found.  ASSESSMENT & PLAN:   48 y.o. African-American female with   1) Chronic ITP since 2012 now with acute relapse of her ITP with very labile platelet counts. No overt bleeding at this time. Patient has been on and off steroids and has been on Promacta  since 2016. Was apparently on Promacta  25mg  po MWF. No issues with bleeding at this time. No petechiae B12 wnl. Ultrasound abdomen 06/02/2016- did show some mild splenomegaly which could be an additional factor  in her thrombocytopenia. The spleen measures 12.9 x 13.9 x 6.7 cm. The calculated volume is 628 cc. Unclear etiology of splenomegaly. Liver appeared normal.  Patient's platelet counts have normalized after treatment with Rituxan  weekly 4 doses with platelets improved temporarily but she needed additional treatment.   2) Iron  deficiency Anemia - likely from heavy menstrual losses. -Tolerating the po iron  without any significant issues.  Iron /TIBC/Ferritin/ %Sat    Component Value Date/Time   IRON  46 03/07/2024 1154   IRON  54 04/15/2017 0835   TIBC 400 03/07/2024 1154   TIBC 411 04/15/2017 0835   FERRITIN 128 03/07/2024 1154   FERRITIN 43 04/15/2017 0835   IRONPCTSAT 12 03/07/2024 1154   IRONPCTSAT 13 (L) 04/15/2017 0835    Plan  -Continue iron  polysaccharide 150 mg.  P.o. daily.  3)  Hypothyroidism - on levothyroxine  As per primary care physician -continue Mx per PCP  4) hyperglycemia-likely diabetes F/u with PCP  4) . Patient Active Problem List   Diagnosis Date Noted   History of colonic polyps    Hx of adenomatous colonic polyps    Menorrhagia with irregular cycle 12/04/2020   Body mass index (BMI) of 50.0 to 59.9 in adult (HCC)    BRBPR (bright red blood per rectum)    Polyp of descending colon    Encntr for gyn exam (general) (routine) w/o abn findings 10/13/2016   Exposure to STD 10/13/2016   Chronic ITP (idiopathic thrombocytopenia) (HCC) 05/03/2016   Thrombocytopenia (HCC) 04/19/2016   Iron  deficiency anemia 04/19/2016   Hypothyroidism 04/19/2016   Degenerative arthritis 04/19/2016   Generalized anxiety disorder 04/19/2016  -continue f/u with PCP  5) H/o PE in 2004-- still has IVC filter in  situ. Thought to be triggered by immobility and obesity. -not on anticoagulation at this time.   PLAN:  -Discussed lab results from today 03/07/2024 CBC shows normal hemoglobin of 13.3, normal WBC count of 5k and stable platelet count of 113k No issues with abnormal  bleeding or bruising CMP is stable with no abnormal liver function tests and normal renal function and chemistries. Patient does not note any toxicities from her current dose of Promacta . Will continue her low-dose Promacta  25 mg twice weekly on Monday and Thursday. Ferritin level was 128 with an iron  saturation of 12% -continue po iron  polysaccharide 150mg  po 3 days a week  FOLLOW-UP: RTC with Dr. Onesimo with labs in 3 months  .The total time spent in the appointment was 20 minutes* .  All of the patient's questions were answered with apparent satisfaction. The patient knows to call the clinic with any problems, questions or concerns.   Emaline Onesimo MD MS AAHIVMS [facility] Hematology/Oncology Physician [facility]  .*Total Encounter Time as defined by the Centers for Medicare and Medicaid Services includes, in addition to the face-to-face time of a patient visit (documented in the note above) non-face-to-face time: obtaining and reviewing outside history, ordering and reviewing medications, tests or procedures, care coordination (communications with other health care professionals or caregivers) and documentation in the medical record.

## 2024-03-29 ENCOUNTER — Other Ambulatory Visit: Payer: Self-pay | Admitting: Pharmacy Technician

## 2024-03-29 ENCOUNTER — Other Ambulatory Visit: Payer: Self-pay

## 2024-03-29 NOTE — Progress Notes (Signed)
 Specialty Pharmacy Refill Coordination Note  Kiara Barrera  is a 48 y.o. female contacted today regarding refills of specialty medication(s) Eltrombopag  Olamine (Promacta )   Patient requested Delivery   Delivery date: 03/30/24   Verified address: [name] [address]   Medication will be filled on 03/29/24.

## 2024-04-19 ENCOUNTER — Other Ambulatory Visit: Payer: Self-pay

## 2024-04-23 ENCOUNTER — Other Ambulatory Visit: Payer: Self-pay

## 2024-04-23 NOTE — Progress Notes (Signed)
 Specialty Pharmacy Refill Coordination Note  Kiara Barrera  is a 48 y.o. female contacted today regarding refills of specialty medication(s) Eltrombopag  Olamine (Promacta )   Patient requested Delivery   Delivery date: 04/27/24   Verified address: [address] [place] [address]   Medication will be filled on 04/26/24.

## 2024-04-23 NOTE — Progress Notes (Signed)
 Clinical Intervention Note  Clinical Intervention Notes: Patient reported initiating amlodipine. No DDIs identified with Promacta .   Clinical Intervention Outcomes: Prevention of an adverse drug event   Delon CHRISTELLA [facility] Specialty Pharmacist

## 2024-04-25 ENCOUNTER — Other Ambulatory Visit: Payer: Self-pay

## 2024-05-16 ENCOUNTER — Ambulatory Visit

## 2024-05-16 DIAGNOSIS — E119 Type 2 diabetes mellitus without complications: Secondary | ICD-10-CM | POA: Diagnosis not present

## 2024-05-16 DIAGNOSIS — B353 Tinea pedis: Secondary | ICD-10-CM

## 2024-05-16 DIAGNOSIS — B351 Tinea unguium: Secondary | ICD-10-CM | POA: Diagnosis not present

## 2024-05-16 MED ORDER — TERBINAFINE HCL 250 MG PO TABS: 250.0000 mg | ORAL_TABLET | Freq: Every day | ORAL | 0 refills | Status: AC

## 2024-05-16 NOTE — Progress Notes (Signed)
  Subjective:  Patient ID: Kiara Barrera , female    DOB: May 15, 1976,  MRN: [id number]  48 y.o. female presents with chief concern of elongated, thickened, painful, discolored toenails for months. 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 She has been diagnosed with pre-diabetes and relates to minor burning/tingling in her feet. She also experiences itching.   Chief Complaint  Patient presents with   Nail Problem    Rm1 Patient has some concerns about the way toenails are growing/no treatment/diabetic     PCP is Ilah Crigler, MD   No Known Allergies  Review of Systems: Negative except as noted in the HPI.   Objective:  Kiara Barrera  is a pleasant 48 y.o. female in NAD. AAO x 3.  Vascular Examination: Vascular status intact b/l with palpable pedal pulses. CFT immediate b/l. Pedal hair present. No edema. No pain with calf compression b/l. Skin temperature gradient WNL b/l. No varicosities noted. No cyanosis or clubbing noted.  Neurological Examination: Sensation grossly intact b/l with 10 gram monofilament. Negative tinel sign at tarsal tunnel bilaterally.   Dermatological Examination: Pedal skin with normal turgor, texture and tone b/l. Minor interdigital maceration. Peeling of skin in moccasin pattern from digits to posterior heel. Toenails 1-5 b/l thick, discolored, elongated with subungual debris and pain on dorsal palpation. No hyperkeratotic lesions noted b/l.   Musculoskeletal Examination: Muscle strength 5/5 to b/l LE.  No pain, crepitus noted b/l. No gross pedal deformities. Patient ambulates independently without assistive aids.   Radiographs: None     Assessment:   1. Onychomycosis   2. Type 2 diabetes mellitus without complication, without long-term current use of insulin (HCC)   3. Tinea pedis of both feet     Plan:  Diabetic foot examination performed today. All patient's and/or POA's questions/concerns addressed on today's visit. Continue foot and  shoe inspections daily. Monitor blood glucose per PCP/Endocrinologist's recommendations. Continue soft, supportive shoe gear daily. Report any pedal injuries to medical professional. Call office if there are any questions/concerns. -Patient/POA to call should there be question/concern in the interim. - Discussed use of terbinafine with patient to treat onychomycosis as well as tinea pedis. 90 day prescription 250mg  1 tab PO daily. Liver labs normal.  - RTC 6 weeks to check improvement.   Prentice Ovens, DPM AACFAS [facility] Trained Podiatric Surgeon [facility]       [place] LOCATION: [address] [place] [address]                   Office [phone]   [facility] LOCATION: [address] [place], [place] [address] Office [phone]

## 2024-05-21 ENCOUNTER — Other Ambulatory Visit (HOSPITAL_COMMUNITY): Payer: Self-pay

## 2024-05-21 ENCOUNTER — Other Ambulatory Visit: Payer: Self-pay

## 2024-05-23 ENCOUNTER — Other Ambulatory Visit (HOSPITAL_COMMUNITY): Payer: Self-pay

## 2024-05-25 ENCOUNTER — Other Ambulatory Visit: Payer: Self-pay

## 2024-05-25 NOTE — Progress Notes (Signed)
 Specialty Pharmacy Refill Coordination Note  Kiara Barrera  is a 48 y.o. female contacted today regarding refills of specialty medication(s) Eltrombopag  Olamine (Promacta )   Patient requested Delivery   Delivery date: 05/28/24   Verified address: [address] [place] [phone]   Medication will be filled on: 05/25/24

## 2024-06-13 ENCOUNTER — Other Ambulatory Visit (HOSPITAL_COMMUNITY): Payer: Self-pay

## 2024-06-19 ENCOUNTER — Other Ambulatory Visit: Payer: Self-pay

## 2024-06-20 ENCOUNTER — Other Ambulatory Visit: Payer: Self-pay

## 2024-06-20 NOTE — Progress Notes (Signed)
 Specialty Pharmacy Refill Coordination Note  Kiara Barrera  is a 48 y.o. female contacted today regarding refills of specialty medication(s) Eltrombopag  Olamine (Promacta )   Patient requested Delivery   Delivery date: 06/26/24   Verified address: [address] [place] [address]   Medication will be filled on: 06/25/24

## 2024-06-25 ENCOUNTER — Other Ambulatory Visit: Payer: Self-pay

## 2024-07-03 ENCOUNTER — Other Ambulatory Visit: Payer: Self-pay

## 2024-07-03 DIAGNOSIS — D509 Iron deficiency anemia, unspecified: Secondary | ICD-10-CM

## 2024-07-03 DIAGNOSIS — D693 Immune thrombocytopenic purpura: Secondary | ICD-10-CM

## 2024-07-04 ENCOUNTER — Inpatient Hospital Stay: Attending: Hematology

## 2024-07-04 DIAGNOSIS — N920 Excessive and frequent menstruation with regular cycle: Secondary | ICD-10-CM | POA: Diagnosis not present

## 2024-07-04 DIAGNOSIS — F419 Anxiety disorder, unspecified: Secondary | ICD-10-CM | POA: Diagnosis not present

## 2024-07-04 DIAGNOSIS — E039 Hypothyroidism, unspecified: Secondary | ICD-10-CM | POA: Insufficient documentation

## 2024-07-04 DIAGNOSIS — D693 Immune thrombocytopenic purpura: Secondary | ICD-10-CM | POA: Insufficient documentation

## 2024-07-04 DIAGNOSIS — D500 Iron deficiency anemia secondary to blood loss (chronic): Secondary | ICD-10-CM | POA: Insufficient documentation

## 2024-07-04 DIAGNOSIS — Z79899 Other long term (current) drug therapy: Secondary | ICD-10-CM | POA: Diagnosis not present

## 2024-07-04 DIAGNOSIS — M1990 Unspecified osteoarthritis, unspecified site: Secondary | ICD-10-CM | POA: Insufficient documentation

## 2024-07-04 DIAGNOSIS — D509 Iron deficiency anemia, unspecified: Secondary | ICD-10-CM

## 2024-07-04 DIAGNOSIS — Z7722 Contact with and (suspected) exposure to environmental tobacco smoke (acute) (chronic): Secondary | ICD-10-CM | POA: Diagnosis not present

## 2024-07-04 LAB — CBC WITH DIFFERENTIAL (CANCER CENTER ONLY)
Abs Immature Granulocytes: 0.06 K/uL (ref 0.00–0.07)
Basophils Absolute: 0 K/uL (ref 0.0–0.1)
Basophils Relative: 0 %
Eosinophils Absolute: 0.1 K/uL (ref 0.0–0.5)
Eosinophils Relative: 1 %
HCT: 34.7 % — ABNORMAL LOW (ref 36.0–46.0)
Hemoglobin: 12 g/dL (ref 12.0–15.0)
Immature Granulocytes: 1 %
Lymphocytes Relative: 18 %
Lymphs Abs: 1.1 K/uL (ref 0.7–4.0)
MCH: 28 pg (ref 26.0–34.0)
MCHC: 34.6 g/dL (ref 30.0–36.0)
MCV: 81.1 fL (ref 80.0–100.0)
Monocytes Absolute: 0.5 K/uL (ref 0.1–1.0)
Monocytes Relative: 8 %
Neutro Abs: 4.5 K/uL (ref 1.7–7.7)
Neutrophils Relative %: 72 %
Platelet Count: 117 K/uL — ABNORMAL LOW (ref 150–400)
RBC: 4.28 MIL/uL (ref 3.87–5.11)
RDW: 13.9 % (ref 11.5–15.5)
WBC Count: 6.2 K/uL (ref 4.0–10.5)
nRBC: 0 % (ref 0.0–0.2)

## 2024-07-04 LAB — CMP (CANCER CENTER ONLY)
ALT: 25 U/L (ref 0–44)
AST: 24 U/L (ref 15–41)
Albumin: 4.1 g/dL (ref 3.5–5.0)
Alkaline Phosphatase: 85 U/L (ref 38–126)
Anion gap: 9 (ref 5–15)
BUN: 10 mg/dL (ref 6–20)
CO2: 27 mmol/L (ref 22–32)
Calcium: 9.3 mg/dL (ref 8.9–10.3)
Chloride: 105 mmol/L (ref 98–111)
Creatinine: 0.66 mg/dL (ref 0.44–1.00)
GFR, Estimated: >60 mL/min (ref >60)
Glucose, Bld: 119 mg/dL — ABNORMAL HIGH (ref 70–99)
Potassium: 3.9 mmol/L (ref 3.5–5.1)
Sodium: 141 mmol/L (ref 135–145)
Total Bilirubin: 0.3 mg/dL (ref 0.0–1.2)
Total Protein: 7.6 g/dL (ref 6.5–8.1)

## 2024-07-04 LAB — IRON AND IRON BINDING CAPACITY (CC-WL,HP ONLY)
Iron: 81 ug/dL (ref 28–170)
Saturation Ratios: 22 % (ref 10.4–31.8)
TIBC: 375 ug/dL (ref 250–450)
UIBC: 294 ug/dL

## 2024-07-04 LAB — FERRITIN: Ferritin: 120 ng/mL (ref 11–307)

## 2024-07-06 ENCOUNTER — Inpatient Hospital Stay: Admitting: Hematology

## 2024-07-06 NOTE — Progress Notes (Signed)
 Patient was not contactable for phone visit and will be rescheduled.  .    Latest Ref Rng & Units 07/04/2024    7:58 AM 03/07/2024   11:54 AM 11/21/2023    8:00 AM  CBC  WBC 4.0 - 10.5 K/uL 6.2  5.0  7.7   Hemoglobin 12.0 - 15.0 g/dL 87.9  86.6  87.4   Hematocrit 36.0 - 46.0 % 34.7  38.7  36.0   Platelets 150 - 400 K/uL 117  113  115    PLT stable on promacta .  This encounter was created in error - please disregard.

## 2024-07-16 ENCOUNTER — Other Ambulatory Visit: Payer: Self-pay

## 2024-07-18 ENCOUNTER — Other Ambulatory Visit (HOSPITAL_COMMUNITY): Payer: Self-pay

## 2024-07-20 ENCOUNTER — Other Ambulatory Visit: Payer: Self-pay

## 2024-08-07 ENCOUNTER — Other Ambulatory Visit: Payer: Self-pay

## 2024-08-07 NOTE — Progress Notes (Signed)
 Specialty Pharmacy Refill Coordination Note  Kiara Barrera  is a 49 y.o. female contacted today regarding refills of specialty medication(s) Eltrombopag  Olamine (Promacta )   Patient requested Pickup at [facility] [place] date: 08/07/24   Medication will be filled on: 08/07/24

## 2024-09-04 ENCOUNTER — Inpatient Hospital Stay: Attending: Hematology

## 2024-09-04 ENCOUNTER — Inpatient Hospital Stay: Admitting: Hematology
